# Patient Record
Sex: Female | Born: 1960 | Race: Black or African American | Hispanic: No | Marital: Married | Smoking: Never smoker
Health system: Southern US, Community
[De-identification: ages and names within clinical notes are randomized; demographics above are authoritative.]

## PROBLEM LIST (undated history)

## (undated) DIAGNOSIS — IMO0001 Reserved for inherently not codable concepts without codable children: Secondary | ICD-10-CM

## (undated) DIAGNOSIS — R51 Headache: Secondary | ICD-10-CM

## (undated) DIAGNOSIS — M129 Arthropathy, unspecified: Secondary | ICD-10-CM

## (undated) DIAGNOSIS — D509 Iron deficiency anemia, unspecified: Secondary | ICD-10-CM

## (undated) DIAGNOSIS — G4700 Insomnia, unspecified: Secondary | ICD-10-CM

## (undated) DIAGNOSIS — M545 Low back pain: Secondary | ICD-10-CM

## (undated) DIAGNOSIS — I10 Essential (primary) hypertension: Secondary | ICD-10-CM

## (undated) DIAGNOSIS — J019 Acute sinusitis, unspecified: Secondary | ICD-10-CM

## (undated) DIAGNOSIS — G039 Meningitis, unspecified: Secondary | ICD-10-CM

## (undated) DIAGNOSIS — E559 Vitamin D deficiency, unspecified: Secondary | ICD-10-CM

## (undated) DIAGNOSIS — F329 Major depressive disorder, single episode, unspecified: Secondary | ICD-10-CM

## (undated) DIAGNOSIS — M810 Age-related osteoporosis without current pathological fracture: Secondary | ICD-10-CM

## (undated) DIAGNOSIS — E785 Hyperlipidemia, unspecified: Secondary | ICD-10-CM

## (undated) DIAGNOSIS — R21 Rash and other nonspecific skin eruption: Secondary | ICD-10-CM

## (undated) DIAGNOSIS — G8929 Other chronic pain: Secondary | ICD-10-CM

## (undated) DIAGNOSIS — M2550 Pain in unspecified joint: Secondary | ICD-10-CM

## (undated) DIAGNOSIS — B029 Zoster without complications: Secondary | ICD-10-CM

## (undated) DIAGNOSIS — F411 Generalized anxiety disorder: Secondary | ICD-10-CM

## (undated) HISTORY — DX: Generalized anxiety disorder: F41.1

## (undated) HISTORY — DX: Arthropathy, unspecified: M12.9

## (undated) HISTORY — DX: Reserved for inherently not codable concepts without codable children: IMO0001

## (undated) HISTORY — DX: Iron deficiency anemia, unspecified: D50.9

## (undated) HISTORY — DX: Pain in unspecified joint: M25.50

## (undated) HISTORY — PX: OTHER SURGICAL HISTORY: SHX169

## (undated) HISTORY — DX: Low back pain: M54.5

## (undated) HISTORY — DX: Other chronic pain: G89.29

## (undated) HISTORY — DX: Headache: R51

## (undated) HISTORY — DX: Rash and other nonspecific skin eruption: R21

## (undated) HISTORY — DX: Zoster without complications: B02.9

## (undated) HISTORY — DX: Insomnia, unspecified: G47.00

## (undated) HISTORY — DX: Acute sinusitis, unspecified: J01.90

## (undated) HISTORY — PX: ABDOMINAL HYSTERECTOMY: SHX81

## (undated) HISTORY — DX: Major depressive disorder, single episode, unspecified: F32.9

## (undated) HISTORY — DX: Essential (primary) hypertension: I10

## (undated) HISTORY — DX: Hyperlipidemia, unspecified: E78.5

## (undated) HISTORY — DX: Vitamin D deficiency, unspecified: E55.9

---

## 1998-09-20 ENCOUNTER — Other Ambulatory Visit: Admission: RE | Admit: 1998-09-20 | Discharge: 1998-09-20 | Payer: Self-pay | Admitting: *Deleted

## 1999-01-16 ENCOUNTER — Emergency Department (HOSPITAL_COMMUNITY): Admission: EM | Admit: 1999-01-16 | Discharge: 1999-01-16 | Payer: Self-pay | Admitting: Emergency Medicine

## 1999-01-16 ENCOUNTER — Encounter: Payer: Self-pay | Admitting: Emergency Medicine

## 2000-02-02 ENCOUNTER — Encounter (INDEPENDENT_AMBULATORY_CARE_PROVIDER_SITE_OTHER): Payer: Self-pay | Admitting: Specialist

## 2000-02-02 ENCOUNTER — Inpatient Hospital Stay (HOSPITAL_COMMUNITY): Admission: RE | Admit: 2000-02-02 | Discharge: 2000-02-04 | Payer: Self-pay | Admitting: *Deleted

## 2000-02-14 ENCOUNTER — Encounter: Payer: Self-pay | Admitting: Obstetrics and Gynecology

## 2000-02-14 ENCOUNTER — Inpatient Hospital Stay (HOSPITAL_COMMUNITY): Admission: AD | Admit: 2000-02-14 | Discharge: 2000-02-14 | Payer: Self-pay | Admitting: Obstetrics and Gynecology

## 2000-04-05 ENCOUNTER — Encounter: Payer: Self-pay | Admitting: *Deleted

## 2000-04-05 ENCOUNTER — Ambulatory Visit (HOSPITAL_COMMUNITY): Admission: RE | Admit: 2000-04-05 | Discharge: 2000-04-05 | Payer: Self-pay | Admitting: *Deleted

## 2000-05-14 ENCOUNTER — Encounter: Admission: RE | Admit: 2000-05-14 | Discharge: 2000-05-14 | Payer: Self-pay | Admitting: Family Medicine

## 2000-05-14 ENCOUNTER — Encounter: Payer: Self-pay | Admitting: Family Medicine

## 2000-09-27 ENCOUNTER — Encounter: Admission: RE | Admit: 2000-09-27 | Discharge: 2000-09-27 | Payer: Self-pay | Admitting: Family Medicine

## 2000-09-27 ENCOUNTER — Encounter: Payer: Self-pay | Admitting: Family Medicine

## 2000-09-28 ENCOUNTER — Encounter: Payer: Self-pay | Admitting: Family Medicine

## 2000-12-26 ENCOUNTER — Encounter: Admission: RE | Admit: 2000-12-26 | Discharge: 2000-12-26 | Payer: Self-pay | Admitting: Family Medicine

## 2000-12-26 ENCOUNTER — Encounter: Payer: Self-pay | Admitting: Family Medicine

## 2001-12-12 ENCOUNTER — Encounter: Payer: Self-pay | Admitting: Emergency Medicine

## 2001-12-12 ENCOUNTER — Emergency Department (HOSPITAL_COMMUNITY): Admission: EM | Admit: 2001-12-12 | Discharge: 2001-12-12 | Payer: Self-pay | Admitting: Emergency Medicine

## 2001-12-19 ENCOUNTER — Ambulatory Visit (HOSPITAL_COMMUNITY): Admission: RE | Admit: 2001-12-19 | Discharge: 2001-12-19 | Payer: Self-pay | Admitting: *Deleted

## 2001-12-19 ENCOUNTER — Encounter: Payer: Self-pay | Admitting: *Deleted

## 2002-09-01 ENCOUNTER — Emergency Department (HOSPITAL_COMMUNITY): Admission: EM | Admit: 2002-09-01 | Discharge: 2002-09-01 | Payer: Self-pay | Admitting: Emergency Medicine

## 2002-11-27 HISTORY — PX: OTHER SURGICAL HISTORY: SHX169

## 2003-03-17 ENCOUNTER — Encounter: Payer: Self-pay | Admitting: Emergency Medicine

## 2003-03-17 ENCOUNTER — Emergency Department (HOSPITAL_COMMUNITY): Admission: EM | Admit: 2003-03-17 | Discharge: 2003-03-17 | Payer: Self-pay | Admitting: Emergency Medicine

## 2003-03-26 ENCOUNTER — Encounter: Admission: RE | Admit: 2003-03-26 | Discharge: 2003-03-26 | Payer: Self-pay | Admitting: Internal Medicine

## 2003-03-31 ENCOUNTER — Ambulatory Visit (HOSPITAL_COMMUNITY): Admission: RE | Admit: 2003-03-31 | Discharge: 2003-03-31 | Payer: Self-pay | Admitting: Internal Medicine

## 2003-03-31 ENCOUNTER — Encounter: Payer: Self-pay | Admitting: Internal Medicine

## 2003-04-11 ENCOUNTER — Encounter: Payer: Self-pay | Admitting: Emergency Medicine

## 2003-04-11 ENCOUNTER — Emergency Department (HOSPITAL_COMMUNITY): Admission: EM | Admit: 2003-04-11 | Discharge: 2003-04-11 | Payer: Self-pay | Admitting: Emergency Medicine

## 2003-04-15 ENCOUNTER — Encounter: Admission: RE | Admit: 2003-04-15 | Discharge: 2003-04-15 | Payer: Self-pay | Admitting: Internal Medicine

## 2003-04-21 ENCOUNTER — Encounter: Payer: Self-pay | Admitting: Internal Medicine

## 2003-04-21 ENCOUNTER — Ambulatory Visit (HOSPITAL_COMMUNITY): Admission: RE | Admit: 2003-04-21 | Discharge: 2003-04-21 | Payer: Self-pay | Admitting: Internal Medicine

## 2003-04-24 ENCOUNTER — Encounter: Admission: RE | Admit: 2003-04-24 | Discharge: 2003-04-24 | Payer: Self-pay | Admitting: Internal Medicine

## 2003-05-19 ENCOUNTER — Ambulatory Visit (HOSPITAL_COMMUNITY): Admission: RE | Admit: 2003-05-19 | Discharge: 2003-05-20 | Payer: Self-pay | Admitting: Neurosurgery

## 2003-05-19 ENCOUNTER — Encounter: Payer: Self-pay | Admitting: Neurosurgery

## 2004-07-29 ENCOUNTER — Encounter: Admission: RE | Admit: 2004-07-29 | Discharge: 2004-07-29 | Payer: Self-pay | Admitting: Family Medicine

## 2004-09-26 ENCOUNTER — Emergency Department (HOSPITAL_COMMUNITY): Admission: EM | Admit: 2004-09-26 | Discharge: 2004-09-26 | Payer: Self-pay | Admitting: Emergency Medicine

## 2005-10-27 ENCOUNTER — Encounter: Payer: Self-pay | Admitting: Internal Medicine

## 2005-10-27 LAB — CONVERTED CEMR LAB: Pap Smear: NORMAL

## 2005-11-21 ENCOUNTER — Encounter: Admission: RE | Admit: 2005-11-21 | Discharge: 2005-11-21 | Payer: Self-pay | Admitting: Orthopedic Surgery

## 2005-12-20 ENCOUNTER — Encounter: Admission: RE | Admit: 2005-12-20 | Discharge: 2005-12-20 | Payer: Self-pay | Admitting: Orthopedic Surgery

## 2006-11-21 ENCOUNTER — Encounter: Admission: RE | Admit: 2006-11-21 | Discharge: 2006-11-21 | Payer: Self-pay | Admitting: Internal Medicine

## 2006-11-27 HISTORY — PX: LUMBAR FUSION: SHX111

## 2006-12-26 ENCOUNTER — Encounter (INDEPENDENT_AMBULATORY_CARE_PROVIDER_SITE_OTHER): Payer: Self-pay | Admitting: Specialist

## 2006-12-26 ENCOUNTER — Inpatient Hospital Stay (HOSPITAL_COMMUNITY): Admission: RE | Admit: 2006-12-26 | Discharge: 2006-12-31 | Payer: Self-pay | Admitting: Orthopedic Surgery

## 2007-09-30 ENCOUNTER — Encounter: Payer: Self-pay | Admitting: Internal Medicine

## 2007-10-25 ENCOUNTER — Ambulatory Visit: Payer: Self-pay | Admitting: Internal Medicine

## 2007-10-25 LAB — CONVERTED CEMR LAB
ALT: 32 units/L (ref 0–35)
Bilirubin Urine: NEGATIVE
Bilirubin, Direct: 0.1 mg/dL (ref 0.0–0.3)
CO2: 26 meq/L (ref 19–32)
Creatinine, Ser: 0.8 mg/dL (ref 0.4–1.2)
GFR calc Af Amer: 98 mL/min
Glucose, Bld: 105 mg/dL — ABNORMAL HIGH (ref 70–99)
HCT: 40.7 % (ref 36.0–46.0)
Hemoglobin, Urine: NEGATIVE
Hemoglobin: 14.2 g/dL (ref 12.0–15.0)
Ketones, ur: NEGATIVE mg/dL
MCHC: 34.9 g/dL (ref 30.0–36.0)
MCV: 91 fL (ref 78.0–100.0)
Monocytes Absolute: 0.4 10*3/uL (ref 0.2–0.7)
Monocytes Relative: 6.6 % (ref 3.0–11.0)
Neutrophils Relative %: 45.5 % (ref 43.0–77.0)
Potassium: 4.1 meq/L (ref 3.5–5.1)
RBC: 4.47 M/uL (ref 3.87–5.11)
TSH: 0.97 microintl units/mL (ref 0.35–5.50)
Total Bilirubin: 0.6 mg/dL (ref 0.3–1.2)
Total Protein: 7.7 g/dL (ref 6.0–8.3)
Urobilinogen, UA: 0.2 (ref 0.0–1.0)
VLDL: 11 mg/dL (ref 0–40)

## 2007-10-31 ENCOUNTER — Ambulatory Visit: Payer: Self-pay | Admitting: Internal Medicine

## 2007-10-31 ENCOUNTER — Encounter: Payer: Self-pay | Admitting: Internal Medicine

## 2007-10-31 DIAGNOSIS — F329 Major depressive disorder, single episode, unspecified: Secondary | ICD-10-CM | POA: Insufficient documentation

## 2007-10-31 DIAGNOSIS — M810 Age-related osteoporosis without current pathological fracture: Secondary | ICD-10-CM

## 2007-10-31 DIAGNOSIS — M797 Fibromyalgia: Secondary | ICD-10-CM | POA: Insufficient documentation

## 2007-10-31 DIAGNOSIS — M949 Disorder of cartilage, unspecified: Secondary | ICD-10-CM

## 2007-10-31 DIAGNOSIS — M129 Arthropathy, unspecified: Secondary | ICD-10-CM | POA: Insufficient documentation

## 2007-10-31 DIAGNOSIS — M2550 Pain in unspecified joint: Secondary | ICD-10-CM | POA: Insufficient documentation

## 2007-10-31 DIAGNOSIS — F3289 Other specified depressive episodes: Secondary | ICD-10-CM

## 2007-10-31 DIAGNOSIS — F32A Depression, unspecified: Secondary | ICD-10-CM | POA: Insufficient documentation

## 2007-10-31 DIAGNOSIS — M545 Low back pain, unspecified: Secondary | ICD-10-CM

## 2007-10-31 DIAGNOSIS — D509 Iron deficiency anemia, unspecified: Secondary | ICD-10-CM | POA: Insufficient documentation

## 2007-10-31 DIAGNOSIS — I10 Essential (primary) hypertension: Secondary | ICD-10-CM | POA: Insufficient documentation

## 2007-10-31 DIAGNOSIS — M899 Disorder of bone, unspecified: Secondary | ICD-10-CM | POA: Insufficient documentation

## 2007-10-31 DIAGNOSIS — E785 Hyperlipidemia, unspecified: Secondary | ICD-10-CM

## 2007-10-31 DIAGNOSIS — IMO0001 Reserved for inherently not codable concepts without codable children: Secondary | ICD-10-CM

## 2007-10-31 HISTORY — DX: Iron deficiency anemia, unspecified: D50.9

## 2007-10-31 HISTORY — DX: Low back pain, unspecified: M54.50

## 2007-10-31 HISTORY — DX: Major depressive disorder, single episode, unspecified: F32.9

## 2007-10-31 HISTORY — DX: Other specified depressive episodes: F32.89

## 2007-10-31 HISTORY — DX: Age-related osteoporosis without current pathological fracture: M81.0

## 2007-10-31 HISTORY — DX: Pain in unspecified joint: M25.50

## 2007-10-31 HISTORY — DX: Arthropathy, unspecified: M12.9

## 2007-10-31 HISTORY — DX: Hyperlipidemia, unspecified: E78.5

## 2007-10-31 HISTORY — DX: Essential (primary) hypertension: I10

## 2007-10-31 HISTORY — DX: Reserved for inherently not codable concepts without codable children: IMO0001

## 2007-10-31 LAB — CONVERTED CEMR LAB
Rhuematoid fact SerPl-aCnc: <20 intl units/mL — ABNORMAL LOW (ref 0.0–20.0)
Sed Rate: 32 mm/hr — ABNORMAL HIGH (ref 0–25)

## 2007-11-02 LAB — CONVERTED CEMR LAB: Anti Nuclear Antibody(ANA): POSITIVE — AB

## 2007-11-04 ENCOUNTER — Encounter: Admission: RE | Admit: 2007-11-04 | Discharge: 2007-11-04 | Payer: Self-pay | Admitting: Orthopedic Surgery

## 2008-01-21 ENCOUNTER — Ambulatory Visit: Payer: Self-pay | Admitting: Internal Medicine

## 2008-01-21 LAB — CONVERTED CEMR LAB
ALT: 24 units/L (ref 0–35)
AST: 19 units/L (ref 0–37)
BUN: 6 mg/dL (ref 6–23)
Calcium: 9.2 mg/dL (ref 8.4–10.5)
Creatinine, Ser: 0.7 mg/dL (ref 0.4–1.2)
GFR calc non Af Amer: 95 mL/min
Total Bilirubin: 0.8 mg/dL (ref 0.3–1.2)
Total CHOL/HDL Ratio: 2.4
Triglycerides: 41 mg/dL (ref 0–149)
VLDL: 8 mg/dL (ref 0–40)

## 2008-01-24 ENCOUNTER — Ambulatory Visit: Payer: Self-pay | Admitting: Internal Medicine

## 2008-01-24 ENCOUNTER — Encounter (INDEPENDENT_AMBULATORY_CARE_PROVIDER_SITE_OTHER): Payer: Self-pay | Admitting: *Deleted

## 2008-01-27 ENCOUNTER — Encounter: Payer: Self-pay | Admitting: Internal Medicine

## 2008-02-17 ENCOUNTER — Encounter: Payer: Self-pay | Admitting: Internal Medicine

## 2008-03-19 ENCOUNTER — Encounter: Payer: Self-pay | Admitting: Internal Medicine

## 2008-03-30 ENCOUNTER — Encounter: Payer: Self-pay | Admitting: Internal Medicine

## 2008-04-03 ENCOUNTER — Encounter: Payer: Self-pay | Admitting: Internal Medicine

## 2008-04-25 ENCOUNTER — Encounter: Admission: RE | Admit: 2008-04-25 | Discharge: 2008-04-25 | Payer: Self-pay | Admitting: Rheumatology

## 2008-04-30 ENCOUNTER — Encounter: Payer: Self-pay | Admitting: Internal Medicine

## 2008-10-23 ENCOUNTER — Ambulatory Visit: Payer: Self-pay | Admitting: Internal Medicine

## 2008-10-23 ENCOUNTER — Encounter: Payer: Self-pay | Admitting: Internal Medicine

## 2008-12-11 ENCOUNTER — Ambulatory Visit: Payer: Self-pay | Admitting: Internal Medicine

## 2008-12-11 LAB — CONVERTED CEMR LAB
Alkaline Phosphatase: 55 units/L (ref 39–117)
BUN: 9 mg/dL (ref 6–23)
Bilirubin Urine: NEGATIVE
Bilirubin, Direct: 0.1 mg/dL (ref 0.0–0.3)
CO2: 30 meq/L (ref 19–32)
Cholesterol: 204 mg/dL (ref 0–200)
Eosinophils Relative: 1.1 % (ref 0.0–5.0)
GFR calc Af Amer: 98 mL/min
GFR calc non Af Amer: 81 mL/min
Glucose, Bld: 96 mg/dL (ref 70–99)
HCT: 39 % (ref 36.0–46.0)
Hemoglobin: 13.8 g/dL (ref 12.0–15.0)
Ketones, ur: NEGATIVE mg/dL
Lymphocytes Relative: 38.1 % (ref 12.0–46.0)
Monocytes Absolute: 0.2 10*3/uL (ref 0.1–1.0)
Monocytes Relative: 4.2 % (ref 3.0–12.0)
Platelets: 185 10*3/uL (ref 150–400)
Potassium: 4 meq/L (ref 3.5–5.1)
RBC: 4.31 M/uL (ref 3.87–5.11)
RDW: 11.9 % (ref 11.5–14.6)
Sodium: 141 meq/L (ref 135–145)
Specific Gravity, Urine: 1.015 (ref 1.000–1.03)
Total CHOL/HDL Ratio: 3.3
Triglycerides: 52 mg/dL (ref 0–149)
Urine Glucose: NEGATIVE mg/dL
VLDL: 10 mg/dL (ref 0–40)
WBC: 5.6 10*3/uL (ref 4.5–10.5)
pH: 7 (ref 5.0–8.0)

## 2008-12-15 ENCOUNTER — Ambulatory Visit: Payer: Self-pay | Admitting: Internal Medicine

## 2009-01-06 ENCOUNTER — Ambulatory Visit: Payer: Self-pay | Admitting: Gastroenterology

## 2009-01-25 ENCOUNTER — Telehealth: Payer: Self-pay | Admitting: Gastroenterology

## 2009-01-26 ENCOUNTER — Ambulatory Visit: Payer: Self-pay | Admitting: Gastroenterology

## 2009-01-26 LAB — HM COLONOSCOPY

## 2009-02-09 ENCOUNTER — Ambulatory Visit: Payer: Self-pay | Admitting: Internal Medicine

## 2009-02-10 ENCOUNTER — Encounter: Admission: RE | Admit: 2009-02-10 | Discharge: 2009-02-10 | Payer: Self-pay | Admitting: Internal Medicine

## 2009-02-15 ENCOUNTER — Encounter: Admission: RE | Admit: 2009-02-15 | Discharge: 2009-02-15 | Payer: Self-pay | Admitting: Internal Medicine

## 2009-02-17 ENCOUNTER — Encounter: Admission: RE | Admit: 2009-02-17 | Discharge: 2009-02-17 | Payer: Self-pay | Admitting: Internal Medicine

## 2009-02-26 ENCOUNTER — Encounter: Payer: Self-pay | Admitting: Internal Medicine

## 2009-02-26 ENCOUNTER — Encounter: Admission: RE | Admit: 2009-02-26 | Discharge: 2009-02-26 | Payer: Self-pay | Admitting: Internal Medicine

## 2009-02-26 ENCOUNTER — Encounter (INDEPENDENT_AMBULATORY_CARE_PROVIDER_SITE_OTHER): Payer: Self-pay | Admitting: Radiology

## 2009-02-26 HISTORY — PX: BREAST BIOPSY: SHX20

## 2009-03-01 ENCOUNTER — Encounter: Payer: Self-pay | Admitting: Internal Medicine

## 2009-07-12 ENCOUNTER — Ambulatory Visit: Payer: Self-pay | Admitting: Internal Medicine

## 2009-07-12 DIAGNOSIS — R51 Headache: Secondary | ICD-10-CM | POA: Insufficient documentation

## 2009-07-12 DIAGNOSIS — R519 Headache, unspecified: Secondary | ICD-10-CM | POA: Insufficient documentation

## 2009-07-12 HISTORY — DX: Headache: R51

## 2009-07-12 LAB — CONVERTED CEMR LAB
ALT: 18 units/L (ref 0–35)
Albumin: 3.8 g/dL (ref 3.5–5.2)
Basophils Absolute: 0.4 10*3/uL — ABNORMAL HIGH (ref 0.0–0.1)
Basophils Relative: 4.1 % — ABNORMAL HIGH (ref 0.0–3.0)
CO2: 29 meq/L (ref 19–32)
Calcium: 9.4 mg/dL (ref 8.4–10.5)
Chloride: 106 meq/L (ref 96–112)
Creatinine, Ser: 0.8 mg/dL (ref 0.4–1.2)
Eosinophils Absolute: 0.1 10*3/uL (ref 0.0–0.7)
Glucose, Bld: 106 mg/dL — ABNORMAL HIGH (ref 70–99)
Hemoglobin: 13.9 g/dL (ref 12.0–15.0)
MCHC: 35.6 g/dL (ref 30.0–36.0)
MCV: 91.2 fL (ref 78.0–100.0)
Monocytes Absolute: 0.3 10*3/uL (ref 0.1–1.0)
Neutro Abs: 5 10*3/uL (ref 1.4–7.7)
Potassium: 3.8 meq/L (ref 3.5–5.1)
RBC: 4.29 M/uL (ref 3.87–5.11)
RDW: 12.2 % (ref 11.5–14.6)
Saturation Ratios: 20.1 % (ref 20.0–50.0)
Total Protein: 7.7 g/dL (ref 6.0–8.3)
Vit D, 25-Hydroxy: 24 ng/mL — ABNORMAL LOW (ref 30–89)
Vitamin B-12: 394 pg/mL (ref 211–911)

## 2009-07-13 ENCOUNTER — Telehealth: Payer: Self-pay | Admitting: Internal Medicine

## 2009-07-28 ENCOUNTER — Ambulatory Visit: Payer: Self-pay | Admitting: Internal Medicine

## 2009-07-28 DIAGNOSIS — E559 Vitamin D deficiency, unspecified: Secondary | ICD-10-CM

## 2009-07-28 HISTORY — DX: Vitamin D deficiency, unspecified: E55.9

## 2009-11-09 ENCOUNTER — Ambulatory Visit: Payer: Self-pay | Admitting: Internal Medicine

## 2009-11-09 DIAGNOSIS — B029 Zoster without complications: Secondary | ICD-10-CM

## 2009-11-09 HISTORY — DX: Zoster without complications: B02.9

## 2009-11-27 HISTORY — PX: OTHER SURGICAL HISTORY: SHX169

## 2009-12-20 ENCOUNTER — Ambulatory Visit: Payer: Self-pay | Admitting: Internal Medicine

## 2009-12-20 DIAGNOSIS — J019 Acute sinusitis, unspecified: Secondary | ICD-10-CM

## 2009-12-20 HISTORY — DX: Acute sinusitis, unspecified: J01.90

## 2010-01-13 ENCOUNTER — Ambulatory Visit: Payer: Self-pay | Admitting: Internal Medicine

## 2010-01-13 LAB — CONVERTED CEMR LAB
ALT: 23 units/L (ref 0–35)
Bilirubin Urine: NEGATIVE
Bilirubin, Direct: 0.1 mg/dL (ref 0.0–0.3)
CO2: 30 meq/L (ref 19–32)
Eosinophils Relative: 1.7 % (ref 0.0–5.0)
Glucose, Bld: 98 mg/dL (ref 70–99)
HDL: 68.6 mg/dL (ref >39.00)
MCV: 94.2 fL (ref 78.0–100.0)
Monocytes Absolute: 0.4 10*3/uL (ref 0.1–1.0)
Monocytes Relative: 5.8 % (ref 3.0–12.0)
Neutrophils Relative %: 53.6 % (ref 43.0–77.0)
Platelets: 200 10*3/uL (ref 150.0–400.0)
Potassium: 4.2 meq/L (ref 3.5–5.1)
Sodium: 141 meq/L (ref 135–145)
Specific Gravity, Urine: 1.02 (ref 1.000–1.030)
Total Bilirubin: 0.6 mg/dL (ref 0.3–1.2)
Total CHOL/HDL Ratio: 2
Total Protein, Urine: NEGATIVE mg/dL
VLDL: 10 mg/dL (ref 0.0–40.0)
WBC: 6.5 10*3/uL (ref 4.5–10.5)
pH: 6.5 (ref 5.0–8.0)

## 2010-01-19 ENCOUNTER — Ambulatory Visit: Payer: Self-pay | Admitting: Internal Medicine

## 2010-02-16 ENCOUNTER — Encounter: Admission: RE | Admit: 2010-02-16 | Discharge: 2010-02-16 | Payer: Self-pay | Admitting: Internal Medicine

## 2010-02-16 LAB — HM MAMMOGRAPHY

## 2010-06-02 ENCOUNTER — Ambulatory Visit: Payer: Self-pay | Admitting: Internal Medicine

## 2010-06-02 ENCOUNTER — Telehealth: Payer: Self-pay | Admitting: Internal Medicine

## 2010-06-02 DIAGNOSIS — R21 Rash and other nonspecific skin eruption: Secondary | ICD-10-CM

## 2010-06-02 DIAGNOSIS — G4700 Insomnia, unspecified: Secondary | ICD-10-CM | POA: Insufficient documentation

## 2010-06-02 HISTORY — DX: Rash and other nonspecific skin eruption: R21

## 2010-06-02 HISTORY — DX: Insomnia, unspecified: G47.00

## 2010-12-19 ENCOUNTER — Encounter: Payer: Self-pay | Admitting: Internal Medicine

## 2010-12-27 ENCOUNTER — Other Ambulatory Visit: Payer: Self-pay | Admitting: Internal Medicine

## 2010-12-27 ENCOUNTER — Encounter: Payer: Self-pay | Admitting: Internal Medicine

## 2010-12-27 ENCOUNTER — Ambulatory Visit
Admission: RE | Admit: 2010-12-27 | Discharge: 2010-12-27 | Payer: Self-pay | Source: Home / Self Care | Attending: Internal Medicine | Admitting: Internal Medicine

## 2010-12-27 DIAGNOSIS — F411 Generalized anxiety disorder: Secondary | ICD-10-CM | POA: Insufficient documentation

## 2010-12-27 DIAGNOSIS — R51 Headache: Secondary | ICD-10-CM

## 2010-12-27 HISTORY — DX: Generalized anxiety disorder: F41.1

## 2010-12-27 LAB — LIPID PANEL
Cholesterol: 190 mg/dL (ref 0–200)
HDL: 72.4 mg/dL (ref >39.00)
LDL Cholesterol: 105 mg/dL — ABNORMAL HIGH (ref 0–99)
VLDL: 13 mg/dL (ref 0.0–40.0)

## 2010-12-27 LAB — CBC WITH DIFFERENTIAL/PLATELET
Basophils Absolute: 0 10*3/uL (ref 0.0–0.1)
Eosinophils Absolute: 0.1 10*3/uL (ref 0.0–0.7)
HCT: 39.7 % (ref 36.0–46.0)
Hemoglobin: 13.8 g/dL (ref 12.0–15.0)
Lymphocytes Relative: 42.1 % (ref 12.0–46.0)
Lymphs Abs: 2.1 10*3/uL (ref 0.7–4.0)
MCHC: 34.8 g/dL (ref 30.0–36.0)
MCV: 91.9 fl (ref 78.0–100.0)
Monocytes Relative: 5.9 % (ref 3.0–12.0)
Neutro Abs: 2.5 10*3/uL (ref 1.4–7.7)
Platelets: 214 10*3/uL (ref 150.0–400.0)
RBC: 4.32 Mil/uL (ref 3.87–5.11)
RDW: 13.1 % (ref 11.5–14.6)
WBC: 5 10*3/uL (ref 4.5–10.5)

## 2010-12-27 LAB — URINALYSIS
Bilirubin Urine: NEGATIVE
Hemoglobin, Urine: NEGATIVE
Ketones, ur: NEGATIVE
Total Protein, Urine: NEGATIVE
Urine Glucose: NEGATIVE
pH: 7.5 (ref 5.0–8.0)

## 2010-12-27 LAB — HEPATIC FUNCTION PANEL
AST: 18 U/L (ref 0–37)
Albumin: 3.9 g/dL (ref 3.5–5.2)
Total Bilirubin: 0.6 mg/dL (ref 0.3–1.2)

## 2010-12-27 LAB — BASIC METABOLIC PANEL
Chloride: 104 mEq/L (ref 96–112)
Creatinine, Ser: 0.9 mg/dL (ref 0.4–1.2)
Potassium: 4.1 mEq/L (ref 3.5–5.1)
Sodium: 140 mEq/L (ref 135–145)

## 2010-12-27 NOTE — Assessment & Plan Note (Signed)
Summary: RASH/NWS   Vital Signs:  Patient profile:   50 year old female Height:      64.5 inches Weight:      193.50 pounds BMI:     32.82 O2 Sat:      98 % on Room air Temp:     98.3 degrees F oral Pulse rate:   78 / minute BP sitting:   160 / 100  (left arm) Cuff size:   regular  Vitals Entered By: Zella Ball Ewing CMA Duncan Dull) (June 02, 2010 2:57 PM)  O2 Flow:  Room air CC: Rash/RE   Primary Care Provider:  Corwin Levins MD  CC:  Rash/RE.  History of Present Illness: here to f/u, also with husband - both with rash it seems with severe itch and spreading over the arms, after contact exposure to duaghter with rash last evening; no fever, pain, swelling.   BP at home usually > 140 sbp  Pt denies CP, sob, doe, wheezing, orthopnea, pnd, worsening LE edema, palps, dizziness or syncope  Pt denies new neuro symptoms such as headache, facial or extremity weakness   Lunesta worked well for insomnia - requests refill.  Denies worsening depressive symtpoms or suicidal ideaiton, or panic.  Overall pain level no change.   Problems Prior to Update: 1)  Sinusitis- Acute-nos  (ICD-461.9) 2)  Shingles  (ICD-053.9) 3)  Vitamin D Deficiency  (ICD-268.9) 4)  Headache  (ICD-784.0) 5)  Preventive Health Care  (ICD-V70.0) 6)  Hypertension  (ICD-401.9) 7)  Preventive Health Care  (ICD-V70.0) 8)  Polyarthralgia  (ICD-719.49) 9)  Family History of Alcoholism/addiction  (ICD-V61.41) 10)  Low Back Pain  (ICD-724.2) 11)  Osteopenia  (ICD-733.90) 12)  Fibromyalgia  (ICD-729.1) 13)  Anemia-iron Deficiency  (ICD-280.9) 14)  Hyperlipidemia  (ICD-272.4) 15)  Depression  (ICD-311) 16)  Arthritis  (ICD-716.90) 17)  Routine General Medical Exam@[facility]  (ICD-V70.0)  Medications Prior to Update: 1)  Hydrocodone-Acetaminophen 5-325 Mg Tabs (Hydrocodone-Acetaminophen) .Marland Kitchen.. 1po Q 6 Hrs As Needed Pain 2)  Benazepril Hcl 10 Mg Tabs (Benazepril Hcl) .Marland Kitchen.. 1po Once Daily 3)  Adult Aspirin Ec Low Strength  81 Mg Tbec (Aspirin) .Marland Kitchen.. 1po Once Daily 4)  Lunesta 3 Mg Tabs (Eszopiclone) .... One By Mouth At Bedtime For Insomnia 5)  Ergocalciferol 50000 Unit Caps (Ergocalciferol) .... One By Mouth Weekly 6)  Ibuprofen 800 Mg Tabs (Ibuprofen) .... One By Mouth Three Times A Day As Needed For Pain 7)  Doxycycline Hyclate 100 Mg Caps (Doxycycline Hyclate) .Marland Kitchen.. 1 By Mouth Two Times A Day 8)  Simvastatin 20 Mg Tabs (Simvastatin) .... 1/2 Po Once Daily  Current Medications (verified): 1)  Hydrocodone-Acetaminophen 5-325 Mg Tabs (Hydrocodone-Acetaminophen) .Marland Kitchen.. 1po Q 6 Hrs As Needed Pain 2)  Benazepril Hcl 20 Mg Tabs (Benazepril Hcl) .Marland Kitchen.. 1 By Mouth Once Daily 3)  Adult Aspirin Ec Low Strength 81 Mg Tbec (Aspirin) .Marland Kitchen.. 1po Once Daily 4)  Lunesta 3 Mg Tabs (Eszopiclone) .... One By Mouth At Bedtime For Insomnia 5)  Ergocalciferol 50000 Unit Caps (Ergocalciferol) .... One By Mouth Weekly 6)  Ibuprofen 800 Mg Tabs (Ibuprofen) .... One By Mouth Three Times A Day As Needed For Pain 7)  Simvastatin 20 Mg Tabs (Simvastatin) .... 1/2 Po Once Daily 8)  Prednisone 10 Mg Tabs (Prednisone) .... 3po Qd For 3days, Then 2po Qd For 3days, Then 1po Qd For 3days, Then Stop 9)  Triamcinolone Acetonide 0.1 % Crea (Triamcinolone Acetonide) .... Use Asd Two Times A Day As Needed  Allergies (verified): 1)  ! Morphine 2)  ! Demerol 3)  ! Pcn 4)  ! Lodine 5)  ! * Etodolac  Past History:  Past Medical History: Last updated: 01/19/2010 Depression DJD Hyperlipidemia Anemia-iron deficiency hx of blood transfusion Fibromyalgia Osteopenia Low back pain - chronic CTS low vit D hx of shingles  Past Surgical History: Last updated: 01/19/2010 Hysterectomy s/p diskectomy 2004 - L5 Lumbar fusion - 2008 s/p right foot morton's neuroma jan 2011  Social History: Last updated: 12/15/2008 Never Smoked Alcohol use-yes Married Drug use-no Regular exercise-no jehovah's witness  Risk Factors: Alcohol Use: 0  (07/12/2009) Exercise: no (10/23/2008)  Risk Factors: Smoking Status: never (07/12/2009) Passive Smoke Exposure: no (10/23/2008)  Review of Systems       all otherwise negative per pt -    Physical Exam  General:  alert and overweight-appearing.   Head:  normocephalic and atraumatic.   Eyes:  vision grossly intact, pupils equal, and pupils round.   Ears:  R ear normal and L ear normal.   Nose:  no external deformity and no nasal discharge.   Mouth:  no gingival abnormalities and pharynx pink and moist.   Neck:  supple and no masses.   Lungs:  normal respiratory effort and normal breath sounds.   Heart:  normal rate and no murmur.   Extremities:  no edema, no erythema  Skin:  diffuse erythem pruritic rash to bilat arms c/w contact derm Psych:  not depressed appearing and moderately anxious.     Impression & Recommendations:  Problem # 1:  RASH-NONVESICULAR (ICD-782.1)  Her updated medication list for this problem includes:    Triamcinolone Acetonide 0.1 % Crea (Triamcinolone acetonide) ..... Use asd two times a day as needed treat as above, f/u any worsening signs or symptoms , and prednisone pack for home  Problem # 2:  HYPERTENSION (ICD-401.9)  Her updated medication list for this problem includes:    Benazepril Hcl 20 Mg Tabs (Benazepril hcl) .Marland Kitchen... 1 by mouth once daily to increase the ACE as above, f/ui BP at home and next visit  BP today: 160/100 Prior BP: 142/86 (01/19/2010)  Prior 10 Yr Risk Heart Disease: 3 % (10/23/2008)  Labs Reviewed: K+: 4.2 (01/13/2010) Creat: : 0.7 (01/13/2010)   Chol: 167 (01/13/2010)   HDL: 68.60 (01/13/2010)   LDL: 88 (01/13/2010)   TG: 50.0 (01/13/2010)  Problem # 3:  INSOMNIA (ICD-780.52)  Her updated medication list for this problem includes:    Lunesta 3 mg treat as above, f/u any worsening signs or symptoms   Complete Medication List: 1)  Hydrocodone-acetaminophen 5-325 Mg Tabs (Hydrocodone-acetaminophen) .Marland Kitchen.. 1po q 6 hrs  as needed pain 2)  Benazepril Hcl 20 Mg Tabs (Benazepril hcl) .Marland Kitchen.. 1 by mouth once daily 3)  Adult Aspirin Ec Low Strength 81 Mg Tbec (Aspirin) .Marland Kitchen.. 1po once daily 4)  Zolpidem Tartrate 10 Mg Tabs (Zolpidem tartrate) .Marland Kitchen.. 1 by mouth at bedtime as needed 5)  Ergocalciferol 50000 Unit Caps (Ergocalciferol) .... One by mouth weekly 6)  Ibuprofen 800 Mg Tabs (Ibuprofen) .... One by mouth three times a day as needed for pain 7)  Simvastatin 20 Mg Tabs (Simvastatin) .... 1/2 po once daily 8)  Prednisone 10 Mg Tabs (Prednisone) .... 3po qd for 3days, then 2po qd for 3days, then 1po qd for 3days, then stop 9)  Triamcinolone Acetonide 0.1 % Crea (Triamcinolone acetonide) .... Use asd two times a day as needed  Patient Instructions: 1)  Please take all new  medications as prescribed 2)  increase the benazepril to 20 mg per day 3)  you are given the lunesta refill today 4)  Continue all previous medications as before this visit 5)  Please schedule a follow-up appointment in jan 2012 with CPX labs Prescriptions: TRIAMCINOLONE ACETONIDE 0.1 % CREA (TRIAMCINOLONE ACETONIDE) use asd two times a day as needed  #1 x 1   Entered and Authorized by:   Corwin Levins MD   Signed by:   Corwin Levins MD on 06/02/2010   Method used:   Print then Give to Patient   RxID:   [id number] LUNESTA 3 MG TABS (ESZOPICLONE) One by mouth at bedtime for insomnia  #30 x 2   Entered and Authorized by:   Corwin Levins MD   Signed by:   Corwin Levins MD on 06/02/2010   Method used:   Print then Give to Patient   RxID:   [id number] PREDNISONE 10 MG TABS (PREDNISONE) 3po qd for 3days, then 2po qd for 3days, then 1po qd for 3days, then stop  #18 x 0   Entered and Authorized by:   Corwin Levins MD   Signed by:   Corwin Levins MD on 06/02/2010   Method used:   Print then Give to Patient   RxID:   [id number] LUNESTA 3 MG TABS (ESZOPICLONE) One by mouth at bedtime for insomnia  #430 x 2   Entered and Authorized by:    Corwin Levins MD   Signed by:   Corwin Levins MD on 06/02/2010   Method used:   Print then Give to Patient   RxID:   [id number] BENAZEPRIL HCL 20 MG TABS (BENAZEPRIL HCL) 1 by mouth once daily  #90 x 3   Entered and Authorized by:   Corwin Levins MD   Signed by:   Corwin Levins MD on 06/02/2010   Method used:   Print then Give to Patient   RxID:   [id number]

## 2010-12-27 NOTE — Assessment & Plan Note (Signed)
Summary: CPX/BCBS/#/CD   Vital Signs:  Patient profile:   50 year old female Height:      64 inches Weight:      188 pounds BMI:     32.39 O2 Sat:      96 % on Room air Temp:     98.6 degrees F oral Pulse rate:   71 / minute BP sitting:   142 / 86  (left arm) Cuff size:   regular  Vitals Entered ByZella Ball Ewing (January 19, 2010 11:11 AM)  O2 Flow:  Room air  Preventive Care Screening  Mammogram:    Date:  01/25/2009    Results:  normal      declines flu shot  CC: Adult Physical/RE   Primary Care Provider:  Corwin Levins MD  CC:  Adult Physical/RE.  History of Present Illness: overall doing "about the same" with chornic FMS symptoms, denies worsening depressive symtpoms, suicidal ideation, or panic.  Pt denies CP, sob, doe, wheezing, orthopnea, pnd, worsening LE edema, palps, dizziness or syncope   Pt denies new neuro symptoms such as headache, facial or extremity weakness BP at home usually < 140/90  Problems Prior to Update: 1)  Sinusitis- Acute-nos  (ICD-461.9) 2)  Shingles  (ICD-053.9) 3)  Vitamin D Deficiency  (ICD-268.9) 4)  Headache  (ICD-784.0) 5)  Preventive Health Care  (ICD-V70.0) 6)  Hypertension  (ICD-401.9) 7)  Preventive Health Care  (ICD-V70.0) 8)  Polyarthralgia  (ICD-719.49) 9)  Family History of Alcoholism/addiction  (ICD-V61.41) 10)  Low Back Pain  (ICD-724.2) 11)  Osteopenia  (ICD-733.90) 12)  Fibromyalgia  (ICD-729.1) 13)  Anemia-iron Deficiency  (ICD-280.9) 14)  Hyperlipidemia  (ICD-272.4) 15)  Depression  (ICD-311) 16)  Arthritis  (ICD-716.90) 17)  Routine General Medical Exam@[facility]  (ICD-V70.0)  Medications Prior to Update: 1)  Hydrocodone-Acetaminophen 5-325 Mg Tabs (Hydrocodone-Acetaminophen) .Marland Kitchen.. 1po Q 6 Hrs As Needed Pain 2)  Benazepril Hcl 10 Mg Tabs (Benazepril Hcl) .Marland Kitchen.. 1po Once Daily 3)  Adult Aspirin Ec Low Strength 81 Mg Tbec (Aspirin) .Marland Kitchen.. 1po Once Daily 4)  Lunesta 3 Mg Tabs (Eszopiclone) .... One By Mouth At  Bedtime For Insomnia 5)  Ergocalciferol 50000 Unit Caps (Ergocalciferol) .... One By Mouth Weekly 6)  Ibuprofen 800 Mg Tabs (Ibuprofen) .... One By Mouth Three Times A Day As Needed For Pain 7)  Doxycycline Hyclate 100 Mg Caps (Doxycycline Hyclate) .Marland Kitchen.. 1 By Mouth Two Times A Day 8)  Simvastatin 20 Mg Tabs (Simvastatin) .Marland Kitchen.. 1po Once Daily  Current Medications (verified): 1)  Hydrocodone-Acetaminophen 5-325 Mg Tabs (Hydrocodone-Acetaminophen) .Marland Kitchen.. 1po Q 6 Hrs As Needed Pain 2)  Benazepril Hcl 10 Mg Tabs (Benazepril Hcl) .Marland Kitchen.. 1po Once Daily 3)  Adult Aspirin Ec Low Strength 81 Mg Tbec (Aspirin) .Marland Kitchen.. 1po Once Daily 4)  Lunesta 3 Mg Tabs (Eszopiclone) .... One By Mouth At Bedtime For Insomnia 5)  Ergocalciferol 50000 Unit Caps (Ergocalciferol) .... One By Mouth Weekly 6)  Ibuprofen 800 Mg Tabs (Ibuprofen) .... One By Mouth Three Times A Day As Needed For Pain 7)  Doxycycline Hyclate 100 Mg Caps (Doxycycline Hyclate) .Marland Kitchen.. 1 By Mouth Two Times A Day 8)  Simvastatin 20 Mg Tabs (Simvastatin) .... 1/2 Po Once Daily  Allergies (verified): 1)  ! Morphine 2)  ! Demerol 3)  ! Pcn 4)  ! Lodine 5)  ! * Etodolac  Past History:  Family History: Last updated: 10/31/2007 Family History High cholesterol DJD daughter with ADHD/depression Family History of Alcoholism/Addiction  Social  History: Last updated: 12/15/2008 Never Smoked Alcohol use-yes Married Drug use-no Regular exercise-no jehovah's witness  Risk Factors: Alcohol Use: 0 (07/12/2009) Exercise: no (10/23/2008)  Risk Factors: Smoking Status: never (07/12/2009) Passive Smoke Exposure: no (10/23/2008)  Past Medical History: Depression DJD Hyperlipidemia Anemia-iron deficiency hx of blood transfusion Fibromyalgia Osteopenia Low back pain - chronic CTS low vit D hx of shingles  Past Surgical History: Hysterectomy s/p diskectomy 2004 - L5 Lumbar fusion - 2008 s/p right foot morton's neuroma jan 2011  Review of  Systems  The patient denies anorexia, fever, weight loss, vision loss, decreased hearing, hoarseness, chest pain, syncope, dyspnea on exertion, peripheral edema, prolonged cough, headaches, hemoptysis, abdominal pain, melena, hematochezia, severe indigestion/heartburn, hematuria, incontinence, suspicious skin lesions, difficulty walking, depression, unusual weight change, abnormal bleeding, enlarged lymph nodes, and angioedema.         all otherwise negative per pt  Physical Exam  General:  alert and well-developed.   Head:  normocephalic and atraumatic.   Eyes:  vision grossly intact, pupils equal, and pupils round.   Ears:  R ear normal and L ear normal.   Nose:  no external deformity and no nasal discharge.   Mouth:  no gingival abnormalities and pharynx pink and moist.   Neck:  supple and no masses.   Lungs:  normal respiratory effort and normal breath sounds.   Heart:  normal rate and regular rhythm.   Abdomen:  soft, non-tender, and normal bowel sounds.   Msk:  no joint tenderness and no joint swelling.   Extremities:  no edema, no erythema  Neurologic:  cranial nerves II-XII intact and strength normal in all extremities.     Impression & Recommendations:  Problem # 1:  Preventive Health Care (ICD-V70.0)  Overall doing well, age appropriate education and counseling updated and referral for appropriate preventive services done unless declined, immunizations up to date or declined, diet counseling done if overweight, urged to quit smoking if smokes , most recent labs reviewed and current ordered if appropriate, ecg reviewed or declined (interpretation per ECG scanned in the EMR if done); information regarding Medicare Prevention requirements given if appropriate  Orders: EKG w/ Interpretation (93000)  Problem # 2:  HYPERTENSION (ICD-401.9)  Her updated medication list for this problem includes:    Benazepril Hcl 10 Mg Tabs (Benazepril hcl) .Marland Kitchen... 1po once daily  BP today:  142/86 Prior BP: 154/98 (12/20/2009)  Prior 10 Yr Risk Heart Disease: 3 % (10/23/2008)  Labs Reviewed: K+: 4.2 (01/13/2010) Creat: : 0.7 (01/13/2010)   Chol: 167 (01/13/2010)   HDL: 68.60 (01/13/2010)   LDL: 88 (01/13/2010)   TG: 50.0 (01/13/2010) stable overall by hx and exam, ok to continue meds/tx as is   Complete Medication List: 1)  Hydrocodone-acetaminophen 5-325 Mg Tabs (Hydrocodone-acetaminophen) .Marland Kitchen.. 1po q 6 hrs as needed pain 2)  Benazepril Hcl 10 Mg Tabs (Benazepril hcl) .Marland Kitchen.. 1po once daily 3)  Adult Aspirin Ec Low Strength 81 Mg Tbec (Aspirin) .Marland Kitchen.. 1po once daily 4)  Lunesta 3 Mg Tabs (Eszopiclone) .... One by mouth at bedtime for insomnia 5)  Ergocalciferol 50000 Unit Caps (Ergocalciferol) .... One by mouth weekly 6)  Ibuprofen 800 Mg Tabs (Ibuprofen) .... One by mouth three times a day as needed for pain 7)  Doxycycline Hyclate 100 Mg Caps (Doxycycline hyclate) .Marland Kitchen.. 1 by mouth two times a day 8)  Simvastatin 20 Mg Tabs (Simvastatin) .... 1/2 po once daily  Patient Instructions: 1)  Continue all previous medications as before this visit  2)  Please schedule a follow-up appointment in 1 year or sooner if needed Prescriptions: ERGOCALCIFEROL 50000 UNIT CAPS (ERGOCALCIFEROL) One by mouth weekly  #4 x 11   Entered and Authorized by:   Corwin Levins MD   Signed by:   Corwin Levins MD on 01/19/2010   Method used:   Print then Give to Patient   RxID:   [id number]

## 2010-12-27 NOTE — Progress Notes (Signed)
Summary: ALT med  Phone Note Call from Patient Call back at Home Phone [phone]   Caller: Patient Summary of Call: Pt called stating that Rx for Lunesta is too expensive and does not come in generic. Pt is requesting alt med. *pt did not specifiy pharmacy* Initial call taken by: Margaret Pyle, CMA,  June 02, 2010 4:49 PM  Follow-up for Phone Call        ok to change to zolpidem - done hardcopy to LIM side B - dahlia   Follow-up by: Corwin Levins MD,  June 02, 2010 4:56 PM  Additional Follow-up for Phone Call Additional follow up Details #1::        Rx faxed to [facility] Additional Follow-up by: Margaret Pyle, CMA,  June 02, 2010 4:59 PM    New/Updated Medications: ZOLPIDEM TARTRATE 10 MG TABS (ZOLPIDEM TARTRATE) 1 by mouth at bedtime as needed Prescriptions: ZOLPIDEM TARTRATE 10 MG TABS (ZOLPIDEM TARTRATE) 1 by mouth at bedtime as needed  #30 x 2   Entered and Authorized by:   Corwin Levins MD   Signed by:   Corwin Levins MD on 06/02/2010   Method used:   Print then Give to Patient   RxID:   [id number]

## 2010-12-27 NOTE — Assessment & Plan Note (Signed)
Summary: STREP/SIDE DOOR/NWS   Vital Signs:  Patient profile:   50 year old female Height:      64 inches Weight:      196 pounds BMI:     33.76 O2 Sat:      97 % on Room air Temp:     97.3 degrees F oral Pulse rate:   84 / minute BP sitting:   154 / 98  (left arm) Cuff size:   regular  Vitals Entered ByMarland Kitchen Zella Ball Ewing (December 20, 2009 1:57 PM)  O2 Flow:  Room air CC: sore throat, head congestion/RE   Primary Care Provider:  Corwin Levins MD  CC:  sore throat and head congestion/RE.  History of Present Illness: here with 3 wks gradually worsening ST, fever, with pain to left ear as well;  and headache and vertigo;  no worsening n/v;  has some mild non prod cough seems worse in the evening;  noticed greenish d/c iwth blood this am;  had some chills last wk , none today; Pt denies CP, sob, doe, wheezing, orthopnea, pnd, worsening LE edema, palps, dizziness or syncope  .  Scheduled to have foot surgury tomorrow.  No one else sick at home.    Problems Prior to Update: 1)  Sinusitis- Acute-nos  (ICD-461.9) 2)  Shingles  (ICD-053.9) 3)  Vitamin D Deficiency  (ICD-268.9) 4)  Headache  (ICD-784.0) 5)  Preventive Health Care  (ICD-V70.0) 6)  Hypertension  (ICD-401.9) 7)  Preventive Health Care  (ICD-V70.0) 8)  Polyarthralgia  (ICD-719.49) 9)  Family History of Alcoholism/addiction  (ICD-V61.41) 10)  Low Back Pain  (ICD-724.2) 11)  Osteopenia  (ICD-733.90) 12)  Fibromyalgia  (ICD-729.1) 13)  Anemia-iron Deficiency  (ICD-280.9) 14)  Hyperlipidemia  (ICD-272.4) 15)  Depression  (ICD-311) 16)  Arthritis  (ICD-716.90) 17)  Routine General Medical Exam@[facility]  (ICD-V70.0)  Medications Prior to Update: 1)  Hydrocodone-Acetaminophen 5-325 Mg Tabs (Hydrocodone-Acetaminophen) .Marland Kitchen.. 1po Q 6 Hrs As Needed Pain 2)  Benazepril Hcl 10 Mg Tabs (Benazepril Hcl) .Marland Kitchen.. 1po Once Daily 3)  Adult Aspirin Ec Low Strength 81 Mg Tbec (Aspirin) .Marland Kitchen.. 1po Once Daily 4)  Lunesta 3 Mg Tabs  (Eszopiclone) .... One By Mouth At Bedtime For Insomnia 5)  Ergocalciferol 50000 Unit Caps (Ergocalciferol) .... One By Mouth Weekly 6)  Ibuprofen 800 Mg Tabs (Ibuprofen) .... One By Mouth Three Times A Day As Needed For Pain 7)  Valacyclovir Hcl 1 Gm Tabs (Valacyclovir Hcl) .Marland Kitchen.. 1 By Mouth Three Times A Day  Current Medications (verified): 1)  Hydrocodone-Acetaminophen 5-325 Mg Tabs (Hydrocodone-Acetaminophen) .Marland Kitchen.. 1po Q 6 Hrs As Needed Pain 2)  Benazepril Hcl 10 Mg Tabs (Benazepril Hcl) .Marland Kitchen.. 1po Once Daily 3)  Adult Aspirin Ec Low Strength 81 Mg Tbec (Aspirin) .Marland Kitchen.. 1po Once Daily 4)  Lunesta 3 Mg Tabs (Eszopiclone) .... One By Mouth At Bedtime For Insomnia 5)  Ergocalciferol 50000 Unit Caps (Ergocalciferol) .... One By Mouth Weekly 6)  Ibuprofen 800 Mg Tabs (Ibuprofen) .... One By Mouth Three Times A Day As Needed For Pain 7)  Doxycycline Hyclate 100 Mg Caps (Doxycycline Hyclate) .Marland Kitchen.. 1 By Mouth Two Times A Day 8)  Simvastatin 20 Mg Tabs (Simvastatin) .Marland Kitchen.. 1po Once Daily  Allergies (verified): 1)  ! Morphine 2)  ! Demerol 3)  ! Pcn 4)  ! Lodine 5)  ! * Etodolac  Past History:  Past Medical History: Last updated: 10/31/2007 Depression DJD Hyperlipidemia Anemia-iron deficiency hx of blood transfusion Fibromyalgia Osteopenia Low back  pain - chronic CTS low vit D  Past Surgical History: Last updated: 10/31/2007 Hysterectomy s/p diskectomy 2004 - L5 Lumbar fusion - 2008  Social History: Last updated: 12/15/2008 Never Smoked Alcohol use-yes Married Drug use-no Regular exercise-no jehovah's witness  Risk Factors: Alcohol Use: 0 (07/12/2009) Exercise: no (10/23/2008)  Risk Factors: Smoking Status: never (07/12/2009) Passive Smoke Exposure: no (10/23/2008)  Review of Systems       all otherwise negative per pt -   Physical Exam  General:  alert and well-developed.  , mild ill  Head:  normocephalic and atraumatic.   Eyes:  vision grossly intact, pupils  equal, and pupils round.   Ears:  bialt tms red, sinus tender bilat Nose:  nasal dischargemucosal pallor and mucosal erythema.   Mouth:  pharyngeal erythema and fair dentition.   Neck:  supple and cervical lymphadenopathy.   Lungs:  normal respiratory effort and normal breath sounds.   Heart:  normal rate and regular rhythm.   Extremities:  no edema, no erythema    Impression & Recommendations:  Problem # 1:  SINUSITIS- ACUTE-NOS (ICD-461.9)  Her updated medication list for this problem includes:    Doxycycline Hyclate 100 Mg Caps (Doxycycline hyclate) .Marland Kitchen... 1 by mouth two times a day treat as above, f/u any worsening signs or symptoms   Problem # 2:  HYPERTENSION (ICD-401.9)  Her updated medication list for this problem includes:    Benazepril Hcl 10 Mg Tabs (Benazepril hcl) .Marland Kitchen... 1po once daily to re-start meds today, urged complaicne  BP today: 154/98 Prior BP: 140/90 (11/09/2009)  Prior 10 Yr Risk Heart Disease: 3 % (10/23/2008)  Labs Reviewed: K+: 3.8 (07/12/2009) Creat: : 0.8 (07/12/2009)   Chol: 204 (12/11/2008)   HDL: 61.0 (12/11/2008)   LDL: DEL (12/11/2008)   TG: 52 (12/11/2008)  Problem # 3:  HYPERLIPIDEMIA (ICD-272.4)  ok to try lower dose statin - 20 mg per day simvastatin  Her updated medication list for this problem includes:    Simvastatin 20 Mg Tabs (Simvastatin) .Marland Kitchen... 1po once daily  Labs Reviewed: SGOT: 22 (07/12/2009)   SGPT: 18 (07/12/2009)  Prior 10 Yr Risk Heart Disease: 3 % (10/23/2008)   HDL:61.0 (12/11/2008), 66.1 (01/21/2008)  LDL:DEL (12/11/2008), 82 (19/14/7829)  Chol:204 (12/11/2008), 156 (01/21/2008)  Trig:52 (12/11/2008), 41 (01/21/2008)  Complete Medication List: 1)  Hydrocodone-acetaminophen 5-325 Mg Tabs (Hydrocodone-acetaminophen) .Marland Kitchen.. 1po q 6 hrs as needed pain 2)  Benazepril Hcl 10 Mg Tabs (Benazepril hcl) .Marland Kitchen.. 1po once daily 3)  Adult Aspirin Ec Low Strength 81 Mg Tbec (Aspirin) .Marland Kitchen.. 1po once daily 4)  Lunesta 3 Mg Tabs  (Eszopiclone) .... One by mouth at bedtime for insomnia 5)  Ergocalciferol 50000 Unit Caps (Ergocalciferol) .... One by mouth weekly 6)  Ibuprofen 800 Mg Tabs (Ibuprofen) .... One by mouth three times a day as needed for pain 7)  Doxycycline Hyclate 100 Mg Caps (Doxycycline hyclate) .Marland Kitchen.. 1 by mouth two times a day 8)  Simvastatin 20 Mg Tabs (Simvastatin) .Marland Kitchen.. 1po once daily   Patient Instructions: 1)  Please take all new medications as prescribed 2)  Continue all previous medications as before this visit 3)  Ok to go ahead with foot surgury tomorrow 4)  Please schedule a follow-up appointment as planned in february 2011 Prescriptions: DOXYCYCLINE HYCLATE 100 MG CAPS (DOXYCYCLINE HYCLATE) 1 by mouth two times a day  #20 x 0   Entered and Authorized by:   Corwin Levins MD   Signed by:   Corwin Levins  MD on 12/20/2009   Method used:   Print then Give to Patient   RxID:   [id number] BENAZEPRIL HCL 10 MG TABS (BENAZEPRIL HCL) 1po once daily  #90 x 3   Entered and Authorized by:   Corwin Levins MD   Signed by:   Corwin Levins MD on 12/20/2009   Method used:   Print then Give to Patient   RxID:   [id number] SIMVASTATIN 20 MG TABS (SIMVASTATIN) 1po once daily  #90 x 3   Entered and Authorized by:   Corwin Levins MD   Signed by:   Corwin Levins MD on 12/20/2009   Method used:   Print then Give to Patient   RxID:   [id number]

## 2011-01-02 ENCOUNTER — Inpatient Hospital Stay (HOSPITAL_COMMUNITY): Admission: RE | Admit: 2011-01-02 | Payer: Self-pay | Source: Ambulatory Visit

## 2011-01-04 NOTE — Assessment & Plan Note (Signed)
Summary: HEADACHES OFF AND ON B4 SAT-SINCE SAT CONTINUOUS HEADACHES-D/...   Vital Signs:  Patient profile:   50 year old female Height:      64.5 inches Weight:      177.50 pounds BMI:     30.11 O2 Sat:      98 % on Room air Temp:     98.4 degrees F oral Pulse rate:   71 / minute BP sitting:   122 / 82  (left arm) Cuff size:   regular  Vitals Entered By: Zella Ball Ewing CMA (AAMA) (December 27, 2010 8:25 AM)  O2 Flow:  Room air  Preventive Care Screening     declines dxa for now  CC: Headaches, fatigue/RE, Back Pain   Primary Care Provider:  Corwin Levins MD  CC:  Headaches, fatigue/RE, and Back Pain.  History of Present Illness: here with recurring headache over 4 wks, now nearly every day, has been under more stress lately ( 2 funerals and a baby shower and weather changes), she thinks may be assoc with BP, debilatating; mostly left sided, throbbing and pulsating, not assoc with blurred vision or n/v, but severe, iwth the most recnet HA lasting now the last 3 days , worse to exertion or bending over to pick up something;  assoc iwth some photophobia phonophobia.  Before the last 4 wks HA'swere very infrequent but ongoing for yrs.  No prior MRI or CT head, alhtough does remember an NS telling her one time she had evidence for healed skull fx.  NO hx of sz. Laying down and being still can help the HA somewhat. Overall good compliance with meds, and good tolerability and even sometime even takes her BP meds earlier.  Pt denies CP, worsening sob, doe, wheezing, orthopnea, pnd, worsening LE edema, palps,  or syncope  Pt denies new neuro symptoms such as headache, facial or extremity weakness, though can feel vertiginoous with closing the eyes.   C/o lack of motivation, energy, get up and go, fatige despite taking b12 and cinnamon suppelements.  Takes sleep med which helps, but does have occas snoring, does not feel refreshed on awakening she thiinks due to chronic pain and tossing and turning  at night. Does try to fall asleep during the day, used to take 2 hr nap every afternoon except in the lsat 6 mo since her husband has been home after being laid off she has not been napping as he tends to keep her awake.  No fever, night sweats, loss of appetite or other constitutional symptoms,except has lost some wt ( intentionally) - wt down now 193 to 177 in the past 6 months, with better diet , and trying to walk more - walking for the ministry most days , but can only walk about a 1 square block due to pain in the back and legs, as well as morton's neuroma.  Was working full time 3 yrs ago before the spinal fusion - not since then.  Denies worsening depressive symptoms, suicidal ideation, or panic  Problems Prior to Update: 1)  Anxiety  (ICD-300.00) 2)  Headache  (ICD-784.0) 3)  Insomnia  (ICD-780.52) 4)  Rash-nonvesicular  (ICD-782.1) 5)  Sinusitis- Acute-nos  (ICD-461.9) 6)  Shingles  (ICD-053.9) 7)  Vitamin D Deficiency  (ICD-268.9) 8)  Headache  (ICD-784.0) 9)  Preventive Health Care  (ICD-V70.0) 10)  Hypertension  (ICD-401.9) 11)  Preventive Health Care  (ICD-V70.0) 12)  Polyarthralgia  (ICD-719.49) 13)  Family History of Alcoholism/addiction  (ICD-V61.41) 14)  Low Back Pain  (ICD-724.2) 15)  Osteopenia  (ICD-733.90) 16)  Fibromyalgia  (ICD-729.1) 17)  Anemia-iron Deficiency  (ICD-280.9) 18)  Hyperlipidemia  (ICD-272.4) 19)  Depression  (ICD-311) 20)  Arthritis  (ICD-716.90) 21)  Routine General Medical Exam@health  Care Fac[facility]  (ICD-V70.0)  Medications Prior to Update: 1)  Hydrocodone-Acetaminophen 5-325 Mg Tabs (Hydrocodone-Acetaminophen) .Marland Kitchen.. 1po Q 6 Hrs As Needed Pain 2)  Benazepril Hcl 20 Mg Tabs (Benazepril Hcl) .Marland Kitchen.. 1 By Mouth Once Daily 3)  Adult Aspirin Ec Low Strength 81 Mg Tbec (Aspirin) .Marland Kitchen.. 1po Once Daily 4)  Zolpidem Tartrate 10 Mg Tabs (Zolpidem Tartrate) .Marland Kitchen.. 1 By Mouth At Bedtime As Needed 5)  Ergocalciferol 50000 Unit Caps (Ergocalciferol) .... One By Mouth  Weekly 6)  Ibuprofen 800 Mg Tabs (Ibuprofen) .... One By Mouth Three Times A Day As Needed For Pain 7)  Simvastatin 20 Mg Tabs (Simvastatin) .... 1/2 Po Once Daily 8)  Prednisone 10 Mg Tabs (Prednisone) .... 3po Qd For 3days, Then 2po Qd For 3days, Then 1po Qd For 3days, Then Stop 9)  Triamcinolone Acetonide 0.1 % Crea (Triamcinolone Acetonide) .... Use Asd Two Times A Day As Needed  Current Medications (verified): 1)  Benazepril Hcl 20 Mg Tabs (Benazepril Hcl) .Marland Kitchen.. 1 By Mouth Once Daily 2)  Adult Aspirin Ec Low Strength 81 Mg Tbec (Aspirin) .Marland Kitchen.. 1po Once Daily 3)  Zolpidem Tartrate 10 Mg Tabs (Zolpidem Tartrate) .Marland Kitchen.. 1 By Mouth At Bedtime As Needed 4)  Ergocalciferol 50000 Unit Caps (Ergocalciferol) .... One By Mouth Weekly 5)  Ibuprofen 800 Mg Tabs (Ibuprofen) .... One By Mouth Three Times A Day As Needed For Pain 6)  Simvastatin 20 Mg Tabs (Simvastatin) .... 1/2 Po Once Daily 7)  Triamcinolone Acetonide 0.1 % Crea (Triamcinolone Acetonide) .... Use Asd Two Times A Day As Needed 8)  Tramadol Hcl 50 Mg Tabs (Tramadol Hcl) .Marland Kitchen.. 1 By Mouth Every 6 Hours As Needed 9)  Promethazine Hcl 12.5 Mg Tabs (Promethazine Hcl) .Marland Kitchen.. 1 By Mouth 4 Times Daily As Needed 10)  Sumatriptan Succinate 100 Mg Tabs (Sumatriptan Succinate) .Marland Kitchen.. 1 By Mouth Every Other Day As Needed 11)  Lexapro 20 Mg Tabs (Escitalopram Oxalate) .Marland Kitchen.. 1po Once Daily  Allergies (verified): 1)  ! Morphine 2)  ! Demerol 3)  ! Pcn 4)  ! Lodine 5)  ! 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  Family History: Last updated: 10/31/2007 Family History High cholesterol DJD daughter with ADHD/depression Family History of Alcoholism/Addiction  Social History: Last updated: 12/15/2008 Never Smoked Alcohol use-yes Married Drug use-no Regular exercise-no jehovah's witness  Risk  Factors: Alcohol Use: 0 (07/12/2009) Exercise: no (10/23/2008)  Risk Factors: Smoking Status: never (07/12/2009) Passive Smoke Exposure: no (10/23/2008)  Past Medical History: Depression DJD Hyperlipidemia Anemia-iron deficiency hx of blood transfusion Fibromyalgia Osteopenia Low back pain - chronic CTS low vit D hx of shingles Anxiety  Review of Systems  The patient denies anorexia, fever, vision loss, decreased hearing, hoarseness, chest pain, syncope, dyspnea on exertion, peripheral edema, prolonged cough, headaches, hemoptysis, abdominal pain, melena, hematochezia, severe indigestion/heartburn, hematuria, muscle weakness, suspicious skin lesions, transient blindness, difficulty walking, depression, unusual weight change, abnormal bleeding, enlarged lymph nodes, angioedema, and breast masses.         all otherwise negative per pt -  except for increased anxiety  Physical Exam  General:  alert and overweight-appearing.  Head:  normocephalic and atraumatic.   Eyes:  vision grossly intact, pupils equal, and pupils round.   Ears:  R ear normal and L ear normal.   Nose:  no external deformity and no nasal discharge.   Mouth:  no gingival abnormalities and pharynx pink and moist.   Neck:  supple and no masses.   Lungs:  normal respiratory effort and normal breath sounds.   Heart:  normal rate and no murmur.   Abdomen:  soft, non-tender, and normal bowel sounds.   Msk:  no joint tenderness and no joint swelling, has diffuse trigger points tender to upper and lower back bilat.   Extremities:  no edema, no erythema  Neurologic:  cranial nerves II-XII intact and strength normal in all extremities.   Skin:  color normal and no rashes.   Psych:  not depressed appearing and moderately anxious.     Impression & Recommendations:  Problem # 1:  Preventive Health Care (ICD-V70.0) Overall doing well, age appropriate education and counseling updated, referral for preventive  services and immunizations addressed, dietary counseling and smoking status adressed , most recent labs reviewed I have personally reviewed and have noted 1.The patient's medical and social history 2.Their use of alcohol, tobacco or illicit drugs 3.Their current medications and supplements 4. Functional ability including ADL's, fall risk, home safety risk, hearing & visual impairment  5.Diet and physical activities 6.Evidence for depression or mood disorders The patients weight, height, BMI  have been recorded in the chart I have made referrals, counseling and provided education to the patient based review of the above  Orders: TLB-BMP (Basic Metabolic Panel-BMET) (80048-METABOL) TLB-CBC Platelet - w/Differential (85025-CBCD) TLB-Hepatic/Liver Function Pnl (80076-HEPATIC) TLB-Lipid Panel (80061-LIPID) TLB-TSH (Thyroid Stimulating Hormone) (84443-TSH) TLB-Udip ONLY (81003-UDIP)  Problem # 2:  HEADACHE (ICD-784.0)  The following medications were removed from the medication list:    Hydrocodone-acetaminophen 5-325 Mg Tabs (Hydrocodone-acetaminophen) .Marland Kitchen... 1po q 6 hrs as needed pain Her updated medication list for this problem includes:    Adult Aspirin Ec Low Strength 81 Mg Tbec (Aspirin) .Marland Kitchen... 1po once daily    Ibuprofen 800 Mg Tabs (Ibuprofen) ..... One by mouth three times a day as needed for pain    Tramadol Hcl 50 Mg Tabs (Tramadol hcl) .Marland Kitchen... 1 by mouth every 6 hours as needed    Sumatriptan Succinate 100 Mg Tabs (Sumatriptan succinate) .Marland Kitchen... 1 by mouth every other day as needed hx c/w prob transformed migraine;  will ask for MRI head, refer HA wellness, treat as above, f/u any worsening signs or symptoms , as well as excedrin migraine for lesser HA's  Orders: Radiology Referral (Radiology) Headache Clinic Referral (Headache)  Problem # 3:  ANXIETY (ICD-300.00)  Her updated medication list for this problem includes:    Lexapro 20 Mg Tabs (Escitalopram oxalate) .Marland Kitchen... 1po once  daily  Discussed medication use and relaxation techniques.  treat as above, f/u any worsening signs or symptoms   Problem # 4:  INSOMNIA (ICD-780.52)  Her updated medication list for this problem includes:    Zolpidem Tartrate 10 Mg Tabs (Zolpidem tartrate) .Marland Kitchen... 1 by mouth at bedtime as needed treat as above, f/u any worsening signs or symptoms   Complete Medication List: 1)  Benazepril Hcl 20 Mg Tabs (Benazepril hcl) .Marland Kitchen.. 1 by mouth once daily 2)  Adult Aspirin Ec Low Strength 81 Mg Tbec (Aspirin) .Marland Kitchen.. 1po once daily 3)  Zolpidem Tartrate 10 Mg Tabs (Zolpidem tartrate) .Marland Kitchen.. 1 by mouth at bedtime as needed  4)  Ergocalciferol 50000 Unit Caps (Ergocalciferol) .... One by mouth weekly 5)  Ibuprofen 800 Mg Tabs (Ibuprofen) .... One by mouth three times a day as needed for pain 6)  Simvastatin 20 Mg Tabs (Simvastatin) .... 1/2 po once daily 7)  Triamcinolone Acetonide 0.1 % Crea (Triamcinolone acetonide) .... Use asd two times a day as needed 8)  Tramadol Hcl 50 Mg Tabs (Tramadol hcl) .Marland Kitchen.. 1 by mouth every 6 hours as needed 9)  Promethazine Hcl 12.5 Mg Tabs (Promethazine hcl) .Marland Kitchen.. 1 by mouth 4 times daily as needed 10)  Sumatriptan Succinate 100 Mg Tabs (Sumatriptan succinate) .Marland Kitchen.. 1 by mouth every other day as needed 11)  Lexapro 20 Mg Tabs (Escitalopram oxalate) .Marland Kitchen.. 1po once daily  Other Orders: T-Vitamin D (25-Hydroxy) [phone])   Patient Instructions: 1)  Please take all new medications as prescribed   - rememeber to only take HALF of the lexapro 2)  you can also take Excedrin Migraine OTC for lesser Headaches 3)  You will be contacted about the referral(s) to: Headache wellness center, and Head MRI 4)  Please go to the Lab in the basement for your blood and/or urine tests today 5)  Please call the number on the [facility] for results of your testing  6)  Please schedule a follow-up appointment in 4 months - April 2012 Prescriptions: LEXAPRO 20 MG TABS (ESCITALOPRAM OXALATE)  1po once daily  #90 x 3   Entered and Authorized by:   Corwin Levins MD   Signed by:   Corwin Levins MD on 12/27/2010   Method used:   Print then Give to Patient   RxID:   [id number] LEXAPRO 20 MG TABS (ESCITALOPRAM OXALATE) 1po once daily  #30 x 11   Entered and Authorized by:   Corwin Levins MD   Signed by:   Corwin Levins MD on 12/27/2010   Method used:   Print then Give to Patient   RxID:   [id number] SUMATRIPTAN SUCCINATE 100 MG TABS (SUMATRIPTAN SUCCINATE) 1 by mouth every other day as needed  #9 x 5   Entered and Authorized by:   Corwin Levins MD   Signed by:   Corwin Levins MD on 12/27/2010   Method used:   Print then Give to Patient   RxID:   [id number]    Orders Added: 1)  T-Vitamin D (25-Hydroxy) [32951-88416] 2)  Radiology Referral [Radiology] 3)  Headache Clinic Referral [Headache] 4)  TLB-BMP (Basic Metabolic Panel-BMET) [80048-METABOL] 5)  TLB-CBC Platelet - w/Differential [85025-CBCD] 6)  TLB-Hepatic/Liver Function Pnl [80076-HEPATIC] 7)  TLB-Lipid Panel [80061-LIPID] 8)  TLB-TSH (Thyroid Stimulating Hormone) [84443-TSH] 9)  TLB-Udip ONLY [81003-UDIP] 10)  Est. Patient 40-64 years [60630]

## 2011-02-17 ENCOUNTER — Other Ambulatory Visit: Payer: Self-pay | Admitting: *Deleted

## 2011-02-17 DIAGNOSIS — Z0000 Encounter for general adult medical examination without abnormal findings: Secondary | ICD-10-CM

## 2011-02-20 ENCOUNTER — Other Ambulatory Visit: Payer: Self-pay

## 2011-02-27 ENCOUNTER — Encounter: Payer: Self-pay | Admitting: Internal Medicine

## 2011-02-27 ENCOUNTER — Ambulatory Visit (INDEPENDENT_AMBULATORY_CARE_PROVIDER_SITE_OTHER): Payer: Medicare Other | Admitting: Internal Medicine

## 2011-02-27 VITALS — BP 140/82 | HR 68 | Temp 99.1°F | Ht 64.0 in | Wt 176.4 lb

## 2011-02-27 DIAGNOSIS — R1310 Dysphagia, unspecified: Secondary | ICD-10-CM | POA: Insufficient documentation

## 2011-02-27 DIAGNOSIS — Z0001 Encounter for general adult medical examination with abnormal findings: Secondary | ICD-10-CM | POA: Insufficient documentation

## 2011-02-27 DIAGNOSIS — K219 Gastro-esophageal reflux disease without esophagitis: Secondary | ICD-10-CM | POA: Insufficient documentation

## 2011-02-27 DIAGNOSIS — Z0000 Encounter for general adult medical examination without abnormal findings: Secondary | ICD-10-CM

## 2011-02-27 DIAGNOSIS — I10 Essential (primary) hypertension: Secondary | ICD-10-CM

## 2011-02-27 MED ORDER — LANSOPRAZOLE 30 MG PO CPDR: 30.0000 mg | DELAYED_RELEASE_CAPSULE | Freq: Every day | ORAL | Status: DC

## 2011-02-27 MED ORDER — SUMATRIPTAN SUCCINATE 100 MG PO TABS: 100.0000 mg | ORAL_TABLET | Freq: Once | ORAL | Status: DC | PRN

## 2011-02-27 NOTE — Assessment & Plan Note (Signed)
New assoc with gerd - for GI referral

## 2011-02-27 NOTE — Progress Notes (Signed)
Subjective:    Patient ID: Catherine Gardner, female    DOB: June 26, 1961, 50 y.o.   MRN: [id number]  HPI  Here for wellness and f/u;  Overall doing ok;  Pt denies CP, worsening SOB, DOE, wheezing, orthopnea, PND, worsening LE edema, palpitations, dizziness or syncope.  Pt denies neurological change such as new Headache, facial or extremity weakness.  Pt denies polydipsia, polyuria, or low sugar symptoms. Pt states overall good compliance with treatment and medications, good tolerability, and trying to follow lower cholesterol diet.  Pt denies worsening depressive symptoms, suicidal ideation or panic. No fever, wt loss, night sweats, loss of appetite, or other constitutional symptoms.  Pt states good ability with ADL's, low fall risk, home safety reviewed and adequate, no significant changes in hearing or vision, and occasionally active with exercise.  Sometimes misses the statin. Not taking the ASA due to indigestion. Often has dysphagia to solids, and occasionaly liquids too come back up.  Denies significant GU symtpoms such as dysuria, freq, urgency or abd pain, n/v.  Migraines have overall improved , though did have a particularly bad one last wk, brought on by a particular fragrance.   Stopped the lexapro as she is concerned about wt gain, but does want to re-start the imitrex.  Past Medical History  Diagnosis Date  . SHINGLES 11/09/2009  . VITAMIN D DEFICIENCY 07/28/2009  . HYPERLIPIDEMIA 10/31/2007  . 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 DEPRESSION 10/31/2007  . HYPERTENSION 10/31/2007  . SINUSITIS- ACUTE-NOS 12/20/2009  . ARTHRITIS 10/31/2007  . POLYARTHRALGIA 10/31/2007  . LOW BACK PAIN 10/31/2007  . FIBROMYALGIA 10/31/2007  . OSTEOPENIA 10/31/2007  . INSOMNIA 06/02/2010  . RASH-NONVESICULAR 06/02/2010  . Headache 07/12/2009  . ANXIETY 12/27/2010   Past Surgical History  Procedure Date  . Abdominal hysterectomy   . S/p diskectomy 2004    L5  . Lumbar fusion 2008  . S/p right foot morton's neuroma  Jan. 2011    reports that she has never smoked. She does not have any smokeless tobacco history on file. She reports that she drinks alcohol. She reports that she does not use illicit drugs. family history includes ADD / ADHD in her daughter; Alcohol abuse in her other; Depression in her daughter; and Hyperlipidemia in her other. Allergies  Allergen Reactions  . Etodolac     REACTION: Chest pain  . Meperidine Hcl     REACTION: Hives  . Morphine     REACTION: Hives  . Penicillins     REACTION: Hives    Current Outpatient Prescriptions on File Prior to Visit  Medication Sig Dispense Refill  . benazepril (LOTENSIN) 20 MG tablet Take 20 mg by mouth daily.        . ergocalciferol (VITAMIN D2) 50000 UNITS capsule Take 50,000 Units by mouth once a week.        Marland Kitchen HYDROcodone-acetaminophen (NORCO) 5-325 MG per tablet Take 1 tablet by mouth. One by mouth three times a day as needed for pain       . simvastatin (ZOCOR) 20 MG tablet Take 20 mg by mouth. 1/2 po once daily       . triamcinolone (KENALOG) 0.1 % cream Apply topically 2 (two) times daily.        Marland Kitchen zolpidem (AMBIEN) 10 MG tablet Take 10 mg by mouth at bedtime as needed.        Marland Kitchen aspirin 81 MG tablet Take 81 mg by mouth daily.        . predniSONE (DELTASONE)  10 MG tablet Take 10 mg by mouth. 3 po for 3 days, then 2 po for 3 days, the 1 po for 3 days , then stop         Review of Systems Review of Systems  Constitutional: Negative for diaphoresis, activity change, appetite change and unexpected weight change.  HENT: Negative for hearing loss, ear pain, facial swelling, mouth sores and neck stiffness.   Eyes: Negative for pain, redness and visual disturbance.  Respiratory: Negative for shortness of breath and wheezing.   Cardiovascular: Negative for chest pain and palpitations.  Gastrointestinal: Negative for diarrhea, blood in stool, abdominal distention and rectal pain.  Genitourinary: Negative for hematuria, flank pain and  decreased urine volume.  Musculoskeletal: Negative for myalgias and joint swelling.  Skin: Negative for color change and wound.  Neurological: Negative for syncope and numbness.  Hematological: Negative for adenopathy.  Psychiatric/Behavioral: Negative for hallucinations, self-injury, decreased concentration and agitation.      Objective:   Physical Exam BP 140/82  Pulse 68  Temp(Src) 99.1 F (37.3 C) (Oral)  Ht 5\' 4"  (1.626 m)  Wt 176 lb 6 oz (80.003 kg)  BMI 30.27 kg/m2  SpO2 98% Physical Exam  VS noted Constitutional: Pt is oriented to person, place, and time. Appears well-developed and well-nourished.  HENT:  Head: Normocephalic and atraumatic.  Right Ear: External ear normal.  Left Ear: External ear normal.  Nose: Nose normal.  Mouth/Throat: Oropharynx is clear and moist.  Eyes: Conjunctivae and EOM are normal. Pupils are equal, round, and reactive to light.  Neck: Normal range of motion. Neck supple. No JVD present. No tracheal deviation present.  Cardiovascular: Normal rate, regular rhythm, normal heart sounds and intact distal pulses.   Pulmonary/Chest: Effort normal and breath sounds normal.  Abdominal: Soft. Bowel sounds are normal. There is no tenderness.  Musculoskeletal: Normal range of motion. Exhibits no edema.  Spine tender in the midline lower lumbar - chronic Lymphadenopathy:  Has no cervical adenopathy.  Neurological: Pt is alert and oriented to person, place, and time. Pt has normal reflexes. No cranial nerve deficit.  Skin: Skin is warm and dry. No rash noted.  Psychiatric:  Has  normal mood and affect except some dysphoric today. Behavior is normal.        Assessment & Plan:

## 2011-02-27 NOTE — Assessment & Plan Note (Signed)
Recent worsening, not amenable to OTC meds, and assoc with dysphagia - for PPI, and refer GI

## 2011-02-27 NOTE — Assessment & Plan Note (Signed)
stable overall by hx and exam, most recent lab reviewed with pt, and pt to continue medical treatment as before 

## 2011-02-27 NOTE — Patient Instructions (Addendum)
Take all new medications as prescribed  - the antacid med = generic prevacid Please re-start the Aspirin 81 mg - 1 per day - Coated only You will be contacted regarding the referral for: GI Continue all other medications as before Please return in 6 mo or sooner if needed

## 2011-02-27 NOTE — Assessment & Plan Note (Signed)

## 2011-04-10 ENCOUNTER — Ambulatory Visit (INDEPENDENT_AMBULATORY_CARE_PROVIDER_SITE_OTHER): Payer: Medicare Other | Admitting: Gastroenterology

## 2011-04-10 ENCOUNTER — Encounter: Payer: Self-pay | Admitting: Gastroenterology

## 2011-04-10 DIAGNOSIS — R1310 Dysphagia, unspecified: Secondary | ICD-10-CM

## 2011-04-10 DIAGNOSIS — K219 Gastro-esophageal reflux disease without esophagitis: Secondary | ICD-10-CM

## 2011-04-10 NOTE — Patient Instructions (Addendum)
Heartburn Heartburn is a painful, burning sensation in the chest. It may feel worse in certain positions, such as lying down or bending over. It is caused by stomach acid backing up into the tube that carries food from the mouth down to the stomach (lower esophagus).  CAUSES A number of conditions can cause or worsen heartburn, including:   Pregnancy.   Being overweight (obesity).   A condition called hiatal hernia, in which part or all of the stomach is moved up into the chest through a weakness in the diaphragm muscle.   Alcohol.   Exercise.   Eating just before going to bed.   Overeating.   Medications, including:   Nonsteroidal anti-inflammatory drugs, such as ibuprofen and naproxen.   Aspirin.   Some blood pressure medicines, including beta-blockers, calcium channel blockers, and alpha-blockers.   Nitrates (used to treat angina).   The asthma medication Theophylline.   Certain sedative drugs.   Heartburn may be worse after eating certain foods. These heartburn-causing foods are different for different people, but may include:   Peppers.   Chocolate.   Coffee.   High-fat foods, including fried foods.   Spicy foods.   Garlic, onions.   Citrus fruits, including oranges, grapefruit, lemons and limes.   Food containing tomatoes or tomato products.   Mint.   Carbonated beverages.   Vinegar.  SYMPTOMS  Symptoms may last for a few minutes or a few hours, and can include:  Burning pain in the chest or lower throat.   Bitter taste in the mouth.   Coughing.  DIAGNOSIS If the usual treatments for heartburn do not improve your symptoms, then tests may be done to see if there is another condition present. Possible tests may include:  X-rays.   Endoscopy. This is when a tube with a light and a camera on the end is used to examine the esophagus and the stomach.   Blood, breath, or stool tests may be used to check for bacteria that cause ulcers.   TREATMENT There are a number of non-prescription medicines used to treat heartburn, including:  Antacids.   Acid reducers (also called H-2 blockers).   Proton-pump inhibitors.  HOME CARE INSTRUCTIONS  Raise the head of your bed by putting blocks under the legs.   Eat 2-3 hours before going to bed.   Stop smoking.   Try to reach and maintain a healthy weight.   Do not eat just a few very large meals. Instead, eat many smaller meals throughout the day.   Try to identify foods and beverages that make your symptoms worse, and avoid these.   Avoid tight clothing.   Do not exercise right after eating.  SEEK IMMEDIATE MEDICAL CARE IF YOU:  Have severe chest pain that goes down your arm, or into your jaw or neck.   Feel sweaty, dizzy, or lightheaded.   Are short of breath.   Throw up (vomit) blood.   Have difficulty or pain with swallowing.   Have bloody or black, tarry stools.   Have bouts of heartburn more than three times a week for more than two weeks.  Document Released: 04/01/2009 Document Re-Released: 02/07/2010 [facility] Patient Information 2011 [place], [place]. Your Endoscopy is scheduled on 04/26/2011 at 1:30pm

## 2011-04-10 NOTE — Progress Notes (Signed)
History of Present Illness:  Catherine Gardner is a pleasant 50 year old African American female referred at the request of Dr. Jonny Ruiz for evaluation of dysphagia. She complains of dysphagia to solids which is often accompanied by mid chest discomfort. She also is complaining of some mild postprandial epigastric discomfort. Symptoms have improved since taking Prevacid and recur when she stops the medications. She has frequent pyrosis. She is on no gastric irritants including nonsteroidals.  Screening colonoscopy in 2010 was normal.    Review of Systems: Positive for migraine headaches and body pain attributed to fibromyalgia. Pertinent positive and negative review of systems were noted in the above HPI section. All other review of systems were otherwise negative.    Current Medications, Allergies, Past Medical History, Past Surgical History, Family History and Social History were reviewed in [employer] electronic medical record  Vital signs were reviewed in today's medical record. Physical Exam: General: Well developed , well nourished, no acute distress Head: Normocephalic and atraumatic Eyes:  sclerae anicteric, EOMI Ears: Normal auditory acuity Mouth: No deformity or lesions Lungs: Clear throughout to auscultation Heart: Regular rate and rhythm; no murmurs, rubs or bruits Abdomen: Soft, non tender and non distended. No masses, hepatosplenomegaly or hernias noted. Normal Bowel sounds Rectal:deferred Musculoskeletal: Symmetrical with no gross deformities  Pulses:  Normal pulses noted Extremities: No clubbing, cyanosis, edema or deformities noted Neurological: Alert oriented x 4, grossly nonfocal Psychological:  Alert and cooperative. Normal mood and affect

## 2011-04-10 NOTE — Assessment & Plan Note (Signed)
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  Recommendations #1 continue Prevacid #2 upper endoscopy with dilatation as indicated  Risks, alternatives, and complications of the procedure, including bleeding, perforation, and possible need for surgery, were explained to the patient.  Patient's questions were answered.

## 2011-04-10 NOTE — Assessment & Plan Note (Signed)
Symptoms are improved with Prevacid though they remain. Plan to continue with the same for now

## 2011-04-11 ENCOUNTER — Encounter: Payer: Self-pay | Admitting: Gastroenterology

## 2011-04-14 NOTE — H&P (Signed)
NAME:  Catherine Gardner, Catherine Gardner            ACCOUNT NO.:  [id number]   MEDICAL RECORD NO.:  [id number]           PATIENT TYPE:   LOCATION:                                 FACILITY:   PHYSICIAN:  Lianne Cure, P.A.  DATE OF BIRTH:  06-May-1961   DATE OF ADMISSION:  DATE OF DISCHARGE:                              HISTORY & PHYSICAL   CHIEF COMPLAINT:  Lumbar pain with pain radiating down the left leg  posterior laterally.  She has had previous surgery, a lumbar disc  excision x2.   DRUG ALLERGIES:  1. PENICILLIN.  2. MORPHINE.  3. DEMEROL.  4. LODINE.   CURRENT MEDICATIONS:  1. Norco 10/325 p.r.n. for pain and nausea.  2. Phenergan 25 p.r.n. for pain and nausea.   PAST MEDICAL HISTORY:  1. 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  2. Fibromyalgia.   FAMILY HISTORY:  Stroke, hypertension and cancer.   REVIEW OF SYSTEMS:  She reports no fever.  No chills.  No changes in  vision.  No change in hearing.  No hoarseness.  No chronic cough.  No  shortness of breath.  No bowel or bladder incontinence.  No migraines.  No blackouts.  No seizures.  She does have some depression since being  out of work for such a long time secondary to chronic back pain at this  point.   PAST SURGICAL HISTORY:  1. Tubal ligation.  2. Lumbar diskectomy x2.  3. Hysterectomy.   PHYSICAL EXAMINATION:  VITAL SIGNS:  Her temperature is 97.3.  Pulse 88.  Respirations 18.  Blood pressure 145/85.  GENERAL:  She appears well-developed, well-nourished in chronic pain.  She lies on the examination table for comfort.  Sitting and standing are  difficult for her.  HEENT:  Pupils are equal, round and reactive to light.  NECK:  Supple.  Nontender to palpation.  Active range of motion intact.  CHEST:  Clear to auscultation.  No wheezes.  HEART:  Regular rate and rhythm.  No murmurs.  EXTREMITIES:  Distally, neurovascularly and motor intact.  SKIN:  Clean, dry and intact.   MRI was reviewed with Dr. Nelda Severe and showed L5-S1 lumbar  herniation disc.   PLAN:  Posterior laminectomy revision effusion L5-S1.      Lianne Cure, P.A.     MC/MEDQ  D:  12/21/2006  T:  12/22/2006  Job:  [id number]

## 2011-04-14 NOTE — Discharge Summary (Signed)
[facility]  Patient:    Catherine Gardner, Catherine Gardner                     MRN: [id number] Adm. Date:  [id number] Disc. Date: [id number] Attending:  Pleas Koch                           Discharge Summary  ADMISSION DIAGNOSES:          1. Symptomatic fibroid uterus.                               2. Cystocele.                               3. Rectocele.                               4. Pelvic relaxation.  DISCHARGE DIAGNOSES:          1. Symptomatic fibroid uterus.                               2. Cystocele.                               3. Rectocele.                               4. Pelvic relaxation.  BRIEF HISTORY:                A 50 year old black female who was admitted on March 8, and underwent a laparoscopically-assisted vaginal hysterectomy with anterior and posterior colporrhaphy and perineorrhaphy under general anesthesia with estimated blood loss of 250 cc.  The patient had symptomatic fibroids and pelvic prolapse  which was symptomatic.  Operation was uneventful.  Postoperatively, the patient  felt well and had some itching from the PCA which was stopped.  Postoperative hemoglobin 10.9, preoperative 13.5.  Abdomen was soft and nontender.  She was advanced to regular food and passed flatus on the first day.  She voided without difficulty.  She was ambulating and was not dizzy.  The patient did well and was ready to be discharged on the second day.  Her abdominal laparoscopic port incisions were clean and dry and she was having minimal vaginal discharge.  She was discharged home in good condition with prescriptions for Tylox and Motrin.  She  will follow up in the office in two weeks.  She was given detailed instructions to call for fever, heavy bleeding, and severe abdominal pain. DD:  02/04/00 TD:  02/05/00 Job: [id number]

## 2011-04-14 NOTE — Discharge Summary (Signed)
NAME:  Catherine Gardner, Catherine Gardner            ACCOUNT NO.:  [id number]   MEDICAL RECORD NO.:  [id number]          PATIENT TYPE:  INP   LOCATION:  5038                         FACILITY:  [facility]   PHYSICIAN:  Nelda Severe, MD      DATE OF BIRTH:  1961-01-09   DATE OF ADMISSION:  12/26/2006  DATE OF DISCHARGE:  12/31/2006                               DISCHARGE SUMMARY   PREOPERATIVE DIAGNOSIS:  Recurrent L5-S1 disc herniation left side,  second recurrence.   POSTOPERATIVE DIAGNOSIS:  Revision laminectomy L5-S1 with disc excision,  fusion L5-S1 with iliac crest bone graft.   BRIEF HISTORY OF STAY:  On December 26, 2006, patient was taken to the OR  by Dr. Nelda Severe for a posterior lumbar laminectomy with fusion L5-  S1 on the left.  Patient did well.  Blood loss was 400 mL.  Postoperatively, patient stated left leg felt much better.  She was  grossly neurovascular motor intact bilateral lower extremities.  Postoperative day one, hemoglobin 11.5, hematocrit 32.8, all other labs  stable.  She was afebrile with a T-max of 99.3.  We did start ferrous  sulfate secondary to she will not receive blood products which she took  three tablets at 325 daily.  Postop day two, mobility and pain were an  issue, but patient was doing well, saturations 100, hemoglobin at 10.7,  hematocrit at 30.9 stable, afebrile, drain output 25 mL as well as 45  mL, Foley was discontinued, IV rate was decreased, drain was D/C'd on  February 2 and PCA was discontinued.  Hemoglobin was stable at 10.4,  white count at 10.1, other labs normal.  She was afebrile, mobility was  increased, chest was clear, positive bowel sounds, diet was advanced,  dressing changed, incision site was clean, dry and intact.  Postoperative day number five, patient is stable on a regular diet,  incision site was clean, dry and intact, left lower extremity pain  significantly diminished from preoperative, calves are soft and  nontender to palpation,  grossly neurovascular motor intact distally, L4-  S1 bilateral lower extremities equal.   LABS PREOPERATIVELY:  White count 5.4, hemoglobin 14.5, hematocrit 41.4,  these labs were drawn on December 21, 2006, PT was 13.7, INR 1.0, sodium  136, potassium 3.6, BUN 4, creatinine 0.71.   Chest x-ray preoperatively:  I do not have a record of it in the chart.   An ECG was done which shows normal sinus rhythm, this was confirmed by  Dr. Corliss Marcus.   DISCHARGE DIAGNOSIS:  Status post L5-S1 left-side laminectomy revision,  disc excision with fusion and iliac crest graft from right side,  insertion of pedicle screws L5-S1.   DISPOSITION:  Stable.   DIET:  Regular.   FOLLOWUP:  Three to four weeks with Dr. Nelda Severe in our office.   We will send her home with Norco 10/325, she can take up to two every  four hours p.r.n. for pain control, a script was written for 100  tablets.  She may shower, walk for activity.      Lianne Cure, P.A.  Nelda Severe, MD  Electronically Signed    MC/MEDQ  D:  12/31/2006  T:  12/31/2006  Job:  [id number]

## 2011-04-14 NOTE — Op Note (Signed)
NAME:  Catherine Gardner, Catherine Gardner NO.:  [id number]   MEDICAL RECORD NO.:  [id number]          PATIENT TYPE:  INP   LOCATION:  5038                         FACILITY:  [facility]   PHYSICIAN:  Nelda Severe, MD      DATE OF BIRTH:  June 18, 1961   DATE OF PROCEDURE:  12/26/2006  DATE OF DISCHARGE:                               OPERATIVE REPORT   SURGEON:  Nelda Severe, MD.   ASSISTANT:  Lianne Cure, P.A.-C.   PREOPERATIVE DIAGNOSIS:  Recurrent L5-S1 disk herniation left side -  second recurrence.   POST PROCEDURE DIAGNOSIS:  Recurrent L5-S1 disk herniation left side -  second recurrence.   PROCEDURE:  Revision laminectomy L5-S1 with revision disk excision;  posterolateral fusion L5-S1; harvest posterior iliac crest graft right  side; insertion pedicle screws L5-S1.   The patient was placed under general endotracheal anesthesia.  One gram  of vancomycin was then administered intravenously.  Foley catheter was  placed in the bladder.  The bilateral sequential compression devices  were applied.  The patient was positioned prone on the [place] frame.  Care was taken to position the upper extremities so as to avoid  hyperflexion and abduction of the shoulders so as to avoid hyperflexion  of the elbows.  Both upper extremities were padded from axilla to hands  with foam.  The thighs, knees, shins and feet were padded with pillows.   The lumbar area was prepped with DuraPrep and draped in rectangular  fashion.  A midline incision was made into the dermis.  The subcutaneous  tissue was then injected with 1% lidocaine and 0.25% Marcaine with  epinephrine.  Dissection was carried down onto the spinous processes.  Localizing radiographs were taken.   The L5-S1 interspace was identified.  The paraspinal muscle and scar was  dissected away and the ala of the sacrum exposed bilaterally as well as  the tips of the transverse process of L5.  The wound was quite deep.   We curetted  away laminectomy membrane from the bony edges on the left  side.  A further laminectomy at L5 on the left was carried out  proximally and a facetectomy carried out on the left side.  The sacral  lamina was then removed for about 5 mm so that I could get a Penfield 4  into the spinal canal and palpate the medial edge of the sacral pedicle.  I was then able to dissect enough scar away from the disk to mobilize  the S1 nerve root origin.  I had already identified the exiting L5 nerve  root above.  I was able to visualize a small fragment of disk extruding  out of the mass of scar, and in fact during the facetectomy I had felt  that I was probably removing some fragments of free disk, but I could  not identify their origin at that time.   Ultimately I perforated the annulus of the disk more laterally with the  [facility] #4 but the disk space was so narrow posteriorly, it would not  admit an Epstein curette.  Therefore I deferred further disk excision  until I had the pedicle screws in and could distracted the disk space a  little low later in the procedure.   In the meantime we placed pedicle holes at S1-L5 bilaterally.  This was  done in the usual fashion by removing a piece of the base of the  superior articular process, identifying the posterior pedicle,  perforating with an awl, using a 3.5 mm drill bit to make a hole through  the pedicle and into the vertebral body.  The holes were injected with  FloSeal and radiopaque markers placed and lateral radiograph taken.   We then harvested a right posterior iliac crest graft through a separate  incision made just lateral to the iliac crest.  The adipose layer was  quite thick, at least an inch and a half at this point.  The posterior  crest was identified and the ileal and gluteal fascia cleared off the  superior aspect.  The superior aspect of the crest was then fenestrated  with a Leksell rongeur and curettes used to remove a moderate  quantity  of graft from between the tables.  This was then mixed with local graft  which we had previously harvested.  This was then mixed with bone marrow  which I aspirated from the left posterior iliac crest through a 18-gauge  needle.  The iliac and lumbar fascia were then reapposed with a single  #1 figure-of-eight Vicryl suture.   We next decorticated the ala of the sacrum and the transverse process of  L5 bilaterally and packed in equal amounts of graft on either side.  The  screws were then placed.  These were 7 mm screws at S1 and 6 mm screws  at L5.  The length was based on the depth measurements for the holes and  doubly checked just prior to placing the screws.  Each hole had been  circumferentially palpated with a ball-tip probe.   Prior to hooking up the rods, each screw was stimulated and EMG activity  distally recorded.  In each instance the levels of current to stimulate  were well above the threshold level, making it highly unlikely that  there was any contact between the screw and any nerve root.   Once the screws had been placed, a rod was placed on the right side and  provisionally attached and one on the left side provisionally attached  and the disk space distracted and the set screws tightened a little.  This held the disk space open.  I was able to further curette the disk  space and remove a small quantity of degenerate disk from the disk  space.  The floor the spinal canal was explored with a nerve hook and I  could not find any additional free fragments.  The S1 nerve root  appeared free and as noted,  I had already inspected the L5 nerve root  exiting above and it did not look as though it was under pressure  anyway.   The distraction was then let off the left-sided screw rod complex.  Radiographs were taken which showed satisfactory position of screws.  We  then torqued all the screw rod couplings.  We then placed a deep drain which was secured with the  2-0 nylon suture  in the skin.  This was a Arboriculturist drain.  It was brought out  through the skin to the right side.   The lumbar fascia was then closed using continuous #1 Vicryl suture.   The subcutaneous  eighth inch Hemovac drain was then placed and the  subcutaneous layer closed with interrupted and continuous 2-0 Vicryl  suture.  The skin was closed using subcuticular 3-0 Vicryl undyed in  subcuticular fashion.  The skin edges were reinforced with Steri-Strips.  Antibiotic ointment and dressing was applied and secured with OpSite.   The patient was then placed in her bed and brought to the recovery room  awake.  In the recovery room she had full active motion of both feet and  ankles.  Subsequently, she stated that her left leg pain was decreased.   The blood loss estimated at about 300-400 mL.  There were no  intraoperative complications.  The sponge and needle counts were  correct.      Nelda Severe, MD  Electronically Signed     MT/MEDQ  D:  12/26/2006  T:  12/26/2006  Job:  [id number]

## 2011-04-14 NOTE — H&P (Signed)
[facility]  Patient:    Catherine Gardner, Catherine Gardner                     MRN: [id number] Adm. Date:  [id number] Attending:  Pleas Koch                         History and Physical  NOTE:                         This was previously dictated on February 01, 2000, #[id number], but was not retrievable.  ADMISSION DIAGNOSES:          1. Symptomatic fibroids.                               2. Pelvic relaxation.                               3. Cystocele and rectocele.  BRIEF HISTORY:                This is a 50 year old, gravida 2, para 2, with symptomatic enlarging fibroids approximately 8 to 10 weeks size which cause pressure and pain.  Periods were regular.  She also had pressure symptoms from cystocele and rectocele and vaginal relaxation.  She will be admitted for laparoscopically-assisted vaginal hysterectomy and anterior and posterior colporrhaphy with perineorrhaphy.  PAST MEDICAL HISTORY:         The patient is healthy. DD:  02/04/00 TD:  02/04/00 Job: [id number]

## 2011-04-14 NOTE — Op Note (Signed)
[facility]  Patient:    Catherine Gardner, Catherine Gardner                     MRN: [id number] Proc. Date: 02/02/00 Adm. Date:  [id number] Attending:  Pleas Koch                           Operative Report  PREOPERATIVE DIAGNOSIS:       Symptomatic fibroid uterus, cystocele, rectocele, and relaxed vaginal outlet.  POSTOPERATIVE DIAGNOSIS:      Symptomatic fibroid uterus, cystocele, rectocele, and relaxed vaginal outlet.  OPERATION:                    Laparoscopically-assisted vaginal hysterectomy, anterior and posterior colporrhaphy, perineorrhaphy.  SURGEON:                      Georgina Peer, M.D.  ASSISTANT:                    Henreitta Leber, P.A.  ANESTHESIA:  ESTIMATED BLOOD LOSS:         250 cc.  URINE OUTPUT:                 300 cc.  COMPLICATIONS:                None.  INDICATIONS:                  This is a 50 year old African-American female with a long history of fibroids which were growing causing pain and pressure.  The patient also had symptomatic cystocele and rectocele without urinary stress incontinence. She was admitted for correction of these problems.  DESCRIPTION OF PROCEDURE:     The patient was taken to the operating room and given general anesthesia, and placed in the dorsal lithotomy position with legs in Allen stirrups.  The abdomen, perineum, vagina, and urethra were sterilely cleansed.  Catheter emptied the bladder.  Bimanual examination revealed a 6 to 8 weeks size uterus, mobile, and no adnexal masses.  A vertical subumbilical incision was made. 2.5 liters of carbon dioxide gas insufflated creating a pneumoperitoneum.  The laparoscopic trocar and sleeve were placed.  The patient then had under direct vision, right and left midquadrant ports 5 mm in width and a suprapubic 5 mm port placed.  The following pelvic findings were noted.  There appeared to be no injury from laparoscopic trocar port placements.   The uterus was irregular with fibroids mostly located in the fundus.  The tubes and ovaries appeared normal.  The distal portion of the right tube had been previously surgically removed and there was evidence of previous tubal ligation.  Using a 5 mm tripolar coagulator and blade, the left tube was divided.  The left utero-ovarian ligament and vessels were divided.  The left round ligament was divided and an anterior bladder flap was created.  After that the right tube was divided at its entry into the uterus. he right utero-ovarian ligament divided, the right round ligament divided and the peritoneum anteriorly was divided to complete the bladder flap.  There was minimal bleeding and no active bleeding.  At this point, the laparoscopic portion of the procedure was completed.  The vaginal portion was then begun.  Using retractors, a dilute neosynephrine solution of 20 units in 90 cc of normal saline was then injected, a total of 17 cc around the  cervix.  The cervix was then incised. The mucosa pushed off the submucosa.  The cul-de-sac was entered bluntly.  The uterosacral ligaments bilaterally clamped, cut, and suture ligated. Anteriorly, the bladder peritoneum was identified by pushing the submucosa back and the bladder was lifted superiorly and anteriorly with a retractor.  The uterine vessels were bilaterally clamped, cut, and suture ligated.  The remaining tissue supports were then clamped, cut, and suture ligated.  The uterus was completely freed from all of its ports and then removed from the operative field.  Inspection of the vaginal  cuff for any bleeders was then undertaken.  Small bleeders were suture ligated.  The posterior cuff between the uterosacrals was whipstitched.  The posterior cuff was then closed with interrupted sutures of Vicryl.  The anterior portion of the cuff was left open to proceed with the anterior colporrhaphy.  Allis clamps were identified.   The apex of the vaginal cuff was injected with 5 cc of neosynephrine solution.  The mucosa was then incised vertically up to approximately 3 cm from the urethral meatus.  The mucosa was dissected off the submucosa.  The fascia was brought together with figure-of-eight sutures.  The excess vaginal mucosa was trimmed and the mucosa closed with Vicryl suture.  The posterior colporrhaphy was performed by making an inverted triangle incision in the skin and removing this  portion of skin off the perineal body.  A vertical midline incision through the  mucosa was then made with scissors and Allis clamps held the edges.  The mucosa was dissected off the submucosa to the top of the rectocele.  The figure-of-eight sutures and pursestring sutures were used to reduce the rectocele.  The levator  muscles were identified and brought together in the midline for support with chromic suture.  The excess vaginal mucosa was trimmed and the mucosa was then closed with a running suture of Vicryl.  A perineorrhaphy was completed by bringing together the perineal body with sutures of Vicryl and followed by a subcutaneous and subcuticular stitch to close the skin.  The vagina was packed with two inch  plain gauze with Estrace cream.  Foley catheter was placed and clear urine was noted.  The laparoscopic ports were then reinsufflated.  The intraperitoneal sites were inspected and found to be without bleeding.  The ovaries appeared normal.  There appeared to be no injury and no active bleeding.  Irrigation removed any blood and debris and then was suctioned free.  The ports were then removed. The incisions were injected with 0.25% Marcaine a total of 10 cc and the ports were  then closed with 4-0 Dexon after the air was allowed to escape.  Sponge, needle, and instrument counts were correct.  The patient did receive 1 gram of Ancef preoperatively and she did well.  She was taken to the recovery room in  stable condition. DD:  02/02/00 TD:  02/04/00 Job: [id number]

## 2011-04-14 NOTE — Op Note (Signed)
NAME:  ITSEL, OPFER                      ACCOUNT NO.:  [id number]   MEDICAL RECORD NO.:  [id number]                   PATIENT TYPE:  OIB   LOCATION:  3011                                 FACILITY:  [facility]   PHYSICIAN:  Danae Orleans. Venetia Maxon, M.D.               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   OPERATION PERFORMED:  Redo L5-S1 microdiskectomy with microdissection.   SURGEON:  Danae Orleans. Venetia Maxon, M.D.   ASSISTANT:  Stefani Dama, M.D.   ANESTHESIA:  General endotracheal.   ESTIMATED BLOOD LOSS:  Minimal.   COMPLICATIONS:  None.   DISPOSITION:  Recovery.   INDICATIONS FOR PROCEDURE:  Catherine Gardner is a 50 year old woman who had  previously undergone L5-S1 diskectomy by Dr. Darien Ramus in 1989.  She did  well until recently when she developed severe left lower extremity pain and  an MRI demonstrated a recurrent disk herniation at the L5-S1 level on the  left.  She had significant weakness and considerable pain and it was elected  to take her to surgery for microdiskectomy.   DESCRIPTION OF PROCEDURE:  Ms. Petrakis was brought to the operating room.  Following the satisfactory and uncomplicated induction of general  endotracheal anesthesia and placement of intravenous lines, the patient was  placed in a prone position on the Wilson frame.  Her low back was then  shaved, prepped and draped in the usual sterile fashion.  The area of  planned incision was infiltrated with 0.25% Marcaine, 0.5% lidocaine,  1:200,000 epinephrine.  The inferior half of the previous scar was excised  and dissection was carried  through copious adipose tissue to the lumbodorsal  fascia on the left side of midline and this was incised with electrocautery.  Subperiosteal dissection was then performed exposing the L5-S1 interspace  and intraoperative x-ray confirmed correct level.  A self-retaining  retractor was placed to facilitate exposure.  Using a small straight curet,  the scar tissue was removed overlying the L5 lamina and the superior aspect  of the S1 lamina.  The high speed drill with an M-8 equivalent bur was then  used to thin the L5 lamina as well as the medial facet of L5-S1.  The bone  removal was completed with a variety of Kerrison rongeurs.  The microscope  was brought into the field and using microdissection technique, the lateral  aspect of the spinal canal was identified.  The S1 nerve root was  identified.  Using micro hooks very carefully clearing scar tissue, free  fragment of herniated disk material was identified which was directly  beneath the S1 nerve  root.  Fragments of disk material were removed,  resulting in significant decompression of the nerve root. The interspace was  then incised and disk material was removed in piecemeal fashion.  The end  plates were stripped of residual disk material.  The medial aspect of the  disk space was cleared of residual disk material and the floor of the canal  was palpated and felt to be cleared of residual disk herniation.  The S1  nerve root was palpated as it extended out the neural foramen and was felt  to be well decompressed.  Hemostasis was assured.  The wound was copiously  irrigated with bacitracin saline.  A self-retaining retractor was removed.  The lumbodorsal fascia was closed with 0 Vicryl suture.  The subcutaneous  tissue were reapproximated with 2-0 Vicryl interrupted inverted sutures and  the skin edges were reapproximated with interrupted 3-0 Vicryl subcuticular  stitch.  The wound was dressed with DermaBond.  The patient was extubated  in  the operating room and taken to the recovery room in stable and satisfactory  condition having tolerated the operation well.  Counts were correct at the  end of the case.                                                Danae Orleans. Venetia Maxon, M.D.    JDS/MEDQ  D:  05/19/2003  T:  05/20/2003  Job:  [id number]

## 2011-04-26 ENCOUNTER — Ambulatory Visit (AMBULATORY_SURGERY_CENTER): Payer: Medicare Other | Admitting: Gastroenterology

## 2011-04-26 ENCOUNTER — Encounter: Payer: Self-pay | Admitting: Gastroenterology

## 2011-04-26 VITALS — BP 161/100 | HR 78 | Temp 97.0°F | Resp 16

## 2011-04-26 DIAGNOSIS — R079 Chest pain, unspecified: Secondary | ICD-10-CM

## 2011-04-26 DIAGNOSIS — K222 Esophageal obstruction: Secondary | ICD-10-CM

## 2011-04-26 DIAGNOSIS — R1310 Dysphagia, unspecified: Secondary | ICD-10-CM

## 2011-04-26 NOTE — Patient Instructions (Signed)
Per Dr. Arlyce Dice esophageal stricture with dilatation.  Otherwise normal exam.  Follow dilatation diet today.  Copy of diet given to your care partner.  Continue your acid reducing medication (PPI).  Please call the office in next couple of days to set up follow up appointment with Dr. Arlyce Dice for 6 weeks.  Resume your prior medications today.  Call if any questions or concerns.

## 2011-04-26 NOTE — Progress Notes (Signed)
No complaints on d/c. MAW

## 2011-04-27 ENCOUNTER — Telehealth: Payer: Self-pay

## 2011-04-27 NOTE — Telephone Encounter (Signed)

## 2011-05-08 ENCOUNTER — Other Ambulatory Visit: Payer: Self-pay | Admitting: Internal Medicine

## 2011-05-09 NOTE — Telephone Encounter (Signed)
Faxed hardcopy to pharmacy. 

## 2011-06-06 ENCOUNTER — Ambulatory Visit (INDEPENDENT_AMBULATORY_CARE_PROVIDER_SITE_OTHER): Payer: Medicare Other | Admitting: Gastroenterology

## 2011-06-06 ENCOUNTER — Encounter: Payer: Self-pay | Admitting: Gastroenterology

## 2011-06-06 DIAGNOSIS — R1013 Epigastric pain: Secondary | ICD-10-CM

## 2011-06-06 DIAGNOSIS — R109 Unspecified abdominal pain: Secondary | ICD-10-CM

## 2011-06-06 DIAGNOSIS — K3189 Other diseases of stomach and duodenum: Secondary | ICD-10-CM

## 2011-06-06 NOTE — Progress Notes (Signed)
History of Present Illness:  Catherine Gardner has returned for followup of her dysphagia. Since her dilation in March, 2012 dysphagia has subsided. Her main complaint is early satiety and postprandial fullness. She denies nausea, per se. As long as she takes her Prevacid she does not have pyrosis.    Review of Systems: Pertinent positive and negative review of systems were noted in the above HPI section. All other review of systems were otherwise negative.    Current Medications, Allergies, Past Medical History, Past Surgical History, Family History and Social History were reviewed in [employer] electronic medical record  Vital signs were reviewed in today's medical record. Physical Exam: General: Well developed , well nourished, no acute distress  Abdomen: Soft, non tender and non distended. No masses, hepatosplenomegaly or hernias noted. Normal Bowel sounds; there is no succussion splash Rectal:deferred

## 2011-06-06 NOTE — Patient Instructions (Signed)
Your Gastric Emptying Scan is scheduled on 06/21/2011 at 10am at [facility] Radiology Department You will need to hold your Prilosec 24 hours prior to your test Please arrive at your test 15 minutes early

## 2011-06-06 NOTE — Assessment & Plan Note (Signed)
The patient's symptoms are mostly related to nonspecific dyspepsia. There is no evidence for gastric outlet obstruction by prior endoscopy.  Recommendations #1 gastric emptying scan

## 2011-06-21 ENCOUNTER — Telehealth: Payer: Self-pay

## 2011-06-21 ENCOUNTER — Encounter (HOSPITAL_COMMUNITY)
Admission: RE | Admit: 2011-06-21 | Discharge: 2011-06-21 | Disposition: A | Discharge status: Home / Self Care | Payer: Medicare Other | Source: Ambulatory Visit | Attending: Gastroenterology | Admitting: Gastroenterology

## 2011-06-21 ENCOUNTER — Encounter (HOSPITAL_COMMUNITY): Payer: Self-pay

## 2011-06-21 DIAGNOSIS — R6881 Early satiety: Secondary | ICD-10-CM | POA: Insufficient documentation

## 2011-06-21 DIAGNOSIS — R141 Gas pain: Secondary | ICD-10-CM | POA: Insufficient documentation

## 2011-06-21 DIAGNOSIS — R109 Unspecified abdominal pain: Secondary | ICD-10-CM | POA: Insufficient documentation

## 2011-06-21 DIAGNOSIS — R634 Abnormal weight loss: Secondary | ICD-10-CM | POA: Insufficient documentation

## 2011-06-21 DIAGNOSIS — R142 Eructation: Secondary | ICD-10-CM | POA: Insufficient documentation

## 2011-06-21 MED ORDER — TECHNETIUM TC 99M SULFUR COLLOID
2.1000 | Freq: Once | INTRAVENOUS | Status: AC | PRN
  Administered 2011-06-21: 2.1 via INTRAVENOUS

## 2011-06-21 NOTE — Progress Notes (Signed)
Quick Note:  Please inform the patient that her gastric emptying scan was normal and that she shouldcontinue current plan of action ______

## 2011-06-21 NOTE — Telephone Encounter (Signed)
Message copied by Michele Mcalpine on Wed Jun 21, 2011  2:47 PM ------      Message from: Melvia Heaps MD D      Created: Wed Jun 21, 2011  2:25 PM       Please inform the patient that her gastric emptying scan was normal and that she shouldcontinue current plan of action

## 2011-06-21 NOTE — Telephone Encounter (Signed)
Pt aware.

## 2011-08-08 ENCOUNTER — Other Ambulatory Visit: Payer: Self-pay

## 2011-08-08 MED ORDER — SIMVASTATIN 20 MG PO TABS: 20.0000 mg | ORAL_TABLET | Freq: Every day | ORAL | Status: DC

## 2011-08-29 ENCOUNTER — Other Ambulatory Visit: Payer: Self-pay | Admitting: Internal Medicine

## 2011-08-29 ENCOUNTER — Ambulatory Visit: Payer: Medicare Other | Admitting: Internal Medicine

## 2011-08-29 DIAGNOSIS — Z0289 Encounter for other administrative examinations: Secondary | ICD-10-CM

## 2011-09-29 ENCOUNTER — Other Ambulatory Visit: Payer: Self-pay | Admitting: Internal Medicine

## 2011-10-26 ENCOUNTER — Ambulatory Visit (INDEPENDENT_AMBULATORY_CARE_PROVIDER_SITE_OTHER): Payer: Medicare Other | Admitting: Internal Medicine

## 2011-10-26 ENCOUNTER — Encounter: Payer: Self-pay | Admitting: Internal Medicine

## 2011-10-26 VITALS — BP 158/90 | HR 69 | Temp 98.3°F | Ht 64.5 in | Wt 172.1 lb

## 2011-10-26 DIAGNOSIS — F3289 Other specified depressive episodes: Secondary | ICD-10-CM

## 2011-10-26 DIAGNOSIS — R002 Palpitations: Secondary | ICD-10-CM | POA: Insufficient documentation

## 2011-10-26 DIAGNOSIS — F411 Generalized anxiety disorder: Secondary | ICD-10-CM

## 2011-10-26 DIAGNOSIS — M549 Dorsalgia, unspecified: Secondary | ICD-10-CM

## 2011-10-26 DIAGNOSIS — F329 Major depressive disorder, single episode, unspecified: Secondary | ICD-10-CM

## 2011-10-26 DIAGNOSIS — Z0000 Encounter for general adult medical examination without abnormal findings: Secondary | ICD-10-CM

## 2011-10-26 MED ORDER — CITALOPRAM HYDROBROMIDE 10 MG PO TABS: 10.0000 mg | ORAL_TABLET | Freq: Every day | ORAL | Status: DC

## 2011-10-26 MED ORDER — ALPRAZOLAM 0.25 MG PO TABS: 0.2500 mg | ORAL_TABLET | Freq: Two times a day (BID) | ORAL | Status: AC | PRN

## 2011-10-26 NOTE — Patient Instructions (Addendum)
Take all new medications as prescribed Continue all other medications as before  

## 2011-10-26 NOTE — Assessment & Plan Note (Addendum)
ECG reviewed as per emr, exam benign, most recent labs reviewed, ok to follow for now, consider holter monitor if persists

## 2011-10-26 NOTE — Progress Notes (Signed)
Subjective:    Patient ID: Catherine Gardner, female    DOB: November 05, 1961, 50 y.o.   MRN: [id number]  HPI  Here to f/u with acute;  Has had several episodes of chest fluttering/palpitations not characterized by skipped beat or missed beat but by hard beating only, last seconds at at time only and  Pt denies chest pain, increased sob or doe, wheezing, orthopnea, PND, increased LE swelling, dizziness or syncope.   Pt denies new neurological symptoms such as new headache, or facial or extremity weakness or numbness   Pt denies polydipsia, polyuria. No pain, fever, overt bleeding or bruising.  Has had mild increased anxiety and depressive symptoms but Denies suicidal ideation, or panic.  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  Past Medical History  Diagnosis Date  . SHINGLES 11/09/2009  . VITAMIN D DEFICIENCY 07/28/2009  . HYPERLIPIDEMIA 10/31/2007  . 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 DEPRESSION 10/31/2007  . HYPERTENSION 10/31/2007  . SINUSITIS- ACUTE-NOS 12/20/2009  . ARTHRITIS 10/31/2007  . POLYARTHRALGIA 10/31/2007  . LOW BACK PAIN 10/31/2007  . FIBROMYALGIA 10/31/2007  . OSTEOPENIA 10/31/2007  . INSOMNIA 06/02/2010  . RASH-NONVESICULAR 06/02/2010  . Headache 07/12/2009  . ANXIETY 12/27/2010   Past Surgical History  Procedure Date  . Abdominal hysterectomy   . S/p diskectomy 2004    L5  . Lumbar fusion 2008  . S/p right foot morton's neuroma Jan. 2011    reports that she has never smoked. She does not have any smokeless tobacco history on file. She reports that she drinks alcohol. She reports that she does not use illicit drugs. family history includes ADD / ADHD in her daughter; Alcohol abuse in her other; Crohn's disease in her other; Depression in her daughter; GER disease in her mother;  Hyperlipidemia in her other; Leukemia in her father; Ovarian cancer in her maternal aunt; and Throat cancer in her paternal uncle. Allergies  Allergen Reactions  . Etodolac     REACTION: Chest pain  . Imitrex (Sumatriptan Base)   . Meperidine Hcl     REACTION: Hives  . Morphine     REACTION: Hives  . Penicillins     REACTION: Hives   Current Outpatient Prescriptions on File Prior to Visit  Medication Sig Dispense Refill  . aspirin 81 MG tablet Take 81 mg by mouth daily.        Marland Kitchen b complex vitamins tablet Take by mouth. 1/2 tablet daily       . benazepril (LOTENSIN) 20 MG tablet TAKE ONE TABLET BY MOUTH EVERY DAY  90 tablet  1  . calcium-vitamin D (OSCAL WITH D) 500-200 MG-UNIT per tablet Take 1 tablet by mouth daily.        . Cinnamon 500 MG capsule Take 1,000 mg by mouth daily.        . lansoprazole (PREVACID) 30 MG capsule Take 1 capsule (30 mg total) by mouth daily.  30 capsule  11  . promethazine (PHENERGAN) 12.5 MG tablet Take 12.5 mg by mouth as needed.        . simvastatin (ZOCOR) 20 MG tablet Take 1 tablet (20 mg total) by mouth at bedtime. 1/2 po once daily  30 tablet  1  . traMADol (ULTRAM) 50 MG tablet       .  zolpidem (AMBIEN) 10 MG tablet TAKE 1 TABLET BY MOUTH AT BEDTIME AS NEEDED  30 tablet  5   Review of Systems Review of Systems  Constitutional: Negative for diaphoresis and unexpected weight change.  HENT: Negative for drooling and tinnitus.   Eyes: Negative for photophobia and visual disturbance.  Respiratory: Negative for choking and stridor.   Gastrointestinal: Negative for vomiting and blood in stool.  Genitourinary: Negative for hematuria and decreased urine volume.  Musculoskeletal: Negative for gait problem.  Skin: Negative for color change and wound.  Neurological: Negative for tremors and numbness.     Objective:   Physical Exam BP 158/90  Pulse 69  Temp(Src) 98.3 F (36.8 C) (Oral)  Ht 5' 4.5" (1.638 m)  Wt 172 lb 2 oz (78.075 kg)  BMI 29.09  kg/m2  SpO2 98% Physical Exam  VS noted, uncomfortable but not ill appearing Constitutional: Pt appears well-developed and well-nourished.  HENT: Head: Normocephalic.  Right Ear: External ear normal.  Left Ear: External ear normal.  Eyes: Conjunctivae and EOM are normal. Pupils are equal, round, and reactive to light.  Neck: Normal range of motion. Neck supple.  Cardiovascular: Normal rate and regular rhythm.   Pulmonary/Chest: Effort normal and breath sounds normal.  Abd:  Soft, NT, non-distended, + BS Neurological: Pt is alert. No cranial nerve deficit. motor/sens/dtr intact, gait normal Skin: Skin is warm. No erythema.  Psychiatric: Pt behavior is normal. Thought content normal. 1+ nervous, depressed affect Mild tender to bilat paravertebral areas to bilat upper thoracic, and right cervical/occiput without mass, swelling, rash    Assessment & Plan:

## 2011-10-29 ENCOUNTER — Encounter: Payer: Self-pay | Admitting: Internal Medicine

## 2011-10-29 NOTE — Assessment & Plan Note (Addendum)
C/w msk pain o/w benign neurological, c/w myofacial in the setting of FMS; ok for tramadol prn,  to f/u any worsening symptoms or concerns

## 2011-10-29 NOTE — Assessment & Plan Note (Signed)
Mild to mod, for celexa asd, to f/u any worsening symptoms or concerns 

## 2011-10-29 NOTE — Assessment & Plan Note (Signed)
Also ok for xanax prn, suspect this will help palp's as well;   to f/u any worsening symptoms or concerns

## 2011-12-13 ENCOUNTER — Other Ambulatory Visit: Payer: Self-pay | Admitting: Internal Medicine

## 2011-12-14 NOTE — Telephone Encounter (Signed)
Done hardcopy to robin  

## 2011-12-14 NOTE — Telephone Encounter (Signed)
Faxed hardcopy to pharmacy. 

## 2012-01-25 ENCOUNTER — Ambulatory Visit: Payer: Medicare Other | Admitting: Internal Medicine

## 2012-01-29 ENCOUNTER — Ambulatory Visit (INDEPENDENT_AMBULATORY_CARE_PROVIDER_SITE_OTHER): Payer: Medicare Other | Admitting: Internal Medicine

## 2012-01-29 ENCOUNTER — Other Ambulatory Visit (INDEPENDENT_AMBULATORY_CARE_PROVIDER_SITE_OTHER): Payer: Medicare Other

## 2012-01-29 ENCOUNTER — Encounter: Payer: Self-pay | Admitting: Internal Medicine

## 2012-01-29 VITALS — BP 130/80 | HR 67 | Temp 98.4°F | Ht 64.5 in | Wt 178.1 lb

## 2012-01-29 DIAGNOSIS — E785 Hyperlipidemia, unspecified: Secondary | ICD-10-CM

## 2012-01-29 DIAGNOSIS — M549 Dorsalgia, unspecified: Secondary | ICD-10-CM

## 2012-01-29 DIAGNOSIS — Z0000 Encounter for general adult medical examination without abnormal findings: Secondary | ICD-10-CM

## 2012-01-29 DIAGNOSIS — R002 Palpitations: Secondary | ICD-10-CM

## 2012-01-29 LAB — CBC WITH DIFFERENTIAL/PLATELET
Basophils Absolute: 0.1 10*3/uL (ref 0.0–0.1)
HCT: 41.1 % (ref 36.0–46.0)
Hemoglobin: 13.8 g/dL (ref 12.0–15.0)
Lymphs Abs: 3.5 10*3/uL (ref 0.7–4.0)
MCV: 92 fl (ref 78.0–100.0)
Monocytes Absolute: 0.4 10*3/uL (ref 0.1–1.0)
Neutro Abs: 2.7 10*3/uL (ref 1.4–7.7)
Platelets: 201 10*3/uL (ref 150.0–400.0)
RDW: 12.9 % (ref 11.5–14.6)

## 2012-01-29 LAB — URINALYSIS, ROUTINE W REFLEX MICROSCOPIC
Hgb urine dipstick: NEGATIVE
Leukocytes, UA: NEGATIVE
Nitrite: NEGATIVE
Total Protein, Urine: NEGATIVE
Urobilinogen, UA: 0.2 (ref 0.0–1.0)

## 2012-01-29 LAB — BASIC METABOLIC PANEL
CO2: 30 mEq/L (ref 19–32)
Calcium: 9.3 mg/dL (ref 8.4–10.5)
GFR: 88.72 mL/min (ref >60.00)
Glucose, Bld: 90 mg/dL (ref 70–99)
Potassium: 3.6 mEq/L (ref 3.5–5.1)
Sodium: 140 mEq/L (ref 135–145)

## 2012-01-29 LAB — HEPATIC FUNCTION PANEL
ALT: 22 U/L (ref 0–35)
Albumin: 4.1 g/dL (ref 3.5–5.2)
Total Bilirubin: 0.4 mg/dL (ref 0.3–1.2)

## 2012-01-29 LAB — LIPID PANEL
HDL: 90.6 mg/dL (ref >39.00)
Triglycerides: 108 mg/dL (ref 0.0–149.0)
VLDL: 21.6 mg/dL (ref 0.0–40.0)

## 2012-01-29 MED ORDER — BENAZEPRIL HCL 20 MG PO TABS: 20.0000 mg | ORAL_TABLET | Freq: Every day | ORAL | Status: DC

## 2012-01-29 NOTE — Assessment & Plan Note (Signed)
Resolved per pt.

## 2012-01-29 NOTE — Assessment & Plan Note (Signed)

## 2012-01-29 NOTE — Assessment & Plan Note (Signed)
Recently found out at [facility] that prior lumbar fusion did not work, to f/u soon - ? Need further surgury

## 2012-01-29 NOTE — Patient Instructions (Addendum)
Continue all other medications as before Your benazepril was refilled to the pharmacy Please have the pharmacy call if you need further refills Please go to LAB in the Basement for the blood and/or urine tests to be done today Please call the phone number [phone] (the PhoneTree System) for results of testing in 2-3 days;  When calling, simply dial the number, and when prompted enter the MRN number above (the Medical Record Number) and the # key, then the message should start. You are otherwise up to date with prevention Good Luck with your back situation; Please keep your appointments with your specialists as you have planned Your DMV handicap form was filled out today Please return in 6 months, or sooner if needed

## 2012-01-29 NOTE — Progress Notes (Signed)
Subjective:    Patient ID: Catherine Gardner, female    DOB: 1961/11/15, 51 y.o.   MRN: [id number]  HPI  Here for wellness and f/u;  Overall doing ok;  Pt denies CP, worsening SOB, DOE, wheezing, orthopnea, PND, worsening LE edema, palpitations, dizziness or syncope.  Pt denies neurological change such as new Headache, facial or extremity weakness.  Pt denies polydipsia, polyuria, or low sugar symptoms. Pt states overall good compliance with treatment and medications, good tolerability, and trying to follow lower cholesterol diet.  Pt denies worsening depressive symptoms, suicidal ideation or panic. No fever, wt loss, night sweats, loss of appetite, or other constitutional symptoms.  Pt states good ability with ADL's, low fall risk, home safety reviewed and adequate, no significant changes in hearing or vision, and occasionally active with exercise.  Did find out recently her prior lumbar fusion needs further surgury with an anterior low abd approch, and she is to f/u soon for further discussion Past Medical History  Diagnosis Date  . SHINGLES 11/09/2009  . VITAMIN D DEFICIENCY 07/28/2009  . HYPERLIPIDEMIA 10/31/2007  . 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 DEPRESSION 10/31/2007  . HYPERTENSION 10/31/2007  . SINUSITIS- ACUTE-NOS 12/20/2009  . ARTHRITIS 10/31/2007  . POLYARTHRALGIA 10/31/2007  . LOW BACK PAIN 10/31/2007  . FIBROMYALGIA 10/31/2007  . OSTEOPENIA 10/31/2007  . INSOMNIA 06/02/2010  . RASH-NONVESICULAR 06/02/2010  . Headache 07/12/2009  . ANXIETY 12/27/2010   Past Surgical History  Procedure Date  . Abdominal hysterectomy   . S/p diskectomy 2004    L5  . Lumbar fusion 2008  . S/p right foot morton's neuroma Jan. 2011    reports that she has never smoked. She does not have any smokeless tobacco history on file. She reports that she drinks alcohol. She reports that she does not use illicit drugs. family history includes ADD / ADHD in her daughter; Alcohol abuse in her other; Crohn's disease in  her other; Depression in her daughter; GER disease in her mother; Hyperlipidemia in her other; Leukemia in her father; Ovarian cancer in her maternal aunt; and Throat cancer in her paternal uncle. Allergies  Allergen Reactions  . Etodolac     REACTION: Chest pain  . Imitrex (Sumatriptan Base)   . Meperidine Hcl     REACTION: Hives  . Morphine     REACTION: Hives  . Penicillins     REACTION: Hives   Current Outpatient Prescriptions on File Prior to Visit  Medication Sig Dispense Refill  . ALPRAZolam (XANAX) 0.25 MG tablet Take 1 tablet (0.25 mg total) by mouth 2 (two) times daily as needed for anxiety.  60 tablet  2  . aspirin 81 MG tablet Take 81 mg by mouth daily.        Marland Kitchen b complex vitamins tablet Take by mouth. 1/2 tablet daily       . benazepril (LOTENSIN) 20 MG tablet TAKE ONE TABLET BY MOUTH EVERY DAY  90 tablet  1  . calcium-vitamin D (OSCAL WITH D) 500-200 MG-UNIT per tablet Take 1 tablet by mouth daily.        . Cinnamon 500 MG capsule Take 1,000 mg by mouth daily.        . citalopram (CELEXA) 10 MG tablet Take 1 tablet (10 mg total) by mouth daily.  30 tablet  11  . lansoprazole (PREVACID) 30 MG capsule Take 1 capsule (30 mg total) by mouth daily.  30 capsule  11  . promethazine (PHENERGAN) 12.5 MG tablet Take 12.5 mg  by mouth as needed.        . simvastatin (ZOCOR) 20 MG tablet Take 1 tablet (20 mg total) by mouth at bedtime. 1/2 po once daily  30 tablet  1  . traMADol (ULTRAM) 50 MG tablet       . zolpidem (AMBIEN) 10 MG tablet TAKE ONE TABLET BY MOUTH AT BEDTIME AS NEEDED  30 tablet  5   Review of Systems Review of Systems  Constitutional: Negative for diaphoresis, activity change, appetite change and unexpected weight change.  HENT: Negative for hearing loss, ear pain, facial swelling, mouth sores and neck stiffness.   Eyes: Negative for pain, redness and visual disturbance.  Respiratory: Negative for shortness of breath and wheezing.   Cardiovascular: Negative for  chest pain and palpitations.  Gastrointestinal: Negative for diarrhea, blood in stool, abdominal distention and rectal pain.  Genitourinary: Negative for hematuria, flank pain and decreased urine volume.  Musculoskeletal: Negative for myalgias and joint swelling.  Skin: Negative for color change and wound.  Neurological: Negative for syncope and numbness.  Hematological: Negative for adenopathy.  Psychiatric/Behavioral: Negative for hallucinations, self-injury, decreased concentration and agitation.      Objective:   Physical Exam BP 130/80  Pulse 67  Temp(Src) 98.4 F (36.9 C) (Oral)  Ht 5' 4.5" (1.638 m)  Wt 178 lb 2 oz (80.797 kg)  BMI 30.10 kg/m2  SpO2 97% Physical Exam  VS noted Constitutional: Pt appears well-developed and well-nourished.  HENT: Head: Normocephalic.  Right Ear: External ear normal.  Left Ear: External ear normal.  Eyes: Conjunctivae and EOM are normal. Pupils are equal, round, and reactive to light.  Neck: Normal range of motion. Neck supple.  Cardiovascular: Normal rate and regular rhythm.   Pulmonary/Chest: Effort normal and breath sounds normal.  Abd:  Soft, NT, non-distended, + BS Neurological: Pt is alert. No cranial nerve deficit.  Skin: Skin is warm. No erythema.  Psychiatric: Pt behavior is normal. Thought content normal. 1+ nervous    Assessment & Plan:

## 2012-04-29 ENCOUNTER — Other Ambulatory Visit: Payer: Self-pay

## 2012-04-29 MED ORDER — PROMETHAZINE HCL 12.5 MG PO TABS: 12.5000 mg | ORAL_TABLET | ORAL | Status: DC | PRN

## 2012-05-06 ENCOUNTER — Encounter: Payer: Self-pay | Admitting: Obstetrics and Gynecology

## 2012-05-12 ENCOUNTER — Other Ambulatory Visit: Payer: Self-pay | Admitting: Internal Medicine

## 2012-06-05 ENCOUNTER — Other Ambulatory Visit: Payer: Self-pay | Admitting: Internal Medicine

## 2012-07-08 ENCOUNTER — Other Ambulatory Visit: Payer: Self-pay | Admitting: Internal Medicine

## 2012-07-08 MED ORDER — PROMETHAZINE HCL 12.5 MG PO TABS: 12.5000 mg | ORAL_TABLET | ORAL | Status: DC | PRN

## 2012-07-08 NOTE — Telephone Encounter (Signed)
Faxed hardcopy to pharmacy. 

## 2012-07-08 NOTE — Telephone Encounter (Signed)
Done hardcopy to robin  

## 2012-07-31 ENCOUNTER — Ambulatory Visit: Payer: Medicare Other | Admitting: Internal Medicine

## 2012-07-31 DIAGNOSIS — Z0289 Encounter for other administrative examinations: Secondary | ICD-10-CM

## 2012-08-16 ENCOUNTER — Telehealth: Payer: Self-pay | Admitting: Internal Medicine

## 2012-08-16 NOTE — Telephone Encounter (Signed)
Not sure if this is allowed medicolegally- ok with me for pt to use the Treatment room, but this would have to be approved by administration as well  Will forward as well to [employer]

## 2012-08-16 NOTE — Telephone Encounter (Signed)
Pt is requesting you dr Jonny Ruiz to ok for dr Apolinar Junes wall/[employer] specialist to use a room in this office to adjust her neuro modulation device, that was placed in her 8/29----thank you for your reply

## 2012-08-20 NOTE — Telephone Encounter (Signed)
Pt states that she already made arrangements to go back to MD that implanted device for adjustment but requested to use our procedure room because she did not have transport to office in [facility] and technician was willing to come here for adjustments. Pt advised that practice does not cover outside MD or unlicensed individuals but referral can be placed to local specialist if needed in the future, Pt agreed and understood.

## 2012-08-22 ENCOUNTER — Other Ambulatory Visit: Payer: Self-pay | Admitting: Internal Medicine

## 2012-08-22 DIAGNOSIS — Z1231 Encounter for screening mammogram for malignant neoplasm of breast: Secondary | ICD-10-CM

## 2012-09-03 ENCOUNTER — Other Ambulatory Visit: Payer: Self-pay | Admitting: Internal Medicine

## 2012-09-09 ENCOUNTER — Ambulatory Visit: Payer: Medicare Other

## 2012-09-11 ENCOUNTER — Ambulatory Visit: Payer: Medicare Other

## 2012-09-16 ENCOUNTER — Other Ambulatory Visit (INDEPENDENT_AMBULATORY_CARE_PROVIDER_SITE_OTHER): Payer: Medicare Other

## 2012-09-16 ENCOUNTER — Encounter: Payer: Self-pay | Admitting: Internal Medicine

## 2012-09-16 ENCOUNTER — Ambulatory Visit (INDEPENDENT_AMBULATORY_CARE_PROVIDER_SITE_OTHER): Payer: Medicare Other | Admitting: Internal Medicine

## 2012-09-16 VITALS — BP 134/94 | HR 79 | Temp 98.0°F | Ht 64.5 in | Wt 188.4 lb

## 2012-09-16 DIAGNOSIS — M2550 Pain in unspecified joint: Secondary | ICD-10-CM

## 2012-09-16 DIAGNOSIS — F329 Major depressive disorder, single episode, unspecified: Secondary | ICD-10-CM

## 2012-09-16 DIAGNOSIS — R109 Unspecified abdominal pain: Secondary | ICD-10-CM

## 2012-09-16 DIAGNOSIS — M545 Low back pain, unspecified: Secondary | ICD-10-CM

## 2012-09-16 DIAGNOSIS — I10 Essential (primary) hypertension: Secondary | ICD-10-CM

## 2012-09-16 LAB — CBC WITH DIFFERENTIAL/PLATELET
Basophils Absolute: 0 10*3/uL (ref 0.0–0.1)
Eosinophils Relative: 1 % (ref 0.0–5.0)
Hemoglobin: 13.6 g/dL (ref 12.0–15.0)
Lymphocytes Relative: 44.2 % (ref 12.0–46.0)
Monocytes Relative: 5.2 % (ref 3.0–12.0)
Neutro Abs: 2.7 10*3/uL (ref 1.4–7.7)
Platelets: 224 10*3/uL (ref 150.0–400.0)
RDW: 13.3 % (ref 11.5–14.6)
WBC: 5.6 10*3/uL (ref 4.5–10.5)

## 2012-09-16 LAB — URINALYSIS, ROUTINE W REFLEX MICROSCOPIC
Hgb urine dipstick: NEGATIVE
Ketones, ur: NEGATIVE
Total Protein, Urine: NEGATIVE
Urine Glucose: NEGATIVE
Urobilinogen, UA: 0.2 (ref 0.0–1.0)

## 2012-09-16 LAB — BASIC METABOLIC PANEL
Calcium: 9.2 mg/dL (ref 8.4–10.5)
GFR: 106.94 mL/min (ref >60.00)
Glucose, Bld: 89 mg/dL (ref 70–99)
Potassium: 4.2 mEq/L (ref 3.5–5.1)
Sodium: 142 mEq/L (ref 135–145)

## 2012-09-16 LAB — HEPATIC FUNCTION PANEL
AST: 23 U/L (ref 0–37)
Albumin: 3.8 g/dL (ref 3.5–5.2)
Alkaline Phosphatase: 61 U/L (ref 39–117)
Bilirubin, Direct: 0.1 mg/dL (ref 0.0–0.3)
Total Bilirubin: 0.3 mg/dL (ref 0.3–1.2)

## 2012-09-16 NOTE — Assessment & Plan Note (Signed)
stable overall by hx and exam, most recent data reviewed with pt, and pt to continue medical treatment as before, declines need for change in tx, counseling or pschyiatric referral, verified nonsuicidal Lab Results  Component Value Date   WBC 6.7 01/29/2012   HGB 13.8 01/29/2012   HCT 41.1 01/29/2012   PLT 201.0 01/29/2012   GLUCOSE 90 01/29/2012   CHOL 212* 01/29/2012   TRIG 108.0 01/29/2012   HDL 90.60 01/29/2012   LDLDIRECT 101.8 01/29/2012   LDLCALC 105* 12/27/2010   ALT 22 01/29/2012   AST 22 01/29/2012   NA 140 01/29/2012   K 3.6 01/29/2012   CL 103 01/29/2012   CREATININE 0.9 01/29/2012   BUN 13 01/29/2012   CO2 30 01/29/2012   TSH 1.26 01/29/2012

## 2012-09-16 NOTE — Assessment & Plan Note (Addendum)
On nucynta for chronic LBP, exam benign, suspect increased recent  Constipation , for miralax daily, check UA,  to f/u any worsening symptoms or concerns

## 2012-09-16 NOTE — Progress Notes (Signed)
Subjective:    Patient ID: Catherine Gardner, female    DOB: 1961/01/16, 51 y.o.   MRN: [id number]  HPI  Here with 3 wks of increased pain to multiple areas; worse seems to be her chronic LBP where it has been determined she has a stimulator lead not place optimally, and will need to undergo surgical intervention oct 30, to avoid nsiads or asa until then;  Pain has been ongoing at least 7/10, worse to ambulate up to 9-10/10, currently also on nucynta, has some pain to both thighs and bilat lower back as well - not clear if all spine related.  Also with multiple joint pains primarily the MCP's bilat hands, knees, somewhat shoulders, has hx of + ANA from 2008, saw rheum then but no speciific dx per pt.  Has seem some swelling, particulary the hands yesterday, has tenderness to MCP's and swelling better today;   Pt denies fever, wt loss, night sweats, loss of appetite, or other constitutional symptoms  Pt also denies bowel or bladder change except for some constipation with discomfort worse to eating on the nucynta, but no fever, wt loss,  worsening LE pain/numbness/weakness, gait change or falls, though she has felt at times off balance.  Walks with cane today.  Nephew with JRA, but o/w no FH of signficant inflammatory arthritis such as Lupus.  Denies worsening depressive symptoms, suicidal ideation, or panic, though has been frustrated with decreased overall mobility recently related to her back and hands. Past Medical History  Diagnosis Date  . SHINGLES 11/09/2009  . VITAMIN D DEFICIENCY 07/28/2009  . HYPERLIPIDEMIA 10/31/2007  . 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 DEPRESSION 10/31/2007  . HYPERTENSION 10/31/2007  . SINUSITIS- ACUTE-NOS 12/20/2009  . ARTHRITIS 10/31/2007  . POLYARTHRALGIA 10/31/2007  . LOW BACK PAIN 10/31/2007  . FIBROMYALGIA 10/31/2007  . OSTEOPENIA 10/31/2007  . INSOMNIA 06/02/2010  . RASH-NONVESICULAR 06/02/2010  . Headache 07/12/2009  . ANXIETY 12/27/2010   Past Surgical History    Procedure Date  . Abdominal hysterectomy   . S/p diskectomy 2004    L5  . Lumbar fusion 2008  . S/p right foot morton's neuroma Jan. 2011    reports that she has never smoked. She does not have any smokeless tobacco history on file. She reports that she drinks alcohol. She reports that she does not use illicit drugs. family history includes ADD / ADHD in her daughter; Alcohol abuse in her other; Crohn's disease in her other; Depression in her daughter; GER disease in her mother; Hyperlipidemia in her other; Leukemia in her father; Ovarian cancer in her maternal aunt; and Throat cancer in her paternal uncle. Allergies  Allergen Reactions  . Etodolac     REACTION: Chest pain  . Imitrex (Sumatriptan Base)   . Meperidine Hcl     REACTION: Hives  . Morphine     REACTION: Hives  . Penicillins     REACTION: Hives    Current Outpatient Prescriptions on File Prior to Visit  Medication Sig Dispense Refill  . ALPRAZolam (XANAX) 0.25 MG tablet Take 1 tablet (0.25 mg total) by mouth 2 (two) times daily as needed for anxiety.  60 tablet  2  . aspirin 81 MG tablet Take 81 mg by mouth daily.        Marland Kitchen b complex vitamins tablet Take by mouth. 1/2 tablet daily       . benazepril (LOTENSIN) 20 MG tablet Take 1 tablet (20 mg total) by mouth daily.  90 tablet  3  .  calcium-vitamin D (OSCAL WITH D) 500-200 MG-UNIT per tablet Take 1 tablet by mouth daily.        . Cinnamon 500 MG capsule Take 1,000 mg by mouth daily.        . citalopram (CELEXA) 10 MG tablet Take 1 tablet (10 mg total) by mouth daily.  30 tablet  11  . lansoprazole (PREVACID) 30 MG capsule TAKE 1 CAPSULE BY MOUTH EVERY DAY  30 capsule  11  . promethazine (PHENERGAN) 12.5 MG tablet TAKE 1 TABLET BY MOUTH DAILY AS NEEDED  30 tablet  0  . simvastatin (ZOCOR) 20 MG tablet TAKE 1/2 TABLET BY MOUTH AT BEDTIME  30 tablet  8  . zolpidem (AMBIEN) 10 MG tablet TAKE 1 TABLET BY MOUTH AT BEDTIME AS NEEDED  30 tablet  5   Review of Systems   Constitutional: Negative for diaphoresis and unexpected weight change.  HENT: Negative for tinnitus.   Eyes: Negative for photophobia and visual disturbance.  Respiratory: Negative for choking and stridor.   Gastrointestinal: Negative for vomiting and blood in stool.  Genitourinary: Negative for hematuria and decreased urine volume.  Skin: Negative for color change and wound.  Neurological: Negative for tremors and numbness.  Psychiatric/Behavioral: Negative for decreased concentration. The patient is not hyperactive.       Objective:   Physical Exam BP 134/94  Pulse 79  Temp 98 F (36.7 C) (Oral)  Ht 5' 4.5" (1.638 m)  Wt 188 lb 6 oz (85.446 kg)  BMI 31.84 kg/m2  SpO2 98% Physical Exam  VS noted Constitutional: Pt appears well-developed and well-nourished.  HENT: Head: Normocephalic.  Right Ear: External ear normal.  Left Ear: External ear normal.  Eyes: Conjunctivae and EOM are normal. Pupils are equal, round, and reactive to light.  Neck: Normal range of motion. Neck supple.  Cardiovascular: Normal rate and regular rhythm.   Pulmonary/Chest: Effort normal and breath sounds normal.  Abd:  Soft, NT, non-distended, + BS Neurological: Pt is alert. Not confused ; spine nontender except for diffuse marked tender lumbar and right paraspinal area without swelling, rash, erythema Joints:  No active synovitis today, though bilat MCPs mild tender bilat, symmetric Skin: Skin is warm. No erythema. No LE edema Psychiatric: Pt behavior is normal. Thought content normal. somewhat dysphoric today    Assessment & Plan:

## 2012-09-16 NOTE — Assessment & Plan Note (Signed)
Etiology unclear, not obvious for inflammatory arthritis, has hx of + ANA 2008 but little o/w to suggest Lupus, most recent data reviewed with pt, and pt to continue medical treatment as before, check rheum labs, refer rheumatology

## 2012-09-16 NOTE — Assessment & Plan Note (Signed)
Continue all other medications as before, to f/u as above for stimulator lead movement oct 30

## 2012-09-16 NOTE — Assessment & Plan Note (Signed)
stable overall by hx and exam, most recent data reviewed with pt, and pt to continue medical treatment as before BP Readings from Last 3 Encounters:  09/16/12 134/94  01/29/12 130/80  10/26/11 158/90

## 2012-09-16 NOTE — Patient Instructions (Addendum)
Please take Miralax daily while on the nucynta as some of your discomfort may be related to constipation Continue all other medications as before Please go to LAB in the Basement for the blood and/or urine tests to be done today You will be contacted by phone if any changes need to be made immediately.  Otherwise, you will receive a letter about your results with an explanation. Please remember to sign up for My Chart at your earliest convenience, as this will be important to you in the future with finding out test results. You will be contacted regarding the referral for: rheumatology

## 2012-09-17 LAB — RHEUMATOID FACTOR: Rhuematoid fact SerPl-aCnc: <10 IU/mL (ref <=14)

## 2012-09-17 LAB — ANTI-NUCLEAR AB-TITER (ANA TITER)

## 2012-09-17 LAB — ANA: Anti Nuclear Antibody(ANA): POSITIVE — AB

## 2012-10-01 ENCOUNTER — Other Ambulatory Visit: Payer: Self-pay

## 2012-10-01 MED ORDER — PROMETHAZINE HCL 12.5 MG PO TABS: 12.5000 mg | ORAL_TABLET | Freq: Every day | ORAL | Status: DC | PRN

## 2012-10-27 ENCOUNTER — Other Ambulatory Visit: Payer: Self-pay | Admitting: Internal Medicine

## 2012-12-17 ENCOUNTER — Ambulatory Visit
Admission: RE | Admit: 2012-12-17 | Discharge: 2012-12-17 | Disposition: A | Discharge status: Home / Self Care | Payer: Medicare Other | Source: Ambulatory Visit | Attending: Internal Medicine | Admitting: Internal Medicine

## 2012-12-17 DIAGNOSIS — Z1231 Encounter for screening mammogram for malignant neoplasm of breast: Secondary | ICD-10-CM

## 2013-01-08 ENCOUNTER — Other Ambulatory Visit: Payer: Self-pay

## 2013-01-08 MED ORDER — BENAZEPRIL HCL 20 MG PO TABS: 20.0000 mg | ORAL_TABLET | Freq: Every day | ORAL | Status: DC

## 2013-01-11 ENCOUNTER — Other Ambulatory Visit: Payer: Self-pay

## 2013-01-23 ENCOUNTER — Other Ambulatory Visit: Payer: Self-pay | Admitting: Internal Medicine

## 2013-01-23 NOTE — Telephone Encounter (Signed)
Faxed hardcopy to pharmacy. 

## 2013-01-23 NOTE — Telephone Encounter (Signed)
Done hardcopy to robin  

## 2013-02-06 ENCOUNTER — Telehealth: Payer: Self-pay

## 2013-02-06 MED ORDER — ALPRAZOLAM 0.25 MG PO TABS: 0.2500 mg | ORAL_TABLET | Freq: Two times a day (BID) | ORAL | Status: DC | PRN

## 2013-02-06 NOTE — Telephone Encounter (Signed)
Faxed hardcopy to pharmacy. 

## 2013-02-06 NOTE — Telephone Encounter (Signed)
Done hardcopy to robin  

## 2013-02-06 NOTE — Telephone Encounter (Signed)
Refill request for alprazolam 0.25 mg 1 po BID for Anxiety PRN, please advise

## 2013-04-23 ENCOUNTER — Ambulatory Visit (INDEPENDENT_AMBULATORY_CARE_PROVIDER_SITE_OTHER): Payer: Medicare Other | Admitting: Internal Medicine

## 2013-04-23 ENCOUNTER — Encounter: Payer: Self-pay | Admitting: Internal Medicine

## 2013-04-23 VITALS — BP 132/80 | HR 81 | Temp 97.6°F | Ht 64.5 in | Wt 185.2 lb

## 2013-04-23 DIAGNOSIS — M899 Disorder of bone, unspecified: Secondary | ICD-10-CM

## 2013-04-23 DIAGNOSIS — M949 Disorder of cartilage, unspecified: Secondary | ICD-10-CM

## 2013-04-23 DIAGNOSIS — IMO0001 Reserved for inherently not codable concepts without codable children: Secondary | ICD-10-CM

## 2013-04-23 DIAGNOSIS — Z0000 Encounter for general adult medical examination without abnormal findings: Secondary | ICD-10-CM

## 2013-04-23 MED ORDER — ZOLPIDEM TARTRATE 10 MG PO TABS: ORAL_TABLET | ORAL | Status: DC

## 2013-04-23 MED ORDER — DULOXETINE HCL 20 MG PO CPEP: 20.0000 mg | ORAL_CAPSULE | Freq: Every day | ORAL | Status: DC

## 2013-04-23 MED ORDER — PROMETHAZINE HCL 12.5 MG PO TABS: 12.5000 mg | ORAL_TABLET | Freq: Every day | ORAL | Status: DC | PRN

## 2013-04-23 NOTE — Assessment & Plan Note (Addendum)
Due for dxa f/u, has finished course of actonel

## 2013-04-23 NOTE — Patient Instructions (Signed)
OK to stop the celexa Please take all new medication as prescribed  - the cymbalta low dose (20 mg) Please continue all other medications as before, and refills have been done if requested - the phenergan and ambien Please have the pharmacy call with any other refills you may need. Please continue your efforts at being more active, low cholesterol diet, and weight control. You are otherwise up to date with prevention measures today. Please keep your appointments with your specialists as you have planned- pain clinic Please schedule the Bone Density test with the schedulers as you leave today  Thank you for enrolling in MyChart. Please follow the instructions below to securely access your online medical record. MyChart allows you to send messages to your doctor, view your test results, renew your prescriptions, schedule appointments, and more.\  Please return in 6 months, or sooner if needed

## 2013-04-23 NOTE — Assessment & Plan Note (Signed)
0k to change celexa to cymbalta low dose 15 mg (did not tolerate higher)

## 2013-04-23 NOTE — Progress Notes (Signed)
Subjective:    Patient ID: Catherine Gardner, female    DOB: 12/04/1960, 52 y.o.   MRN: [id number]  HPI  Here for wellness and f/u;  Overall doing ok;  Pt denies CP, worsening SOB, DOE, wheezing, orthopnea, PND, worsening LE edema, palpitations, dizziness or syncope.  Pt denies neurological change such as new headache, facial or extremity weakness.  Pt denies polydipsia, polyuria, or low sugar symptoms. Pt states overall good compliance with treatment and medications, good tolerability, and has been trying to follow lower cholesterol diet.  Pt denies worsening depressive symptoms, suicidal ideation or panic. No fever, night sweats, wt loss, loss of appetite, or other constitutional symptoms.  Pt states good ability with ADL's, has low fall risk, home safety reviewed and adequate, no other significant changes in hearing or vision, and only occasionally active with exercise.  Has bilat anserine bursa type swelling and tender today.  Was seen per rheum oct 2013 with polyarthralgias  - nondiagnostic.  Has hx of FMS, and cymbalta per a [facility] study did better for her than the current celexa 10. Has f/u with pain clinic next wk. Due for dxa Past Medical History  Diagnosis Date  . SHINGLES 11/09/2009  . VITAMIN D DEFICIENCY 07/28/2009  . HYPERLIPIDEMIA 10/31/2007  . 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 DEPRESSION 10/31/2007  . HYPERTENSION 10/31/2007  . SINUSITIS- ACUTE-NOS 12/20/2009  . ARTHRITIS 10/31/2007  . POLYARTHRALGIA 10/31/2007  . LOW BACK PAIN 10/31/2007  . FIBROMYALGIA 10/31/2007  . OSTEOPENIA 10/31/2007  . INSOMNIA 06/02/2010  . RASH-NONVESICULAR 06/02/2010  . Headache(784.0) 07/12/2009  . ANXIETY 12/27/2010   Past Surgical History  Procedure Laterality Date  . Abdominal hysterectomy    . S/p diskectomy  2004    L5  . Lumbar fusion  2008  . S/p right foot morton's neuroma  Jan. 2011    reports that she has never smoked. She does not have any smokeless tobacco history on file. She reports that  drinks  alcohol. She reports that she does not use illicit drugs. family history includes ADD / ADHD in her daughter; Alcohol abuse in her other; Crohn's disease in her other; Depression in her daughter; GER disease in her mother; Hyperlipidemia in her other; Leukemia in her father; Ovarian cancer in her maternal aunt; and Throat cancer in her paternal uncle. Allergies  Allergen Reactions  . Benazepril     Hair falls out  . Etodolac     REACTION: Chest pain  . Imitrex (Sumatriptan Base)   . Meperidine Hcl     REACTION: Hives  . Morphine     REACTION: Hives  . Nickel     rash  . Penicillins     REACTION: Hives  . Simvastatin     Join pain and itching   Current Outpatient Prescriptions on File Prior to Visit  Medication Sig Dispense Refill  . ALPRAZolam (XANAX) 0.25 MG tablet Take 1 tablet (0.25 mg total) by mouth 2 (two) times daily as needed.  60 tablet  1  . aspirin 81 MG tablet Take 81 mg by mouth daily.        Marland Kitchen b complex vitamins tablet Take by mouth. 1/2 tablet daily       . calcium-vitamin D (OSCAL WITH D) 500-200 MG-UNIT per tablet Take 1 tablet by mouth daily.        . Cinnamon 500 MG capsule Take 1,000 mg by mouth daily.        . citalopram (CELEXA) 10 MG tablet TAKE ONE  TABLET BY MOUTH EVERY DAY  30 tablet  11  . lansoprazole (PREVACID) 30 MG capsule TAKE 1 CAPSULE BY MOUTH EVERY DAY  30 capsule  11  . promethazine (PHENERGAN) 12.5 MG tablet Take 1 tablet (12.5 mg total) by mouth daily as needed for nausea.  30 tablet  0  . zolpidem (AMBIEN) 10 MG tablet TAKE 1 TABLET BY MOUTH EVERY NIGHT AT BEDTIME AS NEEDED  30 tablet  5   No current facility-administered medications on file prior to visit.   Review of Systems Constitutional: Negative for diaphoresis, activity change, appetite change or unexpected weight change.  HENT: Negative for hearing loss, ear pain, facial swelling, mouth sores and neck stiffness.   Eyes: Negative for pain, redness and visual disturbance.  Respiratory:  Negative for shortness of breath and wheezing.   Cardiovascular: Negative for chest pain and palpitations.  Gastrointestinal: Negative for diarrhea, blood in stool, abdominal distention or other pain Genitourinary: Negative for hematuria, flank pain or change in urine volume.  Musculoskeletal: Negative for myalgias and joint swelling.  Skin: Negative for color change and wound.  Neurological: Negative for syncope and numbness. other than noted Hematological: Negative for adenopathy.  Psychiatric/Behavioral: Negative for hallucinations, self-injury, decreased concentration and agitation.      Objective:   Physical Exam BP 132/80  Pulse 81  Temp(Src) 97.6 F (36.4 C) (Oral)  Ht 5' 4.5" (1.638 m)  Wt 185 lb 4 oz (84.029 kg)  BMI 31.32 kg/m2  SpO2 93% VS noted,  Constitutional: Pt is oriented to person, place, and time. Appears well-developed and well-nourished.  Head: Normocephalic and atraumatic.  Right Ear: External ear normal.  Left Ear: External ear normal.  Nose: Nose normal.  Mouth/Throat: Oropharynx is clear and moist.  Eyes: Conjunctivae and EOM are normal. Pupils are equal, round, and reactive to light.  Neck: Normal range of motion. Neck supple. No JVD present. No tracheal deviation present.  Cardiovascular: Normal rate, regular rhythm, normal heart sounds and intact distal pulses.   Pulmonary/Chest: Effort normal and breath sounds normal.  Abdominal: Soft. Bowel sounds are normal. There is no tenderness. No HSM  Musculoskeletal: Normal range of motion. Exhibits no edema. Has marked tender diffuse lumbar spine Lymphadenopathy:  Has no cervical adenopathy.  Neurological: Pt is alert and oriented to person, place, and time. Pt has normal reflexes. No cranial nerve deficit.  Skin: Skin is warm and dry. No rash noted.  Psychiatric:  Has  Mild nervous mood and affect. Behavior is normal.     Assessment & Plan:

## 2013-04-23 NOTE — Assessment & Plan Note (Signed)

## 2013-04-30 DIAGNOSIS — IMO0001 Reserved for inherently not codable concepts without codable children: Secondary | ICD-10-CM | POA: Insufficient documentation

## 2013-05-06 ENCOUNTER — Encounter: Payer: Self-pay | Admitting: Internal Medicine

## 2013-05-06 ENCOUNTER — Ambulatory Visit (INDEPENDENT_AMBULATORY_CARE_PROVIDER_SITE_OTHER): Payer: Medicare Other | Admitting: Internal Medicine

## 2013-05-06 ENCOUNTER — Other Ambulatory Visit (INDEPENDENT_AMBULATORY_CARE_PROVIDER_SITE_OTHER): Payer: Medicare Other

## 2013-05-06 ENCOUNTER — Other Ambulatory Visit: Payer: Self-pay | Admitting: Internal Medicine

## 2013-05-06 VITALS — BP 120/88 | HR 72 | Temp 97.1°F | Ht 64.0 in | Wt 185.0 lb

## 2013-05-06 DIAGNOSIS — R232 Flushing: Secondary | ICD-10-CM

## 2013-05-06 DIAGNOSIS — I10 Essential (primary) hypertension: Secondary | ICD-10-CM

## 2013-05-06 DIAGNOSIS — I83819 Varicose veins of unspecified lower extremities with pain: Secondary | ICD-10-CM | POA: Insufficient documentation

## 2013-05-06 DIAGNOSIS — F411 Generalized anxiety disorder: Secondary | ICD-10-CM

## 2013-05-06 DIAGNOSIS — I83893 Varicose veins of bilateral lower extremities with other complications: Secondary | ICD-10-CM

## 2013-05-06 DIAGNOSIS — N951 Menopausal and female climacteric states: Secondary | ICD-10-CM

## 2013-05-06 DIAGNOSIS — I83813 Varicose veins of bilateral lower extremities with pain: Secondary | ICD-10-CM

## 2013-05-06 LAB — FOLLICLE STIMULATING HORMONE: FSH: 65.1 m[IU]/mL

## 2013-05-06 MED ORDER — ESTRADIOL 0.025 MG/24HR TD PTTW: 1.0000 | MEDICATED_PATCH | TRANSDERMAL | Status: DC

## 2013-05-06 NOTE — Progress Notes (Signed)
Subjective:    Patient ID: Catherine Gardner, female    DOB: 1961-04-23, 52 y.o.   MRN: [id number]  HPI  Here to f/u, c/o hot flashes day and night for 2 yrs, sometime less and sometime more, random, finally to the point she wants to tx, nothing seems to make better or worse,  Is s/p TAH 2004, ovaries intact,  Pt denies fever, wt loss, night sweats, loss of appetite, or other constitutional symptoms  No known malignancy, allergy or other reason for recurrent flashes.  Wondering if she menopausal, has some occas mood swings, occas vaginal dryness. 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 Pt denies chest pain, increased sob or doe, wheezing, orthopnea, PND, increased LE swelling, palpitations, dizziness or syncope. Pt denies new neurological symptoms such as new headache, or facial or extremity weakness or numbness Denies worsening depressive symptoms, suicidal ideation, or panic Past Medical History  Diagnosis Date  . SHINGLES 11/09/2009  . VITAMIN D DEFICIENCY 07/28/2009  . HYPERLIPIDEMIA 10/31/2007  . 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 DEPRESSION 10/31/2007  . HYPERTENSION 10/31/2007  . SINUSITIS- ACUTE-NOS 12/20/2009  . ARTHRITIS 10/31/2007  . POLYARTHRALGIA 10/31/2007  . LOW BACK PAIN 10/31/2007  . FIBROMYALGIA 10/31/2007  . OSTEOPENIA 10/31/2007  . INSOMNIA 06/02/2010  . RASH-NONVESICULAR 06/02/2010  . Headache(784.0) 07/12/2009  . ANXIETY 12/27/2010   Past Surgical History  Procedure Laterality Date  . Abdominal hysterectomy    . S/p diskectomy  2004    L5  . Lumbar fusion  2008  . S/p right foot morton's neuroma  Jan. 2011    reports that she has never smoked. She does not have any smokeless tobacco history on file. She reports that  drinks alcohol. She reports that she does not use illicit drugs. family history includes ADD / ADHD in her daughter; Alcohol abuse in her other;  Crohn's disease in her other; Depression in her daughter; GER disease in her mother; Hyperlipidemia in her other; Leukemia in her father; Ovarian cancer in her maternal aunt; and Throat cancer in her paternal uncle. Allergies  Allergen Reactions  . Benazepril     Hair falls out  . Etodolac     REACTION: Chest pain  . Imitrex (Sumatriptan Base)   . Meperidine Hcl     REACTION: Hives  . Morphine     REACTION: Hives  . Nickel     rash  . Penicillins     REACTION: Hives  . Simvastatin     Join pain and itching   Current Outpatient Prescriptions on File Prior to Visit  Medication Sig Dispense Refill  . ALPRAZolam (XANAX) 0.25 MG tablet Take 1 tablet (0.25 mg total) by mouth 2 (two) times daily as needed.  60 tablet  1  . aspirin 81 MG tablet Take 81 mg by mouth daily.        Marland Kitchen b complex vitamins tablet Take by mouth. 1/2 tablet daily       . calcium-vitamin D (OSCAL WITH D) 500-200 MG-UNIT per tablet Take 1 tablet by mouth daily.        . Cinnamon 500 MG capsule Take 1,000 mg by mouth daily.        . DULoxetine (CYMBALTA) 20 MG capsule Take 1 capsule (20 mg total) by mouth daily.  90 capsule  3  . lansoprazole (PREVACID) 30 MG capsule TAKE 1  CAPSULE BY MOUTH EVERY DAY  30 capsule  11  . promethazine (PHENERGAN) 12.5 MG tablet Take 1 tablet (12.5 mg total) by mouth daily as needed for nausea.  30 tablet  03  . zolpidem (AMBIEN) 10 MG tablet TAKE 1 TABLET BY MOUTH EVERY NIGHT AT BEDTIME AS NEEDED  30 tablet  5   No current facility-administered medications on file prior to visit.   Review of Systems  Constitutional: Negative for unexpected weight change, or unusual diaphoresis  HENT: Negative for tinnitus.   Eyes: Negative for photophobia and visual disturbance.  Respiratory: Negative for choking and stridor.   Gastrointestinal: Negative for vomiting and blood in stool.  Genitourinary: Negative for hematuria and decreased urine volume.  Musculoskeletal: Negative for acute joint  swelling Skin: Negative for color change and wound.  Neurological: Negative for tremors and numbness other than noted  Psychiatric/Behavioral: Negative for decreased concentration or  hyperactivity.       Objective:   Physical Exam BP 120/88  Pulse 72  Temp(Src) 97.1 F (36.2 C) (Oral)  Ht 5\' 4"  (1.626 m)  Wt 185 lb (83.915 kg)  BMI 31.74 kg/m2  SpO2 97% VS noted, not ill appaering Constitutional: Pt appears well-developed and well-nourished.  HENT: Head: NCAT.  Right Ear: External ear normal.  Left Ear: External ear normal.  Eyes: Conjunctivae and EOM are normal. Pupils are equal, round, and reactive to light.  Neck: Normal range of motion. Neck supple.  Cardiovascular: Normal rate and regular rhythm.   Pulmonary/Chest: Effort normal and breath sounds normal.  Neurological: Pt is alert. Not confused  Skin: Skin is warm. No erythema. 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 Thought content normal.         Assessment & Plan:

## 2013-05-06 NOTE — Patient Instructions (Signed)
Please continue all other medications as before Please go to the LAB in the Basement (turn left off the elevator) for the tests to be done today  - just the [facility] today If elevated, we should consider treatment such as the estrogen patch You will be contacted regarding the referral for: Vein clinic  Please return in 6 months, or sooner if needed

## 2013-05-07 NOTE — Assessment & Plan Note (Signed)
stable overall by history and exam, recent data reviewed with pt, and pt to continue medical treatment as before,  to f/u any worsening symptoms or concerns Lab Results  Component Value Date   WBC 5.6 09/16/2012   HGB 13.6 09/16/2012   HCT 40.3 09/16/2012   PLT 224.0 09/16/2012   GLUCOSE 89 09/16/2012   CHOL 212* 01/29/2012   TRIG 108.0 01/29/2012   HDL 90.60 01/29/2012   LDLDIRECT 101.8 01/29/2012   LDLCALC 105* 12/27/2010   ALT 23 09/16/2012   AST 23 09/16/2012   NA 142 09/16/2012   K 4.2 09/16/2012   CL 105 09/16/2012   CREATININE 0.7 09/16/2012   BUN 10 09/16/2012   CO2 31 09/16/2012   TSH 1.26 01/29/2012

## 2013-05-07 NOTE — Assessment & Plan Note (Signed)
stable overall by history and exam, recent data reviewed with pt, and pt to continue medical treatment as before,  to f/u any worsening symptoms or concerns BP Readings from Last 3 Encounters:  05/06/13 120/88  04/23/13 132/80  09/16/12 134/94

## 2013-05-07 NOTE — Assessment & Plan Note (Signed)
For [facility] today, suspect menopausal, d/w pt - would be candidate for for vivelle, or effexor

## 2013-05-07 NOTE — Assessment & Plan Note (Signed)
No phebitis by exam, ok for referral to vein clinic, cont asa

## 2013-05-13 ENCOUNTER — Ambulatory Visit (INDEPENDENT_AMBULATORY_CARE_PROVIDER_SITE_OTHER)
Admission: RE | Admit: 2013-05-13 | Discharge: 2013-05-13 | Disposition: A | Discharge status: Home / Self Care | Payer: Medicare Other | Source: Ambulatory Visit | Attending: Internal Medicine | Admitting: Internal Medicine

## 2013-05-13 DIAGNOSIS — M949 Disorder of cartilage, unspecified: Secondary | ICD-10-CM

## 2013-05-13 DIAGNOSIS — M899 Disorder of bone, unspecified: Secondary | ICD-10-CM

## 2013-05-15 ENCOUNTER — Encounter: Payer: Self-pay | Admitting: Vascular Surgery

## 2013-05-15 ENCOUNTER — Ambulatory Visit (INDEPENDENT_AMBULATORY_CARE_PROVIDER_SITE_OTHER): Payer: Medicare Other | Admitting: Vascular Surgery

## 2013-05-15 VITALS — BP 188/94 | HR 78 | Resp 18 | Ht 64.5 in | Wt 185.4 lb

## 2013-05-15 DIAGNOSIS — I83893 Varicose veins of bilateral lower extremities with other complications: Secondary | ICD-10-CM | POA: Insufficient documentation

## 2013-05-15 NOTE — Progress Notes (Signed)
The patient has today for evaluation of discomfort in both lower extremities. She has a long history of pain in her legs. He has had a spinal issues at age 52. Her at area of concern is unknown both thighs and calves. She does have the typical pattern of spider veins telangiectasia. She does not have any evidence of varicosity or other areas of concern for venous hypertension. She has no history of DVT. She reports that she does have aching over these areas which can be worse with standing but can't be present at rest as well.  Past Medical History  Diagnosis Date  . SHINGLES 11/09/2009  . VITAMIN D DEFICIENCY 07/28/2009  . HYPERLIPIDEMIA 10/31/2007  . 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 DEPRESSION 10/31/2007  . HYPERTENSION 10/31/2007  . SINUSITIS- ACUTE-NOS 12/20/2009  . ARTHRITIS 10/31/2007  . POLYARTHRALGIA 10/31/2007  . LOW BACK PAIN 10/31/2007  . FIBROMYALGIA 10/31/2007  . OSTEOPENIA 10/31/2007  . INSOMNIA 06/02/2010  . RASH-NONVESICULAR 06/02/2010  . Headache(784.0) 07/12/2009  . ANXIETY 12/27/2010    History  Substance Use Topics  . Smoking status: Never Smoker   . Smokeless tobacco: Not on file  . Alcohol Use: Yes     Comment: very very rare    Family History  Problem Relation Age of Onset  . ADD / ADHD Daughter   . Depression Daughter   . Hyperlipidemia Other   . Alcohol abuse Other     alcholism/addiction  . Crohn's disease Other   . Leukemia Father   . GER disease Mother   . Ovarian cancer Maternal Aunt   . Throat cancer Paternal Uncle     Allergies  Allergen Reactions  . Benazepril     Hair falls out  . Etodolac     REACTION: Chest pain  . Imitrex (Sumatriptan Base)   . Meperidine Hcl     REACTION: Hives  . Morphine     REACTION: Hives  . Nickel     rash  . Penicillins     REACTION: Hives  . Simvastatin     Join pain and itching    Current outpatient prescriptions:ALPRAZolam (XANAX) 0.25 MG tablet, Take 1 tablet (0.25 mg total) by mouth 2 (two) times daily  as needed., Disp: 60 tablet, Rfl: 1;  aspirin 81 MG tablet, Take 81 mg by mouth daily.  , Disp: , Rfl: ;  b complex vitamins tablet, Take by mouth. 1/2 tablet daily , Disp: , Rfl: ;  Cinnamon 500 MG capsule, Take 1,000 mg by mouth daily.  , Disp: , Rfl:  DULoxetine (CYMBALTA) 20 MG capsule, Take 1 capsule (20 mg total) by mouth daily., Disp: 90 capsule, Rfl: 3;  estradiol (VIVELLE-DOT) 0.025 MG/24HR, Place 1 patch onto the skin 2 (two) times a week., Disp: 8 patch, Rfl: 12;  lansoprazole (PREVACID) 30 MG capsule, TAKE 1 CAPSULE BY MOUTH EVERY DAY, Disp: 30 capsule, Rfl: 11 promethazine (PHENERGAN) 12.5 MG tablet, Take 1 tablet (12.5 mg total) by mouth daily as needed for nausea., Disp: 30 tablet, Rfl: 03;  traMADol (ULTRAM) 50 MG tablet, Take 50 mg by mouth every 6 (six) hours as needed for pain., Disp: , Rfl: ;  zolpidem (AMBIEN) 10 MG tablet, TAKE 1 TABLET BY MOUTH EVERY NIGHT AT BEDTIME AS NEEDED, Disp: 30 tablet, Rfl: 5 calcium-vitamin D (OSCAL WITH D) 500-200 MG-UNIT per tablet, Take 1 tablet by mouth daily.  , Disp: , Rfl:   BP 188/94  Pulse 78  Resp 18  Ht 5' 4.5" (1.638  m)  Wt 185 lb 6.4 oz (84.097 kg)  BMI 31.34 kg/m2  Body mass index is 31.34 kg/(m^2).       Physical exam: Well-developed well-nourished female in no acute distress appearing stated age She is grossly intact neurologically Respirations are nonlabored Radial and dorsalis pedis pulses are 2+ bilaterally He does not have any evidence of venous varicosities in her lower extremities. She does have scattered telangiectasia the typical fanlike pattern on her lateral thighs and her posterior calf  I imaged her greater small saphenous vein with the SonoSite ultrasound. She did not have a formal venous duplex. Her saphenous veins are not dilated and appeared to have no evidence of reflux.  A long discussion with the patient regarding this. I explained that it would be in likely that the pain in her legs is associated with the  telangiectasia. I explained that there is no evidence that she has more serious issues. I explained is quite common that over 50% of women have spider veins telangiectasia. She said that her mother had quite extensive telangiectasia. I explained the treatment for this would be sclerotherapy but feel that this would have a very low chance of improving her symptoms. He is not concerned about the cosmetic concern of this. I have discussed compression with her. She does have some relief from knee-high and is requesting a fitting for thigh high compression which has been done today. She will notify should she develop difficulty in the future.

## 2013-05-22 ENCOUNTER — Encounter: Payer: Self-pay | Admitting: Internal Medicine

## 2013-05-23 ENCOUNTER — Encounter: Payer: Self-pay | Admitting: Internal Medicine

## 2013-05-23 ENCOUNTER — Telehealth: Payer: Self-pay

## 2013-05-23 MED ORDER — ESTRADIOL 0.05 MG/24HR TD PTTW: 1.0000 | MEDICATED_PATCH | TRANSDERMAL | Status: DC

## 2013-05-23 NOTE — Telephone Encounter (Signed)
Pharmacist informed of corrected rx.

## 2013-05-23 NOTE — Telephone Encounter (Signed)
rx corrected 

## 2013-05-23 NOTE — Telephone Encounter (Signed)
[facility] pharmacist from [facility] needs clarification on vivelle dot to change once a week or twice a week as ususally to change patch twice a week.  Please advise

## 2013-06-09 ENCOUNTER — Encounter: Payer: Self-pay | Admitting: Internal Medicine

## 2013-06-17 ENCOUNTER — Other Ambulatory Visit: Payer: Self-pay | Admitting: Internal Medicine

## 2013-07-03 ENCOUNTER — Telehealth: Payer: Self-pay | Admitting: Internal Medicine

## 2013-07-03 NOTE — Telephone Encounter (Signed)
Called the patient mailbox was full unable to lm

## 2013-07-03 NOTE — Telephone Encounter (Signed)
Called informed the patient of MD instructions.  She agreed to all and had already researched the chair and  Realized it was not for her .  Appointment was canceled.

## 2013-07-03 NOTE — Telephone Encounter (Signed)
I have received information about pt coming for Hoverround evaluation /Mobility exam for aug 11  I have reviewed chart, and I do not think she would qualify, as she can walk outside the home.  The Hoverround is meant only for those persons who cannot walk in the home.    She can keep her appt, but at this point unless her exam since last visit has changed, I would not certify her as needing Hoverround

## 2013-07-04 ENCOUNTER — Encounter: Payer: Medicare Other | Admitting: Vascular Surgery

## 2013-07-07 ENCOUNTER — Ambulatory Visit: Payer: Medicare Other | Admitting: Internal Medicine

## 2013-07-14 ENCOUNTER — Other Ambulatory Visit: Payer: Self-pay | Admitting: Internal Medicine

## 2013-07-14 NOTE — Telephone Encounter (Signed)
Faxed hardcopy to [facility]. 

## 2013-07-14 NOTE — Telephone Encounter (Signed)
Done hardcopy to robin  

## 2013-07-16 ENCOUNTER — Other Ambulatory Visit: Payer: Self-pay | Admitting: Internal Medicine

## 2013-07-17 NOTE — Telephone Encounter (Signed)
Done hardcopy to robin  

## 2013-07-17 NOTE — Telephone Encounter (Signed)
Faxed hardcopy to [facility] 

## 2013-07-23 ENCOUNTER — Encounter: Payer: Self-pay | Admitting: Internal Medicine

## 2013-10-02 ENCOUNTER — Other Ambulatory Visit: Payer: Self-pay

## 2013-10-08 ENCOUNTER — Other Ambulatory Visit: Payer: Self-pay | Admitting: Internal Medicine

## 2013-10-28 ENCOUNTER — Ambulatory Visit (INDEPENDENT_AMBULATORY_CARE_PROVIDER_SITE_OTHER): Payer: Medicare Other | Admitting: Internal Medicine

## 2013-10-28 ENCOUNTER — Encounter: Payer: Self-pay | Admitting: Internal Medicine

## 2013-10-28 VITALS — BP 118/82 | HR 90 | Temp 98.6°F | Ht 64.5 in | Wt 185.8 lb

## 2013-10-28 DIAGNOSIS — R768 Other specified abnormal immunological findings in serum: Secondary | ICD-10-CM | POA: Insufficient documentation

## 2013-10-28 DIAGNOSIS — N951 Menopausal and female climacteric states: Secondary | ICD-10-CM

## 2013-10-28 DIAGNOSIS — M545 Low back pain, unspecified: Secondary | ICD-10-CM

## 2013-10-28 DIAGNOSIS — G8929 Other chronic pain: Secondary | ICD-10-CM

## 2013-10-28 DIAGNOSIS — R894 Abnormal immunological findings in specimens from other organs, systems and tissues: Secondary | ICD-10-CM

## 2013-10-28 DIAGNOSIS — Z0000 Encounter for general adult medical examination without abnormal findings: Secondary | ICD-10-CM

## 2013-10-28 HISTORY — DX: Other chronic pain: G89.29

## 2013-10-28 HISTORY — DX: Low back pain, unspecified: M54.50

## 2013-10-28 MED ORDER — ZOLPIDEM TARTRATE 10 MG PO TABS: ORAL_TABLET | ORAL | Status: DC

## 2013-10-28 MED ORDER — ALPRAZOLAM 0.25 MG PO TABS: ORAL_TABLET | ORAL | Status: DC

## 2013-10-28 MED ORDER — TRAMADOL HCL 50 MG PO TABS: 50.0000 mg | ORAL_TABLET | Freq: Four times a day (QID) | ORAL | Status: DC | PRN

## 2013-10-28 NOTE — Assessment & Plan Note (Signed)
Overall doing well, age appropriate education and counseling updated, referrals for preventative services and immunizations addressed, dietary and smoking counseling addressed, most recent labs reviewed.  I have personally reviewed and have noted: 1) the patient's medical and social history 2) The pt's use of alcohol, tobacco, and illicit drugs 3) The patient's current medications and supplements 4) Functional ability including ADL's, fall risk, home safety risk, hearing and visual impairment 5) Diet and physical activities 6) Evidence for depression or mood disorder 7) The patient's height, weight, and BMI have been recorded in the chart I have made referrals, and provided counseling and education based on review of the above Declines flu shot, last DXA may 2014 with osteopenia now on vivelle

## 2013-10-28 NOTE — Patient Instructions (Signed)

## 2013-10-28 NOTE — Assessment & Plan Note (Signed)
Also for anti-ds dna,  to f/u any worsening symptoms or concerns

## 2013-10-28 NOTE — Assessment & Plan Note (Signed)
Last rx for trmaadol was aug 2013, ok for 30 days to bridge back to [facility] pain clinic

## 2013-10-28 NOTE — Progress Notes (Signed)
Subjective:    Patient ID: Catherine Gardner, female    DOB: 10-Jun-1961, 52 y.o.   MRN: [id number]  HPI  Here for wellness and f/u;  Overall doing ok;  Pt denies CP, worsening SOB, DOE, wheezing, orthopnea, PND, worsening LE edema, palpitations, dizziness or syncope.  Pt denies neurological change such as new headache, facial or extremity weakness.  Pt denies polydipsia, polyuria, or low sugar symptoms. Pt states overall good compliance with treatment and medications, good tolerability, and has been trying to follow lower cholesterol diet.  Pt denies worsening depressive symptoms, suicidal ideation or panic. No fever, night sweats, wt loss, loss of appetite, or other constitutional symptoms.  Pt states good ability with ADL's, has low fall risk, home safety reviewed and adequate, no other significant changes in hearing or vision, and only occasionally active with exercise, due to chronic LBP, seeing pain clinic with ongoing LS spine deg dz.  Had DXA and mammogram recently.  No new complaints.  Vivelle working well for hot flashes, uses the patch somewhat "off and on" Past Medical History  Diagnosis Date  . SHINGLES 11/09/2009  . VITAMIN D DEFICIENCY 07/28/2009  . HYPERLIPIDEMIA 10/31/2007  . 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 DEPRESSION 10/31/2007  . HYPERTENSION 10/31/2007  . SINUSITIS- ACUTE-NOS 12/20/2009  . ARTHRITIS 10/31/2007  . POLYARTHRALGIA 10/31/2007  . LOW BACK PAIN 10/31/2007  . FIBROMYALGIA 10/31/2007  . OSTEOPENIA 10/31/2007  . INSOMNIA 06/02/2010  . RASH-NONVESICULAR 06/02/2010  . Headache(784.0) 07/12/2009  . ANXIETY 12/27/2010  . Chronic lower back pain 10/28/2013   Past Surgical History  Procedure Laterality Date  . Abdominal hysterectomy    . S/p diskectomy  2004    L5  . Lumbar fusion  2008  . S/p right foot morton's neuroma  Jan. 2011    reports that she has never smoked. She does not have any smokeless tobacco history on file. She reports that she drinks alcohol. She reports  that she does not use illicit drugs. family history includes ADD / ADHD in her daughter; Alcohol abuse in her other; Crohn's disease in her other; Depression in her daughter; GER disease in her mother; Hyperlipidemia in her other; Leukemia in her father; Ovarian cancer in her maternal aunt; Throat cancer in her paternal uncle. Allergies  Allergen Reactions  . Benazepril     Hair falls out  . Etodolac     REACTION: Chest pain  . Imitrex [Sumatriptan Base]   . Meperidine Hcl     REACTION: Hives  . Morphine     REACTION: Hives  . Nickel     rash  . Penicillins     REACTION: Hives  . Simvastatin     Join pain and itching   Current Outpatient Prescriptions on File Prior to Visit  Medication Sig Dispense Refill  . aspirin 81 MG tablet Take 81 mg by mouth daily.        Marland Kitchen b complex vitamins tablet Take by mouth. 1/2 tablet daily       . calcium-vitamin D (OSCAL WITH D) 500-200 MG-UNIT per tablet Take 1 tablet by mouth daily.        . Cinnamon 500 MG capsule Take 1,000 mg by mouth daily.        . DULoxetine (CYMBALTA) 20 MG capsule Take 1 capsule (20 mg total) by mouth daily.  90 capsule  3  . estradiol (VIVELLE-DOT) 0.05 MG/24HR Place 1 patch (0.05 mg total) onto the skin 2 (two) times a week.  8  patch  11  . lansoprazole (PREVACID) 30 MG capsule TAKE 1 CAPSULE EVERY DAY  30 capsule  11  . promethazine (PHENERGAN) 12.5 MG tablet TAKE 1 TABLET BY MOUTH DAILY AS NEEDED FOR NAUSEA  30 tablet  0   No current facility-administered medications on file prior to visit.   Review of Systems Constitutional: Negative for diaphoresis, activity change, appetite change or unexpected weight change.  HENT: Negative for hearing loss, ear pain, facial swelling, mouth sores and neck stiffness.   Eyes: Negative for pain, redness and visual disturbance.  Respiratory: Negative for shortness of breath and wheezing.   Cardiovascular: Negative for chest pain and palpitations.  Gastrointestinal: Negative for  diarrhea, blood in stool, abdominal distention or other pain Genitourinary: Negative for hematuria, flank pain or change in urine volume.  Musculoskeletal: Negative for myalgias and joint swelling.  Skin: Negative for color change and wound.  Neurological: Negative for syncope and numbness. other than noted Hematological: Negative for adenopathy.  Psychiatric/Behavioral: Negative for hallucinations, self-injury, decreased concentration and agitation.      Objective:   Physical Exam BP 118/82  Pulse 90  Temp(Src) 98.6 F (37 C) (Oral)  Ht 5' 4.5" (1.638 m)  Wt 185 lb 12 oz (84.256 kg)  BMI 31.40 kg/m2  SpO2 98% VS noted,  Constitutional: Pt is oriented to person, place, and time. Appears well-developed and well-nourished.  Head: Normocephalic and atraumatic.  Right Ear: External ear normal.  Left Ear: External ear normal.  Nose: Nose normal.  Mouth/Throat: Oropharynx is clear and moist.  Eyes: Conjunctivae and EOM are normal. Pupils are equal, round, and reactive to light.  Neck: Normal range of motion. Neck supple. No JVD present. No tracheal deviation present.  Cardiovascular: Normal rate, regular rhythm, normal heart sounds and intact distal pulses.   Pulmonary/Chest: Effort normal and breath sounds normal.  Abdominal: Soft. Bowel sounds are normal. There is no tenderness. No HSM  Musculoskeletal: Normal range of motion. Exhibits no edema.  Lymphadenopathy:  Has no cervical adenopathy.  Neurological: Pt is alert and oriented to person, place, and time. Pt has normal reflexes. No cranial nerve deficit.  Skin: Skin is warm and dry. No rash noted.  Psychiatric:  Has  normal mood and affect. Behavior is normal. mild nervous Spine with diffuse chronic tender LS spine    Assessment & Plan:

## 2013-10-28 NOTE — Progress Notes (Signed)
Pre-visit discussion using our clinic review tool. No additional management support is needed unless otherwise documented below in the visit note.  

## 2013-10-28 NOTE — Assessment & Plan Note (Signed)
Improved with patch,  to f/u any worsening symptoms or concerns

## 2013-10-30 ENCOUNTER — Other Ambulatory Visit (INDEPENDENT_AMBULATORY_CARE_PROVIDER_SITE_OTHER): Payer: Medicare Other

## 2013-10-30 ENCOUNTER — Encounter: Payer: Self-pay | Admitting: Internal Medicine

## 2013-10-30 DIAGNOSIS — R768 Other specified abnormal immunological findings in serum: Secondary | ICD-10-CM

## 2013-10-30 DIAGNOSIS — R894 Abnormal immunological findings in specimens from other organs, systems and tissues: Secondary | ICD-10-CM

## 2013-10-30 DIAGNOSIS — Z0000 Encounter for general adult medical examination without abnormal findings: Secondary | ICD-10-CM

## 2013-10-30 DIAGNOSIS — E785 Hyperlipidemia, unspecified: Secondary | ICD-10-CM

## 2013-10-30 LAB — CBC WITH DIFFERENTIAL/PLATELET
Basophils Absolute: 0 10*3/uL (ref 0.0–0.1)
Basophils Relative: 0.7 % (ref 0.0–3.0)
Eosinophils Absolute: 0.1 10*3/uL (ref 0.0–0.7)
HCT: 38.3 % (ref 36.0–46.0)
Hemoglobin: 13.2 g/dL (ref 12.0–15.0)
Lymphocytes Relative: 41.1 % (ref 12.0–46.0)
Lymphs Abs: 2.5 10*3/uL (ref 0.7–4.0)
MCHC: 34.5 g/dL (ref 30.0–36.0)
MCV: 88.7 fl (ref 78.0–100.0)
Monocytes Relative: 5.1 % (ref 3.0–12.0)
Neutro Abs: 3.1 10*3/uL (ref 1.4–7.7)
RBC: 4.32 Mil/uL (ref 3.87–5.11)
RDW: 13 % (ref 11.5–14.6)

## 2013-10-30 LAB — LIPID PANEL
Cholesterol: 211 mg/dL — ABNORMAL HIGH (ref 0–200)
HDL: 77 mg/dL (ref >39.00)
Total CHOL/HDL Ratio: 3
Triglycerides: 32 mg/dL (ref 0.0–149.0)
VLDL: 6.4 mg/dL (ref 0.0–40.0)

## 2013-10-30 LAB — URINALYSIS, ROUTINE W REFLEX MICROSCOPIC
Hgb urine dipstick: NEGATIVE
Ketones, ur: NEGATIVE
Specific Gravity, Urine: 1.025 (ref 1.000–1.030)
Total Protein, Urine: NEGATIVE
Urine Glucose: NEGATIVE

## 2013-10-30 LAB — HEPATIC FUNCTION PANEL
Alkaline Phosphatase: 56 U/L (ref 39–117)
Bilirubin, Direct: 0.1 mg/dL (ref 0.0–0.3)
Total Bilirubin: 0.6 mg/dL (ref 0.3–1.2)

## 2013-10-30 LAB — BASIC METABOLIC PANEL
Calcium: 8.9 mg/dL (ref 8.4–10.5)
Creatinine, Ser: 0.8 mg/dL (ref 0.4–1.2)
GFR: 101.65 mL/min (ref >60.00)
Sodium: 140 mEq/L (ref 135–145)

## 2013-10-31 LAB — ANTI-DNA ANTIBODY, DOUBLE-STRANDED: ds DNA Ab: 3 IU/mL (ref <30)

## 2013-11-01 ENCOUNTER — Ambulatory Visit (INDEPENDENT_AMBULATORY_CARE_PROVIDER_SITE_OTHER): Payer: Medicare Other | Admitting: Family Medicine

## 2013-11-01 ENCOUNTER — Encounter: Payer: Self-pay | Admitting: Family Medicine

## 2013-11-01 VITALS — BP 150/90 | HR 86 | Temp 98.4°F | Resp 20 | Wt 181.0 lb

## 2013-11-01 DIAGNOSIS — J019 Acute sinusitis, unspecified: Secondary | ICD-10-CM

## 2013-11-01 MED ORDER — CEFUROXIME AXETIL 500 MG PO TABS: 500.0000 mg | ORAL_TABLET | Freq: Two times a day (BID) | ORAL | Status: DC

## 2013-11-01 NOTE — Assessment & Plan Note (Signed)
Cover with ceftin (pt is pcn all but says she has taken this before without problems and pref it) Disc symptomatic care - see instructions on AVS  Update if not starting to improve in a week or if worsening

## 2013-11-01 NOTE — Patient Instructions (Signed)
Drink lots of fluids  Nasal saline spray for congestion Take ceftin as directed for sinus infection Update if not starting to improve in a week or if worsening

## 2013-11-01 NOTE — Progress Notes (Signed)
Subjective:    Patient ID: Catherine Gardner, female    DOB: [birth year], 52 y.o.   MRN: [id number]  HPI Here for symptoms of a sinus infx Started with uri symptoms 2 mo ago and just not getting better  Having sinus pain and pressure   Nasal discharge- green in color  Fever -  Her usual temp is 97 so suspect low grade fever  Hot and cold   Also cough   R side of face and ear are painful and gland is swollen   Started taking zicam   Patient Active Problem List   Diagnosis Date Noted  . Menopausal symptoms 10/28/2013  . ANA positive 10/28/2013  . Chronic lower back pain 10/28/2013  . 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 Varicose veins with pain 05/06/2013  . Abdominal  pain, other specified site 09/16/2012  . Polyarthralgia 09/16/2012  . Palpitations 10/26/2011  . Back pain 10/26/2011  . Dyspepsia and other specified disorders of function of stomach 06/06/2011  . Preventative health care 02/27/2011  . GERD (gastroesophageal reflux disease) 02/27/2011  . ANXIETY 12/27/2010  . INSOMNIA 06/02/2010  . RASH-NONVESICULAR 06/02/2010  . SHINGLES 11/09/2009  . VITAMIN D DEFICIENCY 07/28/2009  . HYPERLIPIDEMIA 10/31/2007  . 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 DEPRESSION 10/31/2007  . HYPERTENSION 10/31/2007  . ARTHRITIS 10/31/2007  . POLYARTHRALGIA 10/31/2007  . LOW BACK PAIN 10/31/2007  . FIBROMYALGIA 10/31/2007  . OSTEOPENIA 10/31/2007   Past Medical History  Diagnosis Date  . SHINGLES 11/09/2009  . VITAMIN D DEFICIENCY 07/28/2009  . HYPERLIPIDEMIA 10/31/2007  . 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 DEPRESSION 10/31/2007  . HYPERTENSION 10/31/2007  . SINUSITIS- ACUTE-NOS 12/20/2009  . ARTHRITIS 10/31/2007  . POLYARTHRALGIA 10/31/2007  . LOW BACK PAIN 10/31/2007  . FIBROMYALGIA 10/31/2007  . OSTEOPENIA 10/31/2007  . INSOMNIA 06/02/2010  . RASH-NONVESICULAR 06/02/2010  . Headache(784.0) 07/12/2009  . ANXIETY 12/27/2010  . Chronic lower back  pain 10/28/2013   Past Surgical History  Procedure Laterality Date  . Abdominal hysterectomy    . S/p diskectomy  2004    L5  . Lumbar fusion  2008  . S/p right foot morton's neuroma  Jan. 2011   History  Substance Use Topics  . Smoking status: Never Smoker   . Smokeless tobacco: Not on file  . Alcohol Use: Yes     Comment: very very rare   Family History  Problem Relation Age of Onset  . ADD / ADHD Daughter   . Depression Daughter   . Hyperlipidemia Other   . Alcohol abuse Other     alcholism/addiction  . Crohn's disease Other   . Leukemia Father   . GER disease Mother   . Ovarian cancer Maternal Aunt   . Throat cancer Paternal Uncle    Allergies  Allergen Reactions  . Benazepril     Hair falls out  . Etodolac     REACTION: Chest pain  . Imitrex [Sumatriptan Base]   . Meperidine Hcl     REACTION: Hives  . Morphine     REACTION: Hives  . Nickel     rash  . Penicillins     REACTION: Hives  . Simvastatin     Join pain and itching   Current Outpatient Prescriptions on File Prior to Visit  Medication Sig Dispense Refill  . ALPRAZolam (XANAX) 0.25 MG tablet TAKE 1 TABLET BY MOUTH TWICE DAILY AS NEEDED  60 tablet  1  . aspirin 81 MG tablet  Take 81 mg by mouth daily.        Marland Kitchen b complex vitamins tablet Take by mouth. 1/2 tablet daily       . calcium-vitamin D (OSCAL WITH D) 500-200 MG-UNIT per tablet Take 1 tablet by mouth daily.        . Cinnamon 500 MG capsule Take 1,000 mg by mouth daily.        . DULoxetine (CYMBALTA) 20 MG capsule Take 1 capsule (20 mg total) by mouth daily.  90 capsule  3  . estradiol (VIVELLE-DOT) 0.05 MG/24HR Place 1 patch (0.05 mg total) onto the skin 2 (two) times a week.  8 patch  11  . promethazine (PHENERGAN) 12.5 MG tablet TAKE 1 TABLET BY MOUTH DAILY AS NEEDED FOR NAUSEA  30 tablet  0  . traMADol (ULTRAM) 50 MG tablet Take 1 tablet (50 mg total) by mouth every 6 (six) hours as needed.  60 tablet  0  . zolpidem (AMBIEN) 10 MG tablet  TAKE 1 TABLET AT BEDTIME AS NEEDED FOR SLEEP  90 tablet  1   No current facility-administered medications on file prior to visit.     Review of Systems Review of Systems  Constitutional: Negative for fever, appetite change,  and unexpected weight change.  ENt pos for congestion/ sinus pain / ear fullness  Eyes: Negative for pain and visual disturbance.  Respiratory: Negative for wheeze and shortness of breath.   Cardiovascular: Negative for cp or palpitations    Gastrointestinal: Negative for nausea, diarrhea and constipation.  Genitourinary: Negative for urgency and frequency.  Skin: Negative for pallor or rash   Neurological: Negative for weakness, light-headedness, numbness and headaches.  Hematological: Negative for adenopathy. Does not bruise/bleed easily.  Psychiatric/Behavioral: Negative for dysphoric mood. The patient is not nervous/anxious.         Objective:   Physical Exam  Constitutional: She appears well-developed and well-nourished. No distress.  HENT:  Head: Normocephalic and atraumatic.  Right Ear: External ear normal.  Left Ear: External ear normal.  Mouth/Throat: Oropharynx is clear and moist. No oropharyngeal exudate.  Nares are injected and congested   bilat maxillary and ethmoid sinus tenderness without swelling   Eyes: Conjunctivae and EOM are normal. Pupils are equal, round, and reactive to light. Right eye exhibits no discharge. Left eye exhibits no discharge.  Neck: Normal range of motion. Neck supple.  Cardiovascular: Normal rate and regular rhythm.   Pulmonary/Chest: Effort normal and breath sounds normal. No respiratory distress. She has no wheezes. She has no rales.  Lymphadenopathy:    She has no cervical adenopathy.  Neurological: She is alert.  Skin: Skin is warm and dry. No rash noted.  Psychiatric: She has a normal mood and affect.          Assessment & Plan:

## 2013-11-01 NOTE — Progress Notes (Signed)
Pre-visit discussion using our clinic review tool. No additional management support is needed unless otherwise documented below in the visit note.  

## 2013-11-11 ENCOUNTER — Encounter: Payer: Self-pay | Admitting: Internal Medicine

## 2014-01-12 ENCOUNTER — Other Ambulatory Visit: Payer: Self-pay

## 2014-01-12 DIAGNOSIS — Z1231 Encounter for screening mammogram for malignant neoplasm of breast: Secondary | ICD-10-CM

## 2014-01-30 ENCOUNTER — Ambulatory Visit: Payer: Medicare Other

## 2014-02-02 ENCOUNTER — Ambulatory Visit
Admission: RE | Admit: 2014-02-02 | Discharge: 2014-02-02 | Disposition: A | Discharge status: Home / Self Care | Payer: Medicare Other | Source: Ambulatory Visit

## 2014-02-02 DIAGNOSIS — Z1231 Encounter for screening mammogram for malignant neoplasm of breast: Secondary | ICD-10-CM

## 2014-02-23 ENCOUNTER — Ambulatory Visit (INDEPENDENT_AMBULATORY_CARE_PROVIDER_SITE_OTHER): Payer: Medicare Other | Admitting: Family Medicine

## 2014-02-23 ENCOUNTER — Encounter: Payer: Self-pay | Admitting: Family Medicine

## 2014-02-23 VITALS — BP 154/92 | HR 99 | Wt 190.0 lb

## 2014-02-23 DIAGNOSIS — M79609 Pain in unspecified limb: Secondary | ICD-10-CM

## 2014-02-23 DIAGNOSIS — M79601 Pain in right arm: Secondary | ICD-10-CM

## 2014-02-23 DIAGNOSIS — M62838 Other muscle spasm: Secondary | ICD-10-CM | POA: Insufficient documentation

## 2014-02-23 MED ORDER — CYCLOBENZAPRINE HCL 10 MG PO TABS: 10.0000 mg | ORAL_TABLET | Freq: Three times a day (TID) | ORAL | Status: DC | PRN

## 2014-02-23 MED ORDER — MELOXICAM 15 MG PO TABS: 15.0000 mg | ORAL_TABLET | Freq: Every day | ORAL | Status: DC

## 2014-02-23 NOTE — Progress Notes (Signed)
  [name] [address] [place], [place] [address] Phone: [phone] Subjective:     CC: Upper back and shoulder pain  BJY:NWGNFAOZHYHPI:Subjective Catherine LukesCynthia Gardner is a 53 y.o. female coming in with complaint of right upper back and shoulder pain. Patient states she has had this pain since Thursday and seems to be worsening. Patient has a past medical history significant for severe lumbar pathology having multiple surgeries including a electrical stimulator. Patient states that she does not remember any true injury but she stated having a dull aching cramping sensation in her right upper back it seems to be increasing in severity. Patient has found it difficult to do anything such as lifting her arm significantly but denies any weakness. Patient also denies any numbness but states that the pain does seem to radiate down her arm. Denies any significant neck pain. Denies any headache, visual changes or any chest pain. Denies any fevers or chills or any recent abnormal weight loss. Patient is a severity of the pain at 8/10 it is not responding to her medications that she has at home. Patient did take one of her husband's muscle relaxers which did help.     Past medical history, social, surgical and family history all reviewed in electronic medical record.   Review of Systems: No headache, visual changes, nausea, vomiting, diarrhea, constipation, dizziness, abdominal pain, skin rash, fevers, chills, night sweats, weight loss, swollen lymph nodes, body aches, joint swelling, muscle aches, chest pain, shortness of breath, mood changes.   Objective Blood pressure 154/92, pulse 99, weight 190 lb (86.183 kg), SpO2 97.00%.  General: No apparent distress alert and oriented x3 mood and affect normal, dressed appropriately. Overweight HEENT: Pupils equal, extraocular movements intact  Respiratory: Patient's speak in full sentences and does not appear short of breath  Cardiovascular: No  lower extremity edema, non tender, no erythema  Skin: Warm dry intact with no signs of infection or rash on extremities or on axial skeleton. Low back does have a large incision is well-healed Abdomen: Soft nontender  Neuro: Cranial nerves II through XII are intact, neurovascularly intact in all extremities with 2+ DTRs and 2+ pulses.  Lymph: No lymphadenopathy of posterior or anterior cervical chain or axillae bilaterally.  Gait normal with good balance and coordination.  MSK:  Non tender with full range of motion and good stability and symmetric strength and tone of shoulders, elbows, wrist, hip, knee and ankles bilaterally.  Patient does have a muscle spasm of the trapezius with multiple trigger points. Patient does have good range of motion of the neck. Patient has a negative Spurling status. Patient does have good range of motion with no impingement of the shoulder. She is neurovascularly intact distally of the right upper [place] with 5 out of 5 strength compared to the contralateral side. Good capillary refill.  After verbal consent patient was prepped with alcohol swabs and with a 25-gauge 1 inch needle was injected with 1 cc of 0.5% Marcaine and 1 cc of Kenalog 40 mg/dL and 5 trigger points in the right trapezius muscle. Patient did have 40% reduction in pain almost immediately. Post injection instructions given.    Impression and Recommendations:     This case required medical decision making of moderate complexity.

## 2014-02-23 NOTE — Assessment & Plan Note (Addendum)
She does have a trapezius muscle spasms I think is contributing. Patient does have underlying fibromyalgia that likely is also possibly exacerbating feature. Patient was given trigger point injections with good relief. Discuss home exercises and was given range of motion exercises that could be beneficial. Discussed NSAids and rx for muscle relaxant. Patient return in one week. If she continues to have trouble we may want to consider imaging including cervical and thoracic x-rays. In addition to this we may reconsider changing fibromyalgia medication. We'll some I would consider osteopathic manipulation. Followup in one week

## 2014-02-23 NOTE — Patient Instructions (Signed)
Good to meet you Try ice 20 minutes 2 times daily Ask about effexor Meloxicam daily for 10 days then as needed.  Flexeril as you need it up to 3 times daily try half first.  Try exercises 3 times a day Come back again in 1 week to make sure you are improving and will consider manipulation.

## 2014-03-02 ENCOUNTER — Encounter: Payer: Self-pay | Admitting: Family Medicine

## 2014-03-02 ENCOUNTER — Ambulatory Visit (INDEPENDENT_AMBULATORY_CARE_PROVIDER_SITE_OTHER): Payer: Medicare Other | Admitting: Family Medicine

## 2014-03-02 VITALS — BP 128/84 | HR 90

## 2014-03-02 DIAGNOSIS — IMO0001 Reserved for inherently not codable concepts without codable children: Secondary | ICD-10-CM

## 2014-03-02 DIAGNOSIS — M999 Biomechanical lesion, unspecified: Secondary | ICD-10-CM

## 2014-03-02 MED ORDER — VENLAFAXINE HCL 37.5 MG PO TABS: 37.5000 mg | ORAL_TABLET | Freq: Two times a day (BID) | ORAL | Status: DC

## 2014-03-02 NOTE — Progress Notes (Signed)
  Tawana ScaleZach Gardner D.O. [facility] [address] [place], [place] [address] Phone: [phone]([phone] Subjective:     CC: Upper back and shoulder pain follow up  OZD:[id number]:Subjective Catherine LukesCynthia Gardner is a 53 y.o. female coming in with complaint of right upper back and shoulder pain.  Follow up. Patient was having a muscle spasm and likely a fibromyalgia flare. Patient did have trigger point injections into the trapezius muscle and has been doing some home exercises. Patient states that she is approximately 80% better. Patient has been taking anti-inflammatories muscle relaxants on an as-needed basis. Patient still noticing some mild discomfort more in the shoulder. Patient states that the mid back and also for pain. Patient is wanting to know if medications for her fibromyalgia or manipulation could be helpful. No new symptoms.    Past medical history, social, surgical and family history all reviewed in electronic medical record.   Review of Systems: No headache, visual changes, nausea, vomiting, diarrhea, constipation, dizziness, abdominal pain, skin rash, fevers, chills, night sweats, weight loss, swollen lymph nodes, body aches, joint swelling, muscle aches, chest pain, shortness of breath, mood changes.   Objective Blood pressure 128/84, pulse 90, SpO2 97.00%.  General: No apparent distress alert and oriented x3 mood and affect normal, dressed appropriately. Overweight HEENT: Pupils equal, extraocular movements intact  Respiratory: Patient's speak in full sentences and does not appear short of breath  Cardiovascular: No lower extremity edema, non tender, no erythema  Skin: Warm dry intact with no signs of infection or rash on extremities or on axial skeleton. Low back does have a large incision is well-healed Abdomen: Soft nontender  Neuro: Cranial nerves II through XII are intact, neurovascularly intact in all extremities with 2+ DTRs and 2+ pulses.  Lymph: No lymphadenopathy of posterior or  anterior cervical chain or axillae bilaterally.  Gait normal with good balance and coordination.  MSK:  Non tender with full range of motion and good stability and symmetric strength and tone of shoulders, elbows, wrist, hip, knee and ankles bilaterally.  Patient does have a muscle spasm of the trapezius with multiple trigger points. Patient does have good range of motion of the neck. Patient has a negative Spurling status. Patient does have good range of motion with no impingement of the shoulder. She is neurovascularly intact distally of the right upper [place] with 5 out of 5 strength compared to the contralateral side. Good capillary refill.  Osteopathic findings  Thoracic T5 extended rotated and side bent left T7 extended rotated side bent right T9 extended rotated and side bent left  Lumbar L1 flexed rotated and side bent right  Sacrum Left on left    Impression and Recommendations:     This case required medical decision making of moderate complexity.

## 2014-03-02 NOTE — Patient Instructions (Addendum)
You are doing great! Tennis ball between shoulder blades with sitting for long amount of time Standing on wall with heels, butt, shoulders and head against wall for goal of 3 minutes daily.  Come back again in 3 weeks.

## 2014-03-02 NOTE — Assessment & Plan Note (Signed)
Patient is having some fibromyalgia-type pain I think possibly medications could be beneficial. Patient will be tried on Effexor which is a medication she has not done in the past. Patient wanted to avoid anything for weight gain do think this would be her best option. Warned the potential side effects. Patient will continue the other exercises and the importance of staying active. Patient will come back again in 3-4 weeks.

## 2014-03-02 NOTE — Assessment & Plan Note (Signed)
Spent greater than 25 minutes with patient face-to-face and had greater than 50% of counseling including as described above in assessment and plan.  Decision today to treat with OMT was based on Physical Exam  After verbal consent patient was treated with muscle energy techniques in thoracic, lumbar and sacral areas  Patient tolerated the procedure well with improvement in symptoms  Patient given exercises, stretches and lifestyle modifications  See medications in patient instructions if given  Patient will follow up in 3 weeks

## 2014-04-02 ENCOUNTER — Encounter: Payer: Self-pay | Admitting: Family Medicine

## 2014-04-02 ENCOUNTER — Ambulatory Visit (INDEPENDENT_AMBULATORY_CARE_PROVIDER_SITE_OTHER): Payer: Medicare Other | Admitting: Family Medicine

## 2014-04-02 VITALS — BP 134/82 | HR 80

## 2014-04-02 DIAGNOSIS — M62838 Other muscle spasm: Secondary | ICD-10-CM

## 2014-04-02 DIAGNOSIS — M999 Biomechanical lesion, unspecified: Secondary | ICD-10-CM

## 2014-04-02 NOTE — Progress Notes (Signed)
  Tawana ScaleZach Desirie Minteer D.O. [facility] [address] [place], [place] [address] Phone: [phone] Subjective:     CC: Upper back and shoulder pain follow up  WGN:[id number]:Subjective Catherine LukesCynthia Overdorf is a 53 y.o. female coming in with complaint of right upper back and shoulder pain.  Follow up. Patient was seen previously and did respond well to osteopathic for manipulation. Patient was also given Effexor for her fibromyalgia. Patient states that she did not like the side effects of and made her feel anxious. Patient stopped the medication after 3 days. Patient elected not be on the medication. Overall though at the exercises she may be doing somewhat better. Denies any nighttime awakening and denies any new symptoms. Muscle spasm that she's had her trapezius is significantly less intense but seems to be a greater amount of the muscle.    Past medical history, social, surgical and family history all reviewed in electronic medical record.   Review of Systems: No headache, visual changes, nausea, vomiting, diarrhea, constipation, dizziness, abdominal pain, skin rash, fevers, chills, night sweats, weight loss, swollen lymph nodes, body aches, joint swelling, muscle aches, chest pain, shortness of breath, mood changes.   Objective Blood pressure 134/82, pulse 80, SpO2 98.00%.  General: No apparent distress alert and oriented x3 mood and affect normal, dressed appropriately. Overweight HEENT: Pupils equal, extraocular movements intact  Respiratory: Patient's speak in full sentences and does not appear short of breath  Cardiovascular: No lower extremity edema, non tender, no erythema  Skin: Warm dry intact with no signs of infection or rash on extremities or on axial skeleton. Low back does have a large incision is well-healed Abdomen: Soft nontender  Neuro: Cranial nerves II through XII are intact, neurovascularly intact in all extremities with 2+ DTRs and 2+ pulses.  Lymph: No lymphadenopathy of  posterior or anterior cervical chain or axillae bilaterally.  Gait normal with good balance and coordination.  MSK:  Non tender with full range of motion and good stability and symmetric strength and tone of shoulders, elbows, wrist, hip, knee and ankles bilaterally.  No trigger pointing noted in the trapezius but still mild spasm compared to the contralateral side. Patient does have good range of motion of the neck. Patient has a negative Spurling status. Patient does have good range of motion with no impingement of the shoulder. She is neurovascularly intact distally   Osteopathic findings  Thoracic T5 extended rotated and side bent left T7 extended rotated side bent right  Lumbar L1 flexed rotated and side bent right  Sacrum Left on left    Impression and Recommendations:     This case required medical decision making of moderate complexity.

## 2014-04-02 NOTE — Assessment & Plan Note (Signed)
Negative xrays likely secondary to fibromyalgia, does not want no medication at this time though to try to help with muscle spasm. Patient though exam does seem to be moderately better than previous. Encourage her to continue to do what she is doing with the medications as well as the exercises. Patient is responding very well to osteopathic articulation will followup on 4-5 week intervals now. Discuss exercises again in great detail.  Spent greater than 25 minutes with patient face-to-face and had greater than 50% of counseling including as described above in assessment and plan.

## 2014-04-02 NOTE — Assessment & Plan Note (Signed)
Spent greater than 25 minutes with patient face-to-face and had greater than 50% of counseling including as described above in assessment and plan.  Decision today to treat with OMT was based on Physical Exam  After verbal consent patient was treated with muscle energy techniques in thoracic, lumbar and sacral areas  Patient tolerated the procedure well with improvement in symptoms  Patient given exercises, stretches and lifestyle modifications  See medications in patient instructions if given  Patient will follow up in 4-5 weeks

## 2014-04-02 NOTE — Patient Instructions (Signed)
You are doing great! Continue exercises  3 times a week Conitnue everything else you are doing.  We will see you again in 4-5 weeks.

## 2014-04-16 ENCOUNTER — Ambulatory Visit (INDEPENDENT_AMBULATORY_CARE_PROVIDER_SITE_OTHER): Payer: Medicare Other | Admitting: Internal Medicine

## 2014-04-16 ENCOUNTER — Encounter: Payer: Self-pay | Admitting: Internal Medicine

## 2014-04-16 VITALS — BP 148/100 | HR 73 | Temp 98.3°F | Ht 64.5 in | Wt 187.8 lb

## 2014-04-16 DIAGNOSIS — F329 Major depressive disorder, single episode, unspecified: Secondary | ICD-10-CM

## 2014-04-16 DIAGNOSIS — I10 Essential (primary) hypertension: Secondary | ICD-10-CM

## 2014-04-16 DIAGNOSIS — E785 Hyperlipidemia, unspecified: Secondary | ICD-10-CM

## 2014-04-16 DIAGNOSIS — F3289 Other specified depressive episodes: Secondary | ICD-10-CM

## 2014-04-16 MED ORDER — LABETALOL HCL 200 MG PO TABS: 200.0000 mg | ORAL_TABLET | Freq: Two times a day (BID) | ORAL | Status: DC

## 2014-04-16 NOTE — Patient Instructions (Signed)
Please take all new medication as prescribed  Please continue all other medications as before, and refills have been done if requested. Please have the pharmacy call with any other refills you may need.  Please continue your efforts at being more active, low cholesterol diet, and weight control.  Please return in 2 weeks, or sooner if needed

## 2014-04-16 NOTE — Progress Notes (Signed)
Subjective:    Patient ID: Catherine Gardner, female    DOB: 07/04/1961, 53 y.o.   MRN: [id number]  HPI  Here to f/u, unfortunately recently with persistent elev BP, last done yesterday at pain clinic 190/86 and 190/55.  C/o fatigue but Pt denies HA, chest pain, increased sob or doe, wheezing, orthopnea, PND, increased LE swelling, palpitations, dizziness or syncope. Drinks plenty of almost forced fluids every day.  Did also have a 2 days luau with higher salt recetnly as well.   For cataract surgury may 26, also for spinal stimulator removal per pain management in June 15.  Only took 1 dose effexor but stopped due to increased anxiety. Denies worsening depressive symptoms, suicidal ideation, or panic Past Medical History  Diagnosis Date  . SHINGLES 11/09/2009  . VITAMIN D DEFICIENCY 07/28/2009  . HYPERLIPIDEMIA 10/31/2007  . 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 DEPRESSION 10/31/2007  . HYPERTENSION 10/31/2007  . SINUSITIS- ACUTE-NOS 12/20/2009  . ARTHRITIS 10/31/2007  . POLYARTHRALGIA 10/31/2007  . LOW BACK PAIN 10/31/2007  . FIBROMYALGIA 10/31/2007  . OSTEOPENIA 10/31/2007  . INSOMNIA 06/02/2010  . RASH-NONVESICULAR 06/02/2010  . Headache(784.0) 07/12/2009  . ANXIETY 12/27/2010  . Chronic lower back pain 10/28/2013   Past Surgical History  Procedure Laterality Date  . Abdominal hysterectomy    . S/p diskectomy  2004    L5  . Lumbar fusion  2008  . S/p right foot morton's neuroma  Jan. 2011    reports that she has never smoked. She does not have any smokeless tobacco history on file. She reports that she drinks alcohol. She reports that she does not use illicit drugs. family history includes ADD / ADHD in her daughter; Alcohol abuse in her other; Crohn's disease in her other; Depression in her daughter; GER disease in her mother; Hyperlipidemia in her other; Leukemia in her father; Ovarian cancer in her maternal aunt; Throat cancer in her paternal uncle. Allergies  Allergen Reactions  .  Benazepril     Hair falls out  . Effexor [Venlafaxine]     incr anxiety  . Etodolac     REACTION: Chest pain  . Imitrex [Sumatriptan Base]   . Meperidine Hcl     REACTION: Hives  . Morphine     REACTION: Hives  . Nickel     rash  . Penicillins     REACTION: Hives  . Simvastatin     Join pain and itching   Current Outpatient Prescriptions on File Prior to Visit  Medication Sig Dispense Refill  . ALPRAZolam (XANAX) 0.25 MG tablet TAKE 1 TABLET BY MOUTH TWICE DAILY AS NEEDED  60 tablet  1  . aspirin 81 MG tablet Take 81 mg by mouth daily.        Marland Kitchen. b complex vitamins tablet Take by mouth. 1/2 tablet daily       . calcium-vitamin D (OSCAL WITH D) 500-200 MG-UNIT per tablet Take 1 tablet by mouth daily.        . Cinnamon 500 MG capsule Take 1,000 mg by mouth daily.        . cyclobenzaprine (FLEXERIL) 10 MG tablet Take 1 tablet (10 mg total) by mouth 3 (three) times daily as needed for muscle spasms.  30 tablet  0  . estradiol (VIVELLE-DOT) 0.05 MG/24HR Place 1 patch (0.05 mg total) onto the skin 2 (two) times a week.  8 patch  11  . Garlic 500 MG TABS Take by mouth.      .Marland Kitchen  meloxicam (MOBIC) 15 MG tablet Take 1 tablet (15 mg total) by mouth daily.  30 tablet  0  . Multiple Vitamins-Minerals (MULTIVITAMIN PO) Take by mouth.      . promethazine (PHENERGAN) 12.5 MG tablet TAKE 1 TABLET BY MOUTH DAILY AS NEEDED FOR NAUSEA  30 tablet  0  . traMADol (ULTRAM) 50 MG tablet Take 1 tablet (50 mg total) by mouth every 6 (six) hours as needed.  60 tablet  0  . zolpidem (AMBIEN) 10 MG tablet TAKE 1 TABLET AT BEDTIME AS NEEDED FOR SLEEP  90 tablet  1   No current facility-administered medications on file prior to visit.    Review of Systems  Constitutional: Negative for unusual diaphoresis or other sweats  HENT: Negative for ringing in ear Eyes: Negative for double vision or worsening visual disturbance.  Respiratory: Negative for choking and stridor.   Gastrointestinal: Negative for vomiting or  other signifcant bowel change Genitourinary: Negative for hematuria or decreased urine volume.  Musculoskeletal: Negative for other MSK pain or swelling Skin: Negative for color change and worsening wound.  Neurological: Negative for tremors and numbness other than noted  Psychiatric/Behavioral: Negative for decreased concentration or agitation other than above       Objective:   Physical Exam BP 148/100  Pulse 73  Temp(Src) 98.3 F (36.8 C) (Oral)  Ht 5' 4.5" (1.638 m)  Wt 187 lb 12 oz (85.163 kg)  BMI 31.74 kg/m2  SpO2 94% VS noted,  Constitutional: Pt appears well-developed, well-nourished.  HENT: Head: NCAT.  Right Ear: External ear normal.  Left Ear: External ear normal.  Eyes: . Pupils are equal, round, and reactive to light. Conjunctivae and EOM are normal Neck: Normal range of motion. Neck supple.  Cardiovascular: Normal rate and regular rhythm.   Pulmonary/Chest: Effort normal and breath sounds normal.  Abd:  Soft, NT, ND, + BS, no bruit Neurological: Pt is alert. Not confused , motor grossly intact Skin: Skin is warm. No rash Psychiatric: Pt behavior is normal. No agitation. , mild nervous only, not depressed affect    Assessment & Plan:

## 2014-04-16 NOTE — Assessment & Plan Note (Signed)
Mod to severe uncontrolled, essentially asympt, to avoid forced fluids, has allergy/intol to ACEI, will start labetolol 200 bid for control faster than amlod due to procedures upcoming, consider hct 12.5 next visit if not improved

## 2014-04-16 NOTE — Progress Notes (Signed)
Pre visit review using our clinic review tool, if applicable. No additional management support is needed unless otherwise documented below in the visit note. 

## 2014-04-16 NOTE — Assessment & Plan Note (Signed)
stable overall by history and exam, recent data reviewed with pt, and pt to continue medical treatment as before,  to f/u any worsening symptoms or concerns Lab Results  Component Value Date   LDLCALC 105* 12/27/2010

## 2014-04-16 NOTE — Assessment & Plan Note (Signed)
stable overall by history and exam, recent data reviewed with pt, and pt declines further tx for now,  to f/u any worsening symptoms or concerns,

## 2014-04-22 ENCOUNTER — Telehealth: Payer: Self-pay | Admitting: *Deleted

## 2014-04-22 MED ORDER — HYDROCHLOROTHIAZIDE 12.5 MG PO CAPS: 12.5000 mg | ORAL_CAPSULE | Freq: Every day | ORAL | Status: DC

## 2014-04-22 NOTE — Telephone Encounter (Signed)
Done erx 

## 2014-04-22 NOTE — Telephone Encounter (Signed)
Pt called states she is having more ankle swelling while on BP medication also states BP is elevated.  Pt is requesting a diuretic.  Please advise

## 2014-04-23 NOTE — Telephone Encounter (Signed)
Called the patient informed rx sent in. 

## 2014-05-01 ENCOUNTER — Encounter: Payer: Self-pay | Admitting: Internal Medicine

## 2014-05-01 ENCOUNTER — Ambulatory Visit (INDEPENDENT_AMBULATORY_CARE_PROVIDER_SITE_OTHER): Payer: Medicare Other | Admitting: Internal Medicine

## 2014-05-01 VITALS — BP 112/70 | HR 82 | Temp 98.2°F | Ht 64.5 in | Wt 188.1 lb

## 2014-05-01 DIAGNOSIS — E785 Hyperlipidemia, unspecified: Secondary | ICD-10-CM

## 2014-05-01 DIAGNOSIS — Z79890 Hormone replacement therapy (postmenopausal): Secondary | ICD-10-CM

## 2014-05-01 DIAGNOSIS — I10 Essential (primary) hypertension: Secondary | ICD-10-CM

## 2014-05-01 MED ORDER — ESTRADIOL 1 MG PO TABS: 1.0000 mg | ORAL_TABLET | Freq: Every day | ORAL | Status: DC

## 2014-05-01 NOTE — Progress Notes (Signed)
Pre visit review using our clinic review tool, if applicable. No additional management support is needed unless otherwise documented below in the visit note. 

## 2014-05-01 NOTE — Assessment & Plan Note (Signed)
Improved, now stable overall by history and exam, recent data reviewed with pt, and pt to continue medical treatment as before,  to f/u any worsening symptoms or concerns BP Readings from Last 3 Encounters:  05/01/14 112/70  04/16/14 148/100  04/02/14 134/82

## 2014-05-01 NOTE — Patient Instructions (Signed)
OK to stop the vivelle dot  Please take all new medication as prescribed - the generic estradiol   Please continue all other medications as before, and refills have been done if requested.  Please have the pharmacy call with any other refills you may need.  Please continue your efforts at being more active, low cholesterol diet, and weight control.  Please keep your appointments with your specialists as you may have planned

## 2014-05-01 NOTE — Assessment & Plan Note (Signed)
Ok to change to estradiol 1 mg qd , which is $10 at [facility] for 3 mo

## 2014-05-01 NOTE — Progress Notes (Signed)
Subjective:    Patient ID: Catherine Gardner, female    DOB: Apr 04, 1961, 53 y.o.   MRN: [id number]  HPI  Here to f/u; overall doing ok,  Pt denies chest pain, increased sob or doe, wheezing, orthopnea, PND, increased LE swelling, palpitations, dizziness or syncope.  Pt denies polydipsia, polyuria, or low sugar symptoms such as weakness or confusion improved with po intake.  Pt denies new neurological symptoms such as new headache, or facial or extremity weakness or numbness.   Pt states overall good compliance with meds, has been trying to follow lower cholesterol diet, with wt overall stable,  but little exercise however. Due for cataract off next wk.   Past Medical History  Diagnosis Date  . SHINGLES 11/09/2009  . VITAMIN D DEFICIENCY 07/28/2009  . HYPERLIPIDEMIA 10/31/2007  . 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 DEPRESSION 10/31/2007  . HYPERTENSION 10/31/2007  . SINUSITIS- ACUTE-NOS 12/20/2009  . ARTHRITIS 10/31/2007  . POLYARTHRALGIA 10/31/2007  . LOW BACK PAIN 10/31/2007  . FIBROMYALGIA 10/31/2007  . OSTEOPENIA 10/31/2007  . INSOMNIA 06/02/2010  . RASH-NONVESICULAR 06/02/2010  . Headache(784.0) 07/12/2009  . ANXIETY 12/27/2010  . Chronic lower back pain 10/28/2013   Past Surgical History  Procedure Laterality Date  . Abdominal hysterectomy    . S/p diskectomy  2004    L5  . Lumbar fusion  2008  . S/p right foot morton's neuroma  Jan. 2011    reports that she has never smoked. She does not have any smokeless tobacco history on file. She reports that she drinks alcohol. She reports that she does not use illicit drugs. family history includes ADD / ADHD in her daughter; Alcohol abuse in her other; Crohn's disease in her other; Depression in her daughter; GER disease in her mother; Hyperlipidemia in her other; Leukemia in her father; Ovarian cancer in her maternal aunt; Throat cancer in her paternal uncle. Allergies  Allergen Reactions  . Benazepril     Hair falls out  . Effexor [Venlafaxine]      incr anxiety  . Etodolac     REACTION: Chest pain  . Imitrex [Sumatriptan Base]   . Meperidine Hcl     REACTION: Hives  . Morphine     REACTION: Hives  . Nickel     rash  . Penicillins     REACTION: Hives  . Simvastatin     Join pain and itching   Current Outpatient Prescriptions on File Prior to Visit  Medication Sig Dispense Refill  . ALPRAZolam (XANAX) 0.25 MG tablet TAKE 1 TABLET BY MOUTH TWICE DAILY AS NEEDED  60 tablet  1  . aspirin 81 MG tablet Take 81 mg by mouth daily.        Marland Kitchen. b complex vitamins tablet Take by mouth. 1/2 tablet daily       . calcium-vitamin D (OSCAL WITH D) 500-200 MG-UNIT per tablet Take 1 tablet by mouth daily.        . Cinnamon 500 MG capsule Take 1,000 mg by mouth daily.        . cyclobenzaprine (FLEXERIL) 10 MG tablet Take 1 tablet (10 mg total) by mouth 3 (three) times daily as needed for muscle spasms.  30 tablet  0  . Garlic 500 MG TABS Take by mouth.      . hydrochlorothiazide (MICROZIDE) 12.5 MG capsule Take 1 capsule (12.5 mg total) by mouth daily.  30 capsule  11  . labetalol (NORMODYNE) 200 MG tablet Take 1 tablet (200 mg total)  by mouth 2 (two) times daily.  60 tablet  11  . meloxicam (MOBIC) 15 MG tablet Take 1 tablet (15 mg total) by mouth daily.  30 tablet  0  . Multiple Vitamins-Minerals (MULTIVITAMIN PO) Take by mouth.      . promethazine (PHENERGAN) 12.5 MG tablet TAKE 1 TABLET BY MOUTH DAILY AS NEEDED FOR NAUSEA  30 tablet  0  . traMADol (ULTRAM) 50 MG tablet Take 1 tablet (50 mg total) by mouth every 6 (six) hours as needed.  60 tablet  0  . zolpidem (AMBIEN) 10 MG tablet TAKE 1 TABLET AT BEDTIME AS NEEDED FOR SLEEP  90 tablet  1   No current facility-administered medications on file prior to visit.   Review of Systems  Constitutional: Negative for unusual diaphoresis or other sweats  HENT: Negative for ringing in ear Eyes: Negative for double vision or worsening visual disturbance.  Respiratory: Negative for choking and  stridor.   Gastrointestinal: Negative for vomiting or other signifcant bowel change Genitourinary: Negative for hematuria or decreased urine volume.  Musculoskeletal: Negative for other MSK pain or swelling Skin: Negative for color change and worsening wound.  Neurological: Negative for tremors and numbness other than noted  Psychiatric/Behavioral: Negative for decreased concentration or agitation other than above       Objective:   Physical Exam BP 112/70  Pulse 82  Temp(Src) 98.2 F (36.8 C) (Oral)  Ht 5' 4.5" (1.638 m)  Wt 188 lb 2 oz (85.333 kg)  BMI 31.80 kg/m2  SpO2 98% VS noted,  Constitutional: Pt appears well-developed, well-nourished.  HENT: Head: NCAT.  Right Ear: External ear normal.  Left Ear: External ear normal.  Eyes: . Pupils are equal, round, and reactive to light. Conjunctivae and EOM are normal Neck: Normal range of motion. Neck supple.  Cardiovascular: Normal rate and regular rhythm.   Pulmonary/Chest: Effort normal and breath sounds normal.  Abd:  Soft, NT, ND, + BS Neurological: Pt is alert. Not confused , motor grossly intact Skin: Skin is warm. No rash Psychiatric: Pt behavior is normal. No agitation.     Assessment & Plan:   BP Readings from Last 3 Encounters:  05/01/14 112/70  04/16/14 148/100  04/02/14 134/82

## 2014-05-01 NOTE — Assessment & Plan Note (Signed)
Mild elevated, delcines further statin at this time, to cont diet Lab Results  Component Value Date   LDLCALC 105* 12/27/2010

## 2014-05-04 ENCOUNTER — Telehealth: Payer: Self-pay | Admitting: Internal Medicine

## 2014-05-04 NOTE — Telephone Encounter (Signed)
Relevant patient education assigned to patient using Emmi. ° °

## 2014-06-15 ENCOUNTER — Other Ambulatory Visit: Payer: Self-pay | Admitting: Internal Medicine

## 2014-06-15 NOTE — Telephone Encounter (Signed)
#   30, No refill

## 2014-06-15 NOTE — Telephone Encounter (Signed)
Printed and put on MD's desk to sign 

## 2014-07-12 ENCOUNTER — Other Ambulatory Visit: Payer: Self-pay | Admitting: Internal Medicine

## 2014-07-17 ENCOUNTER — Telehealth: Payer: Self-pay | Admitting: Internal Medicine

## 2014-07-17 MED ORDER — ZOLPIDEM TARTRATE 10 MG PO TABS: ORAL_TABLET | ORAL | Status: DC

## 2014-07-17 NOTE — Telephone Encounter (Signed)
Needs refill on ambien [facility] to [facility] [address]   Patient is out of med.

## 2014-07-17 NOTE — Telephone Encounter (Signed)
[facility] [place] [facility] called the patient informed script sent in.

## 2014-07-17 NOTE — Telephone Encounter (Signed)
Done hardcopy to robin  

## 2014-08-13 ENCOUNTER — Ambulatory Visit: Payer: Medicare Other | Admitting: Family Medicine

## 2014-09-10 ENCOUNTER — Other Ambulatory Visit: Payer: Self-pay | Admitting: Orthopaedic Surgery

## 2014-09-10 DIAGNOSIS — G8929 Other chronic pain: Secondary | ICD-10-CM

## 2014-09-10 DIAGNOSIS — M545 Low back pain: Principal | ICD-10-CM

## 2014-09-14 ENCOUNTER — Other Ambulatory Visit: Payer: Self-pay

## 2014-09-14 MED ORDER — LABETALOL HCL 200 MG PO TABS: 200.0000 mg | ORAL_TABLET | Freq: Two times a day (BID) | ORAL | Status: DC

## 2014-09-14 MED ORDER — HYDROCHLOROTHIAZIDE 12.5 MG PO CAPS: 12.5000 mg | ORAL_CAPSULE | Freq: Every day | ORAL | Status: DC

## 2014-09-17 ENCOUNTER — Other Ambulatory Visit: Payer: Medicare Other

## 2014-09-24 ENCOUNTER — Other Ambulatory Visit: Payer: Medicare Other

## 2014-09-29 ENCOUNTER — Telehealth: Payer: Self-pay | Admitting: Internal Medicine

## 2014-09-29 DIAGNOSIS — M545 Low back pain: Secondary | ICD-10-CM

## 2014-09-29 DIAGNOSIS — M533 Sacrococcygeal disorders, not elsewhere classified: Secondary | ICD-10-CM

## 2014-09-29 NOTE — Telephone Encounter (Signed)
Pt fell 09/22/14 on tailbone, very painful now lump-bump on tailbone. Pt requesting work in with Dr Jonny RuizJohn - no openings in this clinic today, please advise.

## 2014-09-29 NOTE — Telephone Encounter (Signed)
Ok for [facility] today, then OV tomorrow please

## 2014-09-30 ENCOUNTER — Ambulatory Visit (INDEPENDENT_AMBULATORY_CARE_PROVIDER_SITE_OTHER): Payer: Medicare Other | Admitting: Internal Medicine

## 2014-09-30 ENCOUNTER — Encounter: Payer: Self-pay | Admitting: Internal Medicine

## 2014-09-30 VITALS — BP 134/86 | HR 78 | Temp 99.2°F | Wt 186.0 lb

## 2014-09-30 DIAGNOSIS — M8580 Other specified disorders of bone density and structure, unspecified site: Secondary | ICD-10-CM

## 2014-09-30 DIAGNOSIS — S322XXS Fracture of coccyx, sequela: Secondary | ICD-10-CM

## 2014-09-30 DIAGNOSIS — K649 Unspecified hemorrhoids: Secondary | ICD-10-CM

## 2014-09-30 DIAGNOSIS — I10 Essential (primary) hypertension: Secondary | ICD-10-CM

## 2014-09-30 MED ORDER — LIDOCAINE-HYDROCORTISONE ACE 3-0.5 % RE CREA: 1.0000 | TOPICAL_CREAM | Freq: Two times a day (BID) | RECTAL | Status: DC

## 2014-09-30 NOTE — Progress Notes (Signed)
Pre visit review using our clinic review tool, if applicable. No additional management support is needed unless otherwise documented below in the visit note. 

## 2014-09-30 NOTE — Progress Notes (Signed)
Subjective:    Patient ID: Catherine LukesCynthia Gardner, female    DOB: September 08, 1961, 53 y.o.   MRN: [id number]  HPI  Here with acute onset rectal pain ? Hemorrhoid, complicating recent fall with known coccyx fx. No bleeding but pain 6/10, tolerable and o/w declines surgical intervention.  Pt denies chest pain, increased sob or doe, wheezing, orthopnea, PND, increased LE swelling, palpitations, dizziness or syncope.  Pt denies new neurological symptoms such as new headache, or facial or extremity weakness or numbness   Past Medical History  Diagnosis Date  . SHINGLES 11/09/2009  . VITAMIN D DEFICIENCY 07/28/2009  . HYPERLIPIDEMIA 10/31/2007  . 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 DEPRESSION 10/31/2007  . HYPERTENSION 10/31/2007  . SINUSITIS- ACUTE-NOS 12/20/2009  . ARTHRITIS 10/31/2007  . POLYARTHRALGIA 10/31/2007  . LOW BACK PAIN 10/31/2007  . FIBROMYALGIA 10/31/2007  . OSTEOPENIA 10/31/2007  . INSOMNIA 06/02/2010  . RASH-NONVESICULAR 06/02/2010  . Headache(784.0) 07/12/2009  . ANXIETY 12/27/2010  . Chronic lower back pain 10/28/2013   Past Surgical History  Procedure Laterality Date  . Abdominal hysterectomy    . S/p diskectomy  2004    L5  . Lumbar fusion  2008  . S/p right foot morton's neuroma  Jan. 2011    reports that she has never smoked. She does not have any smokeless tobacco history on file. She reports that she drinks alcohol. She reports that she does not use illicit drugs. family history includes ADD / ADHD in her daughter; Alcohol abuse in her other; Crohn's disease in her other; Depression in her daughter; GER disease in her mother; Hyperlipidemia in her other; Leukemia in her father; Ovarian cancer in her maternal aunt; Throat cancer in her paternal uncle. Allergies  Allergen Reactions  . Etodolac Other (See Comments)    Chest pain  . Meperidine Hcl Hives       . Morphine Hives  . Penicillins Hives       . Effexor [Venlafaxine]     incr anxiety  . Imitrex [Sumatriptan Base]   .  Benazepril Other (See Comments)    Hair falls out  . Nickel Rash       . Simvastatin Other (See Comments)    Join pain and itching  . Tramadol Other (See Comments)    Hot flashes   Current Outpatient Prescriptions on File Prior to Visit  Medication Sig Dispense Refill  . ALPRAZolam (XANAX) 0.25 MG tablet TAKE 1 TABLET BY MOUTH TWICE DAILY AS NEEDED 60 tablet 1  . aspirin 81 MG tablet Take 81 mg by mouth daily.      Marland Kitchen. b complex vitamins tablet Take by mouth. 1/2 tablet daily     . calcium-vitamin D (OSCAL WITH D) 500-200 MG-UNIT per tablet Take 1 tablet by mouth daily.      . Cinnamon 500 MG capsule Take 1,000 mg by mouth daily.      . cyclobenzaprine (FLEXERIL) 10 MG tablet Take 1 tablet (10 mg total) by mouth 3 (three) times daily as needed for muscle spasms. 30 tablet 0  . estradiol (ESTRACE) 1 MG tablet Take 1 tablet (1 mg total) by mouth daily. 90 tablet 3  . Garlic 500 MG TABS Take by mouth.    . hydrochlorothiazide (MICROZIDE) 12.5 MG capsule Take 1 capsule (12.5 mg total) by mouth daily. 90 capsule 1  . labetalol (NORMODYNE) 200 MG tablet Take 1 tablet (200 mg total) by mouth 2 (two) times daily. 180 tablet 1  . meloxicam (MOBIC) 15 MG  tablet Take 1 tablet (15 mg total) by mouth daily. 30 tablet 0  . Multiple Vitamins-Minerals (MULTIVITAMIN PO) Take by mouth.    . promethazine (PHENERGAN) 12.5 MG tablet TAKE 1 TABLET BY MOUTH DAILY AS NEEDED FOR NAUSEA 30 tablet 0  . traMADol (ULTRAM) 50 MG tablet Take 1 tablet (50 mg total) by mouth every 6 (six) hours as needed. 60 tablet 0  . zolpidem (AMBIEN) 10 MG tablet TAKE 1 TABLET BY MOUTH AT BEDTIME AS NEEDED FOR SLEEP 30 tablet 5   No current facility-administered medications on file prior to visit.   Review of Systems  Constitutional: Negative for unusual diaphoresis or other sweats  HENT: Negative for ringing in ear Eyes: Negative for double vision or worsening visual disturbance.  Respiratory: Negative for choking and stridor.     Gastrointestinal: Negative for vomiting or other signifcant bowel change Genitourinary: Negative for hematuria or decreased urine volume.  Musculoskeletal: Negative for other MSK pain or swelling Skin: Negative for color change and worsening wound.  Neurological: Negative for tremors and numbness other than noted  Psychiatric/Behavioral: Negative for decreased concentration or agitation other than above       Objective:   Physical Exam BP 134/86 mmHg  Pulse 78  Temp(Src) 99.2 F (37.3 C) (Oral)  Wt 186 lb (84.369 kg)  SpO2 97% VS noted,  Constitutional: Pt appears well-developed, well-nourished.  HENT: Head: NCAT.  Right Ear: External ear normal.  Left Ear: External ear normal.  Eyes: . Pupils are equal, round, and reactive to light. Conjunctivae and EOM are normal Neck: Normal range of motion. Neck supple.  Cardiovascular: Normal rate and regular rhythm.   Pulmonary/Chest: Effort normal and breath sounds normal.  Abd:  Soft, NT, ND, + BS Rectal: + mod tender perirectal ext thrombosed hemorrhoid Neurological: Pt is alert. Not confused , motor grossly intact Skin: Skin is warm. No rash Psychiatric: Pt behavior is normal. No agitation.     Assessment & Plan:

## 2014-09-30 NOTE — Patient Instructions (Signed)
Please take all new medication as prescribed  Please continue all other medications as before, and refills have been done if requested.  Please have the pharmacy call with any other refills you may need.  Please keep your appointments with your specialists as you may have planned  Good luck with your myelogram tomorrow

## 2014-10-01 ENCOUNTER — Ambulatory Visit
Admission: RE | Admit: 2014-10-01 | Discharge: 2014-10-01 | Disposition: A | Discharge status: Home / Self Care | Payer: Medicare Other | Source: Ambulatory Visit | Attending: Orthopaedic Surgery | Admitting: Orthopaedic Surgery

## 2014-10-01 DIAGNOSIS — G8929 Other chronic pain: Secondary | ICD-10-CM

## 2014-10-01 DIAGNOSIS — M545 Low back pain, unspecified: Secondary | ICD-10-CM

## 2014-10-01 MED ORDER — IOHEXOL 180 MG/ML  SOLN
15.0000 mL | Freq: Once | INTRAMUSCULAR | Status: AC | PRN
  Administered 2014-10-01: 15 mL via INTRATHECAL

## 2014-10-01 MED ORDER — DIAZEPAM 5 MG PO TABS
10.0000 mg | ORAL_TABLET | Freq: Once | ORAL | Status: AC
  Administered 2014-10-01: 10 mg via ORAL

## 2014-10-01 MED ORDER — OXYCODONE-ACETAMINOPHEN 5-325 MG PO TABS
1.0000 | ORAL_TABLET | Freq: Once | ORAL | Status: AC
  Administered 2014-10-01: 1 via ORAL

## 2014-10-01 MED ORDER — ONDANSETRON 8 MG PO TBDP
8.0000 mg | ORAL_TABLET | Freq: Once | ORAL | Status: AC
Start: 2014-10-01 — End: 2014-10-01
  Administered 2014-10-01: 8 mg via ORAL
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
## 2014-10-01 NOTE — Progress Notes (Signed)
Pt states she has been off Phenergan and tramadol for the past week, discharge instructions explained to pt.

## 2014-10-04 DIAGNOSIS — S322XXA Fracture of coccyx, initial encounter for closed fracture: Secondary | ICD-10-CM | POA: Insufficient documentation

## 2014-10-04 DIAGNOSIS — K649 Unspecified hemorrhoids: Secondary | ICD-10-CM | POA: Insufficient documentation

## 2014-10-04 DIAGNOSIS — M8580 Other specified disorders of bone density and structure, unspecified site: Secondary | ICD-10-CM | POA: Insufficient documentation

## 2014-10-04 NOTE — Assessment & Plan Note (Signed)
Declines other need for pain control, has donut to sit, will need DXA as this occurred with low level fall

## 2014-10-04 NOTE — Assessment & Plan Note (Signed)
Mild to mod, for [facility], declines surgical intervention  to f/u any worsening symptoms or concerns

## 2014-10-04 NOTE — Assessment & Plan Note (Signed)
stable overall by history and exam, recent data reviewed with pt, and pt to continue medical treatment as before,  to f/u any worsening symptoms or concerns BP Readings from Last 3 Encounters:  10/01/14 112/53  09/30/14 134/86  05/01/14 112/70

## 2014-10-04 NOTE — Assessment & Plan Note (Signed)
For dxa as above,  to f/u any worsening symptoms or concerns

## 2014-10-09 ENCOUNTER — Other Ambulatory Visit: Payer: Medicare Other

## 2014-10-28 ENCOUNTER — Telehealth: Payer: Self-pay

## 2014-10-28 NOTE — Telephone Encounter (Signed)
-----   Message from Corwin LevinsJames W John, MD sent at 10/28/2014  9:01 AM EST ----- Regarding: RE: dxa No, ok to hold off on DXA at this time, as she has had one so recently, as the results after such a short period of time are extremely unlikely to be changed or otherwise helpful in determining futher treatment. Follow up DXA scans are usually done at 2-3 yrs.  I can refer her to endocrinology if she likes, but I suspect she would have a similar recommendation. ----- Message -----    From: Vladimir Croftsobin B Anijah Spohr    Sent: 10/28/2014   8:40 AM      To: Corwin LevinsJames W John, MD Subject: FW: dxa                                          ----- Message -----    From: Thressa Shellerammy L Peace    Sent: 10/28/2014   8:38 AM      To: Corwin LevinsJames W John, MD, Vladimir Croftsobin B Javarious Elsayed Subject: RE: dxa                                        Patient states DXA was cancelled b/c she has had one within the last year.  She is concerned about the fall she had and would like to check into having one.  Would this be something I need to check into on cost out of pocket?  ----- Message -----    From: Corwin LevinsJames W John, MD    Sent: 09/30/2014   5:28 PM      To: Thressa Shellerammy L Peace Subject: dxa                                            Please call pt to schedule DXA (I have ordered)

## 2014-10-28 NOTE — Telephone Encounter (Signed)
Called the patient left a msg. To call back

## 2014-10-28 NOTE — Telephone Encounter (Signed)
Patient informed. 

## 2014-11-05 ENCOUNTER — Encounter: Payer: Medicare Other | Admitting: Internal Medicine

## 2014-11-18 ENCOUNTER — Emergency Department (HOSPITAL_COMMUNITY): Payer: No Typology Code available for payment source

## 2014-11-18 ENCOUNTER — Emergency Department (HOSPITAL_COMMUNITY)
Admission: EM | Admit: 2014-11-18 | Discharge: 2014-11-18 | Disposition: A | Discharge status: Home / Self Care | Payer: No Typology Code available for payment source | Attending: Emergency Medicine | Admitting: Emergency Medicine

## 2014-11-18 ENCOUNTER — Encounter (HOSPITAL_COMMUNITY): Payer: Self-pay | Admitting: Emergency Medicine

## 2014-11-18 DIAGNOSIS — Z79899 Other long term (current) drug therapy: Secondary | ICD-10-CM | POA: Diagnosis not present

## 2014-11-18 DIAGNOSIS — M549 Dorsalgia, unspecified: Secondary | ICD-10-CM

## 2014-11-18 DIAGNOSIS — M545 Low back pain, unspecified: Secondary | ICD-10-CM

## 2014-11-18 DIAGNOSIS — Z8619 Personal history of other infectious and parasitic diseases: Secondary | ICD-10-CM | POA: Insufficient documentation

## 2014-11-18 DIAGNOSIS — Z862 Personal history of diseases of the blood and blood-forming organs and certain disorders involving the immune mechanism: Secondary | ICD-10-CM | POA: Insufficient documentation

## 2014-11-18 DIAGNOSIS — M1990 Unspecified osteoarthritis, unspecified site: Secondary | ICD-10-CM | POA: Diagnosis not present

## 2014-11-18 DIAGNOSIS — E559 Vitamin D deficiency, unspecified: Secondary | ICD-10-CM | POA: Diagnosis not present

## 2014-11-18 DIAGNOSIS — G8929 Other chronic pain: Secondary | ICD-10-CM | POA: Insufficient documentation

## 2014-11-18 DIAGNOSIS — Y9389 Activity, other specified: Secondary | ICD-10-CM | POA: Diagnosis not present

## 2014-11-18 DIAGNOSIS — F329 Major depressive disorder, single episode, unspecified: Secondary | ICD-10-CM | POA: Insufficient documentation

## 2014-11-18 DIAGNOSIS — Z8709 Personal history of other diseases of the respiratory system: Secondary | ICD-10-CM | POA: Insufficient documentation

## 2014-11-18 DIAGNOSIS — Y998 Other external cause status: Secondary | ICD-10-CM | POA: Insufficient documentation

## 2014-11-18 DIAGNOSIS — Z880 Allergy status to penicillin: Secondary | ICD-10-CM | POA: Insufficient documentation

## 2014-11-18 DIAGNOSIS — Z7982 Long term (current) use of aspirin: Secondary | ICD-10-CM | POA: Insufficient documentation

## 2014-11-18 DIAGNOSIS — S3992XA Unspecified injury of lower back, initial encounter: Secondary | ICD-10-CM | POA: Insufficient documentation

## 2014-11-18 DIAGNOSIS — Y92410 Unspecified street and highway as the place of occurrence of the external cause: Secondary | ICD-10-CM | POA: Insufficient documentation

## 2014-11-18 DIAGNOSIS — Z7952 Long term (current) use of systemic steroids: Secondary | ICD-10-CM | POA: Diagnosis not present

## 2014-11-18 DIAGNOSIS — I10 Essential (primary) hypertension: Secondary | ICD-10-CM | POA: Diagnosis not present

## 2014-11-18 HISTORY — DX: Meningitis, unspecified: G03.9

## 2014-11-18 MED ORDER — ONDANSETRON 4 MG PO TBDP
8.0000 mg | ORAL_TABLET | Freq: Once | ORAL | Status: AC
  Administered 2014-11-18: 8 mg via ORAL
  Filled 2014-11-18: qty 2

## 2014-11-18 MED ORDER — OXYCODONE-ACETAMINOPHEN 5-325 MG PO TABS
2.0000 | ORAL_TABLET | Freq: Once | ORAL | Status: AC
  Administered 2014-11-18: 2 via ORAL
  Filled 2014-11-18: qty 2

## 2014-11-18 MED ORDER — OXYCODONE-ACETAMINOPHEN 5-325 MG PO TABS: 2.0000 | ORAL_TABLET | ORAL | Status: DC | PRN
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
## 2014-11-18 NOTE — Discharge Instructions (Signed)

## 2014-11-18 NOTE — ED Notes (Signed)
MD at bedside. 
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
## 2014-11-18 NOTE — ED Notes (Signed)
Patient ambulated with no issues, stated pain was much better. NAD

## 2014-12-04 ENCOUNTER — Ambulatory Visit (INDEPENDENT_AMBULATORY_CARE_PROVIDER_SITE_OTHER): Payer: Medicare Other | Admitting: Family Medicine

## 2014-12-04 ENCOUNTER — Encounter: Payer: Self-pay | Admitting: Family Medicine

## 2014-12-04 VITALS — BP 128/82 | HR 89 | Ht 64.5 in | Wt 187.0 lb

## 2014-12-04 DIAGNOSIS — M25512 Pain in left shoulder: Secondary | ICD-10-CM

## 2014-12-04 DIAGNOSIS — S134XXA Sprain of ligaments of cervical spine, initial encounter: Secondary | ICD-10-CM

## 2014-12-04 DIAGNOSIS — M545 Low back pain, unspecified: Secondary | ICD-10-CM

## 2014-12-04 DIAGNOSIS — G8929 Other chronic pain: Secondary | ICD-10-CM

## 2014-12-04 MED ORDER — MELOXICAM 15 MG PO TABS: 15.0000 mg | ORAL_TABLET | Freq: Every day | ORAL | Status: DC

## 2014-12-04 MED ORDER — BACLOFEN 10 MG PO TABS: 10.0000 mg | ORAL_TABLET | Freq: Three times a day (TID) | ORAL | Status: DC

## 2014-12-04 MED ORDER — CYCLOBENZAPRINE HCL 10 MG PO TABS: 10.0000 mg | ORAL_TABLET | Freq: Three times a day (TID) | ORAL | Status: DC | PRN

## 2014-12-04 NOTE — Assessment & Plan Note (Signed)
Patient was given trigger point injections today. We did not do any manipulation secondary to the amount of muscle spasm today. Patient would like to consider this in the future. Patient was given a prescription for anti-inflammatory as well as muscle relaxer. We discussed icing as well as heating protocol. Patient was given home exercises and showed proper technique. Patient will make these changes and come back and see me again in 2 weeks
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
## 2014-12-04 NOTE — Patient Instructions (Signed)
Good to see you Flexeril at night and half tab  During day when needed Meloxicam daily for 10 days then as needed Heat before activity for 10 minutes and ice 10 minutes after activity.  Exercises 3-5 times a week See me again in 2 weeks.

## 2014-12-05 DIAGNOSIS — M25512 Pain in left shoulder: Secondary | ICD-10-CM | POA: Insufficient documentation

## 2014-12-05 NOTE — Assessment & Plan Note (Signed)
No loosening of posterior fusion noted on x-rays taken in the emergency department. We'll continue to follow-up with surgeon.

## 2014-12-18 ENCOUNTER — Ambulatory Visit: Payer: Medicare Other | Admitting: Family Medicine

## 2014-12-25 ENCOUNTER — Ambulatory Visit (INDEPENDENT_AMBULATORY_CARE_PROVIDER_SITE_OTHER): Payer: Medicare Other | Admitting: Family Medicine

## 2014-12-25 ENCOUNTER — Encounter: Payer: Self-pay | Admitting: Family Medicine

## 2014-12-25 VITALS — BP 118/80 | HR 83 | Ht 64.5 in | Wt 186.0 lb

## 2014-12-25 DIAGNOSIS — M9903 Segmental and somatic dysfunction of lumbar region: Secondary | ICD-10-CM

## 2014-12-25 DIAGNOSIS — S134XXD Sprain of ligaments of cervical spine, subsequent encounter: Secondary | ICD-10-CM

## 2014-12-25 DIAGNOSIS — M9901 Segmental and somatic dysfunction of cervical region: Secondary | ICD-10-CM

## 2014-12-25 DIAGNOSIS — M999 Biomechanical lesion, unspecified: Secondary | ICD-10-CM | POA: Insufficient documentation

## 2014-12-25 DIAGNOSIS — M9902 Segmental and somatic dysfunction of thoracic region: Secondary | ICD-10-CM
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
## 2014-12-25 NOTE — Assessment & Plan Note (Signed)
Spent greater than 25 minutes with patient face-to-face and had greater than 50% of counseling including as described above in assessment and plan.  Decision today to treat with OMT was based on Physical Exam  After verbal consent patient was treated with muscle energy techniques in thoracic, cervical and rib areas  Patient tolerated the procedure well with improvement in symptoms  Patient given exercises, stretches and lifestyle modifications  See medications in patient instructions if given  Patient will follow up in 4-5 weeks
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
## 2014-12-25 NOTE — Progress Notes (Signed)
Pre visit review using our clinic review tool, if applicable. No additional management support is needed unless otherwise documented below in the visit note. 

## 2014-12-25 NOTE — Patient Instructions (Signed)
Good to see you Ice is your friend Try the Duexis up to 3 times a day  See me again in 3 weeks.

## 2015-01-13 ENCOUNTER — Other Ambulatory Visit: Payer: Self-pay | Admitting: Internal Medicine

## 2015-01-14 NOTE — Telephone Encounter (Signed)
Faxed script back to [facility].../lmb 

## 2015-01-14 NOTE — Telephone Encounter (Signed)
Done hardcopy to rachel  

## 2015-01-15 ENCOUNTER — Encounter: Payer: Self-pay | Admitting: Family Medicine

## 2015-01-15 ENCOUNTER — Ambulatory Visit (INDEPENDENT_AMBULATORY_CARE_PROVIDER_SITE_OTHER): Payer: Medicare Other | Admitting: Family Medicine

## 2015-01-15 VITALS — BP 130/80 | HR 90 | Ht 64.5 in | Wt 187.0 lb

## 2015-01-15 DIAGNOSIS — M9903 Segmental and somatic dysfunction of lumbar region: Secondary | ICD-10-CM

## 2015-01-15 DIAGNOSIS — M2550 Pain in unspecified joint: Secondary | ICD-10-CM

## 2015-01-15 DIAGNOSIS — M9901 Segmental and somatic dysfunction of cervical region: Secondary | ICD-10-CM

## 2015-01-15 DIAGNOSIS — M9902 Segmental and somatic dysfunction of thoracic region: Secondary | ICD-10-CM

## 2015-01-15 DIAGNOSIS — M999 Biomechanical lesion, unspecified: Secondary | ICD-10-CM
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
## 2015-01-15 NOTE — Assessment & Plan Note (Signed)
Decision today to treat with OMT was based on Physical Exam  After verbal consent patient was treated with muscle energy techniques in thoracic, cervical and rib areas  Patient tolerated the procedure well with improvement in symptoms  Patient given exercises, stretches and lifestyle modifications  See medications in patient instructions if given  Patient will follow up in 3-4 weeks          

## 2015-01-15 NOTE — Patient Instructions (Addendum)
Good to see you Continue with ice when you need it.  I think the vitamins are helping.  Continue to watch how you lifting.  See me again in  3 weeks.
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
## 2015-01-15 NOTE — Progress Notes (Signed)
Pre visit review using our clinic review tool, if applicable. No additional management support is needed unless otherwise documented below in the visit note. 

## 2015-01-29 ENCOUNTER — Encounter: Payer: Self-pay | Admitting: Family Medicine

## 2015-01-29 ENCOUNTER — Ambulatory Visit (INDEPENDENT_AMBULATORY_CARE_PROVIDER_SITE_OTHER): Payer: Medicare Other | Admitting: Family Medicine

## 2015-01-29 VITALS — BP 132/80 | HR 77 | Ht 64.5 in | Wt 187.0 lb

## 2015-01-29 DIAGNOSIS — M9903 Segmental and somatic dysfunction of lumbar region: Secondary | ICD-10-CM

## 2015-01-29 DIAGNOSIS — M549 Dorsalgia, unspecified: Secondary | ICD-10-CM

## 2015-01-29 DIAGNOSIS — M9904 Segmental and somatic dysfunction of sacral region: Secondary | ICD-10-CM

## 2015-01-29 DIAGNOSIS — M9901 Segmental and somatic dysfunction of cervical region: Secondary | ICD-10-CM

## 2015-01-29 DIAGNOSIS — M2550 Pain in unspecified joint: Secondary | ICD-10-CM

## 2015-01-29 DIAGNOSIS — M999 Biomechanical lesion, unspecified: Secondary | ICD-10-CM

## 2015-01-29 MED ORDER — KETOROLAC TROMETHAMINE 60 MG/2ML IM SOLN
60.0000 mg | Freq: Once | INTRAMUSCULAR | Status: AC
Start: 2015-01-29 — End: 2015-01-29
  Administered 2015-01-29: 60 mg via INTRAMUSCULAR

## 2015-01-29 NOTE — Assessment & Plan Note (Signed)
Patient is having significant more pain due to this rib slipping out of place. Patient's will try to use the medications that she has as well as given some topical anti-inflammatories a try. We discussed icing regimen and home exercises. Patient was given an injection today to see if we can decrease any information as well. I believe the patient will do well and we'll continue with formal physical therapy. Patient come back and see me again in 2 weeks to make sure that she is improving
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
## 2015-01-29 NOTE — Patient Instructions (Signed)
Good to see you Get someone else to iron.  Try the Toradol injection today.  Continue the medicines and the vitamins Keep the appointment next week but if doing better move it 2 weeks away.

## 2015-01-29 NOTE — Progress Notes (Signed)
Pre visit review using our clinic review tool, if applicable. No additional management support is needed unless otherwise documented below in the visit note. 

## 2015-01-29 NOTE — Assessment & Plan Note (Signed)
Decision today to treat with OMT was based on Physical Exam  After verbal consent patient was treated with muscle energy techniques in thoracic, cervical and rib areas  Patient tolerated the procedure well with improvement in symptoms  Patient given exercises, stretches and lifestyle modifications  See medications in patient instructions if given  Patient will follow up in 3-4 weeks

## 2015-02-05 ENCOUNTER — Ambulatory Visit: Payer: Medicare Other | Admitting: Family Medicine

## 2015-02-11 ENCOUNTER — Ambulatory Visit (INDEPENDENT_AMBULATORY_CARE_PROVIDER_SITE_OTHER): Payer: Medicare Other | Admitting: Otolaryngology

## 2015-02-11 ENCOUNTER — Ambulatory Visit: Payer: Medicare Other | Admitting: Internal Medicine

## 2015-02-12 ENCOUNTER — Ambulatory Visit: Payer: Medicare Other | Admitting: Family Medicine

## 2015-02-16 ENCOUNTER — Ambulatory Visit: Payer: Medicare Other | Admitting: Family Medicine

## 2015-02-16 ENCOUNTER — Encounter: Payer: Self-pay | Admitting: Internal Medicine

## 2015-02-16 ENCOUNTER — Ambulatory Visit (INDEPENDENT_AMBULATORY_CARE_PROVIDER_SITE_OTHER): Payer: Medicare Other | Admitting: Internal Medicine

## 2015-02-16 ENCOUNTER — Other Ambulatory Visit (INDEPENDENT_AMBULATORY_CARE_PROVIDER_SITE_OTHER): Payer: Medicare Other

## 2015-02-16 VITALS — BP 124/88 | HR 74 | Temp 98.7°F | Resp 18 | Ht 65.0 in | Wt 185.6 lb

## 2015-02-16 DIAGNOSIS — Z0000 Encounter for general adult medical examination without abnormal findings: Secondary | ICD-10-CM | POA: Diagnosis not present

## 2015-02-16 DIAGNOSIS — R413 Other amnesia: Secondary | ICD-10-CM

## 2015-02-16 DIAGNOSIS — M533 Sacrococcygeal disorders, not elsewhere classified: Secondary | ICD-10-CM

## 2015-02-16 DIAGNOSIS — I10 Essential (primary) hypertension: Secondary | ICD-10-CM

## 2015-02-16 DIAGNOSIS — M545 Low back pain: Secondary | ICD-10-CM

## 2015-02-16 LAB — CBC WITH DIFFERENTIAL/PLATELET
BASOS ABS: 0 10*3/uL (ref 0.0–0.1)
Basophils Relative: 0.5 % (ref 0.0–3.0)
EOS ABS: 0.1 10*3/uL (ref 0.0–0.7)
Eosinophils Relative: 1.2 % (ref 0.0–5.0)
HEMATOCRIT: 39.3 % (ref 36.0–46.0)
HEMOGLOBIN: 13.6 g/dL (ref 12.0–15.0)
LYMPHS ABS: 2.6 10*3/uL (ref 0.7–4.0)
Lymphocytes Relative: 36.9 % (ref 12.0–46.0)
MCHC: 34.5 g/dL (ref 30.0–36.0)
MCV: 89 fl (ref 78.0–100.0)
MONOS PCT: 6.4 % (ref 3.0–12.0)
Monocytes Absolute: 0.5 10*3/uL (ref 0.1–1.0)
NEUTROS ABS: 3.9 10*3/uL (ref 1.4–7.7)
Neutrophils Relative %: 55 % (ref 43.0–77.0)
Platelets: 239 10*3/uL (ref 150.0–400.0)
RBC: 4.42 Mil/uL (ref 3.87–5.11)
RDW: 12.8 % (ref 11.5–15.5)
WBC: 7.1 10*3/uL (ref 4.0–10.5)

## 2015-02-16 LAB — BASIC METABOLIC PANEL
BUN: 12 mg/dL (ref 6–23)
CO2: 33 meq/L — AB (ref 19–32)
CREATININE: 0.86 mg/dL (ref 0.40–1.20)
Calcium: 9.8 mg/dL (ref 8.4–10.5)
Chloride: 102 mEq/L (ref 96–112)
GFR: 87.72 mL/min (ref >60.00)
GLUCOSE: 92 mg/dL (ref 70–99)
POTASSIUM: 4.1 meq/L (ref 3.5–5.1)
Sodium: 139 mEq/L (ref 135–145)

## 2015-02-16 LAB — LIPID PANEL
CHOL/HDL RATIO: 3
Cholesterol: 246 mg/dL — ABNORMAL HIGH (ref 0–200)
HDL: 72.3 mg/dL (ref >39.00)
LDL CALC: 160 mg/dL — AB (ref 0–99)
NONHDL: 173.7
Triglycerides: 67 mg/dL (ref 0.0–149.0)
VLDL: 13.4 mg/dL (ref 0.0–40.0)

## 2015-02-16 LAB — TSH: TSH: 1.13 u[IU]/mL (ref 0.35–4.50)

## 2015-02-16 LAB — URINALYSIS, ROUTINE W REFLEX MICROSCOPIC
Bilirubin Urine: NEGATIVE
HGB URINE DIPSTICK: NEGATIVE
Ketones, ur: NEGATIVE
LEUKOCYTES UA: NEGATIVE
NITRITE: NEGATIVE
Specific Gravity, Urine: 1.015 (ref 1.000–1.030)
Total Protein, Urine: NEGATIVE
Urine Glucose: NEGATIVE
Urobilinogen, UA: 0.2 (ref 0.0–1.0)
pH: 7 (ref 5.0–8.0)

## 2015-02-16 LAB — HEPATIC FUNCTION PANEL
ALBUMIN: 4.3 g/dL (ref 3.5–5.2)
ALT: 13 U/L (ref 0–35)
AST: 16 U/L (ref 0–37)
Alkaline Phosphatase: 66 U/L (ref 39–117)
BILIRUBIN DIRECT: 0.1 mg/dL (ref 0.0–0.3)
Total Bilirubin: 0.6 mg/dL (ref 0.2–1.2)
Total Protein: 7.8 g/dL (ref 6.0–8.3)

## 2015-02-16 LAB — VITAMIN B12: Vitamin B-12: 541 pg/mL (ref 211–911)
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
## 2015-02-16 NOTE — Patient Instructions (Signed)
Please continue all other medications as before, and refills have been done if requested.  Please have the pharmacy call with any other refills you may need.  Please continue your efforts at being more active, low cholesterol diet, and weight control.  You are otherwise up to date with prevention measures today.  Please keep your appointments with your specialists as you may have planned  You will be contacted regarding the referral for: Head MRI, and Neurology  Please go to the LAB in the Basement (turn left off the elevator) for the tests to be done today  You will be contacted by phone if any changes need to be made immediately.  Otherwise, you will receive a letter about your results with an explanation, but please check with MyChart first.  Please remember to sign up for MyChart if you have not done so, as this will be important to you in the future with finding out test results, communicating by private email, and scheduling acute appointments online when needed.  Please return in 1 year for your yearly visit, or sooner if needed, with Lab testing done 3-5 days before

## 2015-02-16 NOTE — Assessment & Plan Note (Signed)
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
## 2015-02-16 NOTE — Progress Notes (Signed)
Pre visit review using our clinic review tool, if applicable. No additional management support is needed unless otherwise documented below in the visit note. 

## 2015-03-09 ENCOUNTER — Ambulatory Visit: Payer: Medicare Other | Admitting: Family Medicine

## 2015-03-11 ENCOUNTER — Encounter: Payer: Self-pay | Admitting: Family Medicine

## 2015-03-11 ENCOUNTER — Ambulatory Visit (INDEPENDENT_AMBULATORY_CARE_PROVIDER_SITE_OTHER): Payer: Medicare Other | Admitting: Family Medicine

## 2015-03-11 VITALS — BP 142/82 | HR 72 | Ht 65.0 in | Wt 186.0 lb

## 2015-03-11 DIAGNOSIS — M545 Low back pain, unspecified: Secondary | ICD-10-CM

## 2015-03-11 DIAGNOSIS — M9903 Segmental and somatic dysfunction of lumbar region: Secondary | ICD-10-CM

## 2015-03-11 DIAGNOSIS — M999 Biomechanical lesion, unspecified: Secondary | ICD-10-CM

## 2015-03-11 DIAGNOSIS — M9901 Segmental and somatic dysfunction of cervical region: Secondary | ICD-10-CM | POA: Diagnosis not present

## 2015-03-11 DIAGNOSIS — G8929 Other chronic pain: Secondary | ICD-10-CM

## 2015-03-11 DIAGNOSIS — M9902 Segmental and somatic dysfunction of thoracic region: Secondary | ICD-10-CM | POA: Diagnosis not present

## 2015-03-11 DIAGNOSIS — M9904 Segmental and somatic dysfunction of sacral region: Secondary | ICD-10-CM

## 2015-03-11 NOTE — Assessment & Plan Note (Signed)
Decision today to treat with OMT was based on Physical Exam  After verbal consent patient was treated with muscle energy techniques in thoracic, cervical and rib as well as minorly in the lumbar and sacral areas  Patient tolerated the procedure well with improvement in symptoms  Patient given exercises, stretches and lifestyle modifications  See medications in patient instructions if given  Patient will follow up in 4-5 weeks
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
## 2015-03-11 NOTE — Assessment & Plan Note (Signed)
Patient continues to go to a pain clinic. I think that this chronic pain will always be a problem. We discussed the possibility of changing some of the medications and she will discuss with her primary care provider. Patient was given new exercises to try to unload the piriformis muscle as well as work on strengthening her hip abductors. We discussed icing regimen and home exercises. Patient will do these on a regular basis and continue the natural vitamins. Patient come back and see me again in 4 weeks

## 2015-03-11 NOTE — Progress Notes (Signed)
Pre visit review using our clinic review tool, if applicable. No additional management support is needed unless otherwise documented below in the visit note. 

## 2015-03-11 NOTE — Patient Instructions (Signed)
You are doing great with the posture exercises Try tennis ball in back right pocket with sitting long amount time Exercises on wall.  Heel and butt touching.  Raise leg 6 inches and hold 2 seconds.  Down slow for count of 4 seconds.  1 set of 30 reps daily on both sides.  Continue the vitamins and the other exercises I will see you again in 4-5 weeks.

## 2015-03-22 ENCOUNTER — Telehealth: Payer: Self-pay | Admitting: Neurology

## 2015-03-22 NOTE — Telephone Encounter (Signed)
error 

## 2015-03-25 ENCOUNTER — Ambulatory Visit (INDEPENDENT_AMBULATORY_CARE_PROVIDER_SITE_OTHER): Payer: Medicare Other | Admitting: Neurology

## 2015-03-25 ENCOUNTER — Encounter: Payer: Self-pay | Admitting: Neurology

## 2015-03-25 VITALS — BP 112/74 | HR 77 | Resp 16 | Ht 64.0 in | Wt 184.0 lb

## 2015-03-25 DIAGNOSIS — R413 Other amnesia: Secondary | ICD-10-CM | POA: Diagnosis not present

## 2015-03-25 DIAGNOSIS — G894 Chronic pain syndrome: Secondary | ICD-10-CM | POA: Diagnosis not present

## 2015-03-25 DIAGNOSIS — I10 Essential (primary) hypertension: Secondary | ICD-10-CM
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
## 2015-03-25 NOTE — Patient Instructions (Signed)
1. Schedule head CT without contrast 2. Neuropsychological evaulation at [facility] 3. Follow-up after neuropsych testing

## 2015-03-29 ENCOUNTER — Ambulatory Visit
Admission: RE | Admit: 2015-03-29 | Discharge: 2015-03-29 | Disposition: A | Discharge status: Home / Self Care | Payer: Medicare Other | Source: Ambulatory Visit | Attending: Neurology | Admitting: Neurology

## 2015-03-29 ENCOUNTER — Telehealth: Payer: Self-pay | Admitting: Family Medicine

## 2015-03-29 ENCOUNTER — Other Ambulatory Visit: Payer: Medicare Other

## 2015-03-29 NOTE — Telephone Encounter (Signed)
-----   Message from Van ClinesKaren M Aquino, MD sent at 03/29/2015  9:23 AM EDT ----- Approved, Spoke to Eunice BlaseDebbie, this is valid for 30 days:  [id number]  Thanks KA   ----- Message -----    From: Griffin DakinSherri R Sands    Sent: 03/29/2015   9:06 AM      To: Franciso Bendiffany M Jerrine Urschel, CMA, Van ClinesKaren M Aquino, MD  Where we able to get prior auth for this study. It is scheduled for this afternoon and needed peer to peer. Please let me know. Thanks!! Sherri  ----- Message -----    From: Griffin DakinSherri R Sands    Sent: 03/26/2015   9:07 AM      To: Franciso Bendiffany M Nazaria Riesen, CMA, Van ClinesKaren M Aquino, MD  CT w/o contrast requires peer to peer review, please call [employer] at [phone] select option for peer to peer.   Patient's BCBS # [phone], group # [id number]  We were not given a case number.   Patient's CT scan is scheduled for Monday 03/29/15 @ 140pm @[facility]   Thanks! Sherri

## 2015-04-01 ENCOUNTER — Telehealth: Payer: Self-pay | Admitting: Family Medicine

## 2015-04-01 NOTE — Telephone Encounter (Signed)
-----   Message from Van ClinesKaren M Aquino, MD sent at 03/29/2015  2:17 PM EDT ----- pls let her know I reviewed head CT and it is normal, no evidence of tumor, stroke, or bleed. thanks

## 2015-04-01 NOTE — Telephone Encounter (Signed)
Patient was notified.

## 2015-04-08 ENCOUNTER — Encounter: Payer: Self-pay | Admitting: Family Medicine

## 2015-04-08 ENCOUNTER — Ambulatory Visit (INDEPENDENT_AMBULATORY_CARE_PROVIDER_SITE_OTHER): Payer: Medicare Other | Admitting: Family Medicine

## 2015-04-08 VITALS — BP 132/86 | HR 78 | Ht 64.0 in | Wt 184.0 lb

## 2015-04-08 DIAGNOSIS — M9904 Segmental and somatic dysfunction of sacral region: Secondary | ICD-10-CM | POA: Diagnosis not present

## 2015-04-08 DIAGNOSIS — G894 Chronic pain syndrome: Secondary | ICD-10-CM

## 2015-04-08 DIAGNOSIS — M9902 Segmental and somatic dysfunction of thoracic region: Secondary | ICD-10-CM

## 2015-04-08 DIAGNOSIS — M9903 Segmental and somatic dysfunction of lumbar region: Secondary | ICD-10-CM

## 2015-04-08 DIAGNOSIS — M9901 Segmental and somatic dysfunction of cervical region: Secondary | ICD-10-CM | POA: Diagnosis not present

## 2015-04-08 DIAGNOSIS — M999 Biomechanical lesion, unspecified: Secondary | ICD-10-CM
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
## 2015-04-08 NOTE — Assessment & Plan Note (Signed)
Patient does have what appears to be more of a chronic pain syndrome with the underlying fibromyalgia that is likely causing this. Patient has been doing very well with conservative therapy and is only having somewhat of a flare. We discussed icing regimen and home exercises. We discussed potentially making some diet changes that could be beneficial. Because patient is having some more increasing pain we can follow-up with her sooner than later and have her come back in 3 weeks.

## 2015-04-08 NOTE — Assessment & Plan Note (Signed)
Decision today to treat with OMT was based on Physical Exam  After verbal consent patient was treated with muscle energy techniques in thoracic, cervical and rib as well as minorly in the lumbar and sacral areas  Patient tolerated the procedure well with improvement in symptoms  Patient given exercises, stretches and lifestyle modifications  See medications in patient instructions if given  Patient will follow up in 3 weeks

## 2015-04-08 NOTE — Patient Instructions (Signed)
Good to see you I am sorry you are hurting Lets do duexis 3 times daily for 3 days Ice/Heat is ok when yo uneed it Take weekend off and start again on Monday See me again in 2 weeks.

## 2015-04-08 NOTE — Progress Notes (Signed)
Pre visit review using our clinic review tool, if applicable. No additional management support is needed unless otherwise documented below in the visit note. 

## 2015-04-12 ENCOUNTER — Telehealth: Payer: Self-pay | Admitting: *Deleted

## 2015-04-12 NOTE — Telephone Encounter (Signed)
Patient would like to possible reschedule her [facility] appointment or get something closer she has issues getting there please advise  C/b [phone]

## 2015-04-12 NOTE — Telephone Encounter (Signed)
Lmovm to return my call. 

## 2015-04-14 NOTE — Telephone Encounter (Signed)
Lmovm to return my call. 

## 2015-04-17 ENCOUNTER — Other Ambulatory Visit: Payer: Self-pay | Admitting: Internal Medicine

## 2015-04-22 ENCOUNTER — Ambulatory Visit (INDEPENDENT_AMBULATORY_CARE_PROVIDER_SITE_OTHER): Payer: Medicare Other | Admitting: Family Medicine

## 2015-04-22 ENCOUNTER — Encounter: Payer: Self-pay | Admitting: Family Medicine

## 2015-04-22 ENCOUNTER — Ambulatory Visit: Payer: Medicare Other | Admitting: Family Medicine

## 2015-04-22 VITALS — HR 69 | Ht 64.0 in | Wt 184.0 lb

## 2015-04-22 DIAGNOSIS — M9903 Segmental and somatic dysfunction of lumbar region: Secondary | ICD-10-CM | POA: Diagnosis not present

## 2015-04-22 DIAGNOSIS — M9904 Segmental and somatic dysfunction of sacral region: Secondary | ICD-10-CM

## 2015-04-22 DIAGNOSIS — M9902 Segmental and somatic dysfunction of thoracic region: Secondary | ICD-10-CM

## 2015-04-22 DIAGNOSIS — M9901 Segmental and somatic dysfunction of cervical region: Secondary | ICD-10-CM | POA: Diagnosis not present

## 2015-04-22 DIAGNOSIS — M999 Biomechanical lesion, unspecified: Secondary | ICD-10-CM

## 2015-04-22 NOTE — Patient Instructions (Addendum)
Good to see you as always We are making improvement! Continue the exercise and work on posture a little more.  Ice or heat can help at this point Continue the vitamins.  With the weather we will get better See you again in 3-4 weeks.

## 2015-04-22 NOTE — Assessment & Plan Note (Signed)
Decision today to treat with OMT was based on Physical Exam  After verbal consent patient was treated with muscle energy techniques in thoracic, cervical and rib as well as minorly in the lumbar and sacral areas  Patient tolerated the procedure well with improvement in symptoms  Patient given exercises, stretches and lifestyle modifications  See medications in patient instructions if given  Patient will follow up in 3-4 weeks
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
## 2015-04-22 NOTE — Progress Notes (Signed)
Pre visit review using our clinic review tool, if applicable. No additional management support is needed unless otherwise documented below in the visit note. 

## 2015-04-22 NOTE — Assessment & Plan Note (Signed)
She does have fibromyalgia that I think is mostly the underlying condition. We discussed the possibility of Singulair which can be used as an adjunct treatment options and patient was to avoid any other perception medications at this time. Encourage patient to continue home exercises, icing protocol, as well as continuing to monitor diet and exercise. Patient will come back and see me again in 3 weeks for further evaluation and treatment. Continues to respond well to osteopathic manipulation.

## 2015-05-03 ENCOUNTER — Telehealth: Payer: Self-pay | Admitting: Family Medicine

## 2015-05-03 NOTE — Telephone Encounter (Signed)
Patient is requesting to be seen possibly tomorrow. Her displaced rib is painful and she is afraid to go to an urgent care and risk nerve damage because of her unique condition.

## 2015-05-03 NOTE — Telephone Encounter (Signed)
Dr. Katrinka BlazingSmith had put patients rib back in place last week and the rib has come back out again. Since he is so booked up, would an urgent care visit be the best for her?

## 2015-05-03 NOTE — Telephone Encounter (Signed)
Spoke with patient and scheduled to be seen Thursday at 10:30am. Told to call back if issues arise.

## 2015-05-03 NOTE — Telephone Encounter (Signed)
I am ok wed or Thursday to see her.

## 2015-05-06 ENCOUNTER — Ambulatory Visit (INDEPENDENT_AMBULATORY_CARE_PROVIDER_SITE_OTHER): Payer: Medicare Other | Admitting: Family Medicine

## 2015-05-06 ENCOUNTER — Encounter: Payer: Self-pay | Admitting: Family Medicine

## 2015-05-06 VITALS — BP 126/78 | HR 82 | Ht 64.0 in | Wt 182.0 lb

## 2015-05-06 DIAGNOSIS — M9902 Segmental and somatic dysfunction of thoracic region: Secondary | ICD-10-CM

## 2015-05-06 DIAGNOSIS — M940 Chondrocostal junction syndrome [Tietze]: Secondary | ICD-10-CM | POA: Diagnosis not present

## 2015-05-06 DIAGNOSIS — M9904 Segmental and somatic dysfunction of sacral region: Secondary | ICD-10-CM

## 2015-05-06 DIAGNOSIS — M9903 Segmental and somatic dysfunction of lumbar region: Secondary | ICD-10-CM | POA: Diagnosis not present

## 2015-05-06 DIAGNOSIS — M9901 Segmental and somatic dysfunction of cervical region: Secondary | ICD-10-CM

## 2015-05-06 DIAGNOSIS — M999 Biomechanical lesion, unspecified: Secondary | ICD-10-CM

## 2015-05-06 MED ORDER — BACLOFEN 10 MG PO TABS: 10.0000 mg | ORAL_TABLET | Freq: Three times a day (TID) | ORAL | Status: DC
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
## 2015-05-06 NOTE — Assessment & Plan Note (Signed)
Decision today to treat with OMT was based on Physical Exam  After verbal consent patient was treated with muscle energy techniques in thoracic, cervical and rib as well as minorly in the lumbar and sacral areas  Patient tolerated the procedure well with improvement in symptoms  Patient given exercises, stretches and lifestyle modifications  See medications in patient instructions if given  Patient will follow up in 2-3 weeks

## 2015-05-06 NOTE — Progress Notes (Signed)
Pre visit review using our clinic review tool, if applicable. No additional management support is needed unless otherwise documented below in the visit note. 

## 2015-05-06 NOTE — Assessment & Plan Note (Signed)
Discussed scapular stabilization and icing and continue home exercises.  RTC in 3 weeks.

## 2015-05-06 NOTE — Patient Instructions (Signed)
Good to see you Ice is your friend Baclofen if needed Keep the appointment and continue everything you are doing.

## 2015-05-07 ENCOUNTER — Telehealth: Payer: Self-pay | Admitting: Internal Medicine

## 2015-05-07 NOTE — Telephone Encounter (Signed)
Pt called in and said that her rib has popped back out again and needs to know what she should do now?  Best number [phone]

## 2015-05-10 NOTE — Telephone Encounter (Signed)
Spoke with pt, she states she's starting to feel a littler better. Advised pt to give it a few more days & if not any better to let us know. Pt understood.

## 2015-05-10 NOTE — Telephone Encounter (Signed)
Tell her out of office for the weekend call and check in on her.

## 2015-05-20 ENCOUNTER — Encounter: Payer: Self-pay | Admitting: Family Medicine

## 2015-05-20 ENCOUNTER — Encounter: Payer: Self-pay | Admitting: Internal Medicine

## 2015-05-20 ENCOUNTER — Ambulatory Visit (INDEPENDENT_AMBULATORY_CARE_PROVIDER_SITE_OTHER): Payer: Medicare Other | Admitting: Family Medicine

## 2015-05-20 ENCOUNTER — Ambulatory Visit (INDEPENDENT_AMBULATORY_CARE_PROVIDER_SITE_OTHER): Payer: Medicare Other | Admitting: Internal Medicine

## 2015-05-20 VITALS — BP 130/90 | HR 62 | Temp 98.2°F | Ht 64.0 in | Wt 187.0 lb

## 2015-05-20 VITALS — BP 122/72 | HR 72 | Ht 64.0 in | Wt 186.0 lb

## 2015-05-20 DIAGNOSIS — M5489 Other dorsalgia: Secondary | ICD-10-CM

## 2015-05-20 DIAGNOSIS — F329 Major depressive disorder, single episode, unspecified: Secondary | ICD-10-CM | POA: Diagnosis not present

## 2015-05-20 DIAGNOSIS — M9904 Segmental and somatic dysfunction of sacral region: Secondary | ICD-10-CM | POA: Diagnosis not present

## 2015-05-20 DIAGNOSIS — M797 Fibromyalgia: Secondary | ICD-10-CM | POA: Diagnosis not present

## 2015-05-20 DIAGNOSIS — M9901 Segmental and somatic dysfunction of cervical region: Secondary | ICD-10-CM | POA: Diagnosis not present

## 2015-05-20 DIAGNOSIS — M9903 Segmental and somatic dysfunction of lumbar region: Secondary | ICD-10-CM | POA: Diagnosis not present

## 2015-05-20 DIAGNOSIS — I10 Essential (primary) hypertension: Secondary | ICD-10-CM

## 2015-05-20 DIAGNOSIS — F32A Depression, unspecified: Secondary | ICD-10-CM

## 2015-05-20 DIAGNOSIS — M999 Biomechanical lesion, unspecified: Secondary | ICD-10-CM

## 2015-05-20 DIAGNOSIS — M940 Chondrocostal junction syndrome [Tietze]: Secondary | ICD-10-CM | POA: Diagnosis not present

## 2015-05-20 MED ORDER — ESCITALOPRAM OXALATE 10 MG PO TABS: 10.0000 mg | ORAL_TABLET | Freq: Every day | ORAL | Status: DC

## 2015-05-20 NOTE — Assessment & Plan Note (Signed)
Discussed icing HEP and scapular stabilization again.  Discussed lifting mechanic RTC in 3-4 weeks.

## 2015-05-20 NOTE — Progress Notes (Signed)
Subjective:    Patient ID: Catherine Gardner, female    DOB: 1961-06-30, 54 y.o.   MRN: [id number]  HPI  Here with c/o FMS flare it seems with more pain, low mood, sad and uncontrolled pain, asks for pain management referral. No SI or HI.  Has tried elavil, cymbalta, lyrica, savella, nsaids, and muscle relaxers.  Wants to wait on SSRI due to potential for wt gain.  Pt denies chest pain, increased sob or doe, wheezing, orthopnea, PND, increased LE swelling, palpitations, dizziness or syncope.  Pt denies new neurological symptoms such as new headache, or facial or extremity weakness or numbness   Pt denies polydipsia, polyuria,  Pt denies fever, wt loss, night sweats, loss of appetite, or other constitutional symptoms  Does c/o ongoing fatigue, but denies signficant daytime hypersomnolence. Pt continues to have recurring LBP without change in severity, bowel or bladder change, fever, wt loss,  worsening LE pain/numbness/weakness, gait change or falls. Past Medical History  Diagnosis Date  . SHINGLES 11/09/2009  . VITAMIN D DEFICIENCY 07/28/2009  . HYPERLIPIDEMIA 10/31/2007  . 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 DEPRESSION 10/31/2007  . HYPERTENSION 10/31/2007  . SINUSITIS- ACUTE-NOS 12/20/2009  . ARTHRITIS 10/31/2007  . POLYARTHRALGIA 10/31/2007  . LOW BACK PAIN 10/31/2007  . FIBROMYALGIA 10/31/2007  . OSTEOPENIA 10/31/2007  . INSOMNIA 06/02/2010  . RASH-NONVESICULAR 06/02/2010  . Headache(784.0) 07/12/2009  . ANXIETY 12/27/2010  . Chronic lower back pain 10/28/2013  . Arachnoiditis    Past Surgical History  Procedure Laterality Date  . Abdominal hysterectomy    . S/p diskectomy  2004    L5  . Lumbar fusion  2008  . S/p right foot morton's neuroma  Jan. 2011  . Implant      neuro spine stimulator    reports that she has never smoked. She has never used smokeless tobacco. She reports that she drinks alcohol. She reports that she does not use illicit drugs. family history includes ADD / ADHD in her  daughter; Alcohol abuse in her other; Crohn's disease in her other; Depression in her daughter; GER disease in her mother; Hyperlipidemia in her other; Leukemia in her father; Ovarian cancer in her maternal aunt; Throat cancer in her paternal uncle. Allergies  Allergen Reactions  . Etodolac Other (See Comments)    Chest pain  . Meperidine Hcl Hives       . Morphine Hives  . Penicillins Hives       . Effexor [Venlafaxine]     incr anxiety  . Imitrex [Sumatriptan Base]   . Benazepril Other (See Comments)    Hair falls out  . Nickel Rash       . Simvastatin Other (See Comments)    Join pain and itching   Current Outpatient Prescriptions on File Prior to Visit  Medication Sig Dispense Refill  . baclofen (LIORESAL) 10 MG tablet Take 1 tablet (10 mg total) by mouth 3 (three) times daily. 30 each 0  . calcium-vitamin D (OSCAL WITH D) 500-200 MG-UNIT per tablet Take 1 tablet by mouth daily.      Marland Kitchen estradiol (ESTRACE) 1 MG tablet Take 1 tablet (1 mg total) by mouth daily. 90 tablet 3  . hydrochlorothiazide (MICROZIDE) 12.5 MG capsule Take 1 capsule (12.5 mg total) by mouth daily. 90 capsule 1  . labetalol (NORMODYNE) 200 MG tablet TAKE 1 TABLET (200 MG TOTAL) BY MOUTH 2 (TWO) TIMES DAILY. 180 tablet 3  . zolpidem (AMBIEN) 10 MG tablet TAKE 1 TABLET BY MOUTH  AT BEDTIME AS NEEDED FOR SLEEP 30 tablet 5   No current facility-administered medications on file prior to visit.   Review of Systems  Constitutional: Negative for unusual diaphoresis or night sweats HENT: Negative for ringing in ear or discharge Eyes: Negative for double vision or worsening visual disturbance.  Respiratory: Negative for choking and stridor.   Gastrointestinal: Negative for vomiting or other signifcant bowel change Genitourinary: Negative for hematuria or change in urine volume.  Musculoskeletal: Negative for other MSK pain or swelling Skin: Negative for color change and worsening wound.  Neurological: Negative for  tremors and numbness other than noted  Psychiatric/Behavioral: Negative for decreased concentration or agitation other than above       Objective:   Physical Exam BP 130/90 mmHg  Pulse 62  Temp(Src) 98.2 F (36.8 C) (Oral)  Ht  (1.626 m)  Wt 187 lb (84.823 kg)  BMI 32.08 kg/m2  SpO2 97% VS noted,  Constitutional: Pt appears in no significant distress HENT: Head: NCAT.  Right Ear: External ear normal.  Left Ear: External ear normal.  Eyes: . Pupils are equal, round, and reactive to light. Conjunctivae and EOM are normal Neck: Normal range of motion. Neck supple.  Cardiovascular: Normal rate and regular rhythm.   Pulmonary/Chest: Effort normal and breath sounds without rales or wheezing.  Abd:  Soft, NT, ND, + BS Neurological: Pt is alert. Not confused , motor grossly intact Skin: Skin is warm. No rash, no LE edema Spine nontender Psychiatric: Pt behavior is normal. No agitation. 1+ nervous, mild depressed affect    Assessment & Plan:

## 2015-05-20 NOTE — Assessment & Plan Note (Signed)
Decision today to treat with OMT was based on Physical Exam  After verbal consent patient was treated with muscle energy techniques in thoracic, cervical and rib as well as minorly in the lumbar and sacral areas  Patient tolerated the procedure well with improvement in symptoms  Patient given exercises, stretches and lifestyle modifications  See medications in patient instructions if given  Patient will follow up in 3-4 weeks

## 2015-05-20 NOTE — Progress Notes (Signed)
Pre visit review using our clinic review tool, if applicable. No additional management support is needed unless otherwise documented below in the visit note. 

## 2015-05-20 NOTE — Patient Instructions (Signed)
Good to see you Ice is your friend Use the muscle relaxer at night if you need it Overall doing great  Lets space you out to 3-4 weeks.
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
## 2015-05-20 NOTE — Patient Instructions (Signed)
Please take all new medication as prescribed - the lexapro 10 mg per day  Please continue all other medications as before, and refills have been done if requested.  Please have the pharmacy call with any other refills you may need.  Please keep your appointments with your specialists as you may have planned  You will be contacted regarding the referral for: Pain Management

## 2015-05-22 NOTE — Assessment & Plan Note (Signed)
Tried mult meds, ok for pain clinic referral per pt request

## 2015-05-22 NOTE — Assessment & Plan Note (Signed)
stable overall by history and exam, recent data reviewed with pt, and pt to continue medical treatment as before,  to f/u any worsening symptoms or concerns BP Readings from Last 3 Encounters:  05/20/15 130/90  05/20/15 122/72  05/06/15 126/78

## 2015-05-22 NOTE — Assessment & Plan Note (Signed)
No new neuro change, for pain control,  to f/u any worsening symptoms or concerns

## 2015-05-22 NOTE — Assessment & Plan Note (Signed)
Mild to mod, for lexapro 10 qd,  to f/u any worsening symptoms or concerns, declines counseling referral

## 2015-06-02 ENCOUNTER — Other Ambulatory Visit: Payer: Self-pay | Admitting: Orthopedic Surgery

## 2015-06-02 DIAGNOSIS — T84296A Other mechanical complication of internal fixation device of vertebrae, initial encounter: Secondary | ICD-10-CM

## 2015-06-07 ENCOUNTER — Ambulatory Visit
Admission: RE | Admit: 2015-06-07 | Discharge: 2015-06-07 | Disposition: A | Discharge status: Home / Self Care | Payer: Medicare Other | Source: Ambulatory Visit | Attending: Orthopedic Surgery | Admitting: Orthopedic Surgery

## 2015-06-07 DIAGNOSIS — T84296A Other mechanical complication of internal fixation device of vertebrae, initial encounter: Secondary | ICD-10-CM

## 2015-06-11 ENCOUNTER — Ambulatory Visit: Payer: Medicare Other | Admitting: Family Medicine

## 2015-06-24 ENCOUNTER — Other Ambulatory Visit: Payer: Self-pay | Admitting: Internal Medicine

## 2015-07-02 ENCOUNTER — Encounter: Payer: Self-pay | Admitting: Family Medicine

## 2015-07-02 ENCOUNTER — Ambulatory Visit (INDEPENDENT_AMBULATORY_CARE_PROVIDER_SITE_OTHER): Payer: Medicare Other | Admitting: Family Medicine

## 2015-07-02 VITALS — HR 80 | Ht 64.0 in | Wt 183.0 lb

## 2015-07-02 DIAGNOSIS — G894 Chronic pain syndrome: Secondary | ICD-10-CM | POA: Diagnosis not present

## 2015-07-02 DIAGNOSIS — M9902 Segmental and somatic dysfunction of thoracic region: Secondary | ICD-10-CM

## 2015-07-02 DIAGNOSIS — M9904 Segmental and somatic dysfunction of sacral region: Secondary | ICD-10-CM | POA: Diagnosis not present

## 2015-07-02 DIAGNOSIS — M9901 Segmental and somatic dysfunction of cervical region: Secondary | ICD-10-CM | POA: Diagnosis not present

## 2015-07-02 DIAGNOSIS — M999 Biomechanical lesion, unspecified: Secondary | ICD-10-CM

## 2015-07-02 DIAGNOSIS — M9903 Segmental and somatic dysfunction of lumbar region: Secondary | ICD-10-CM

## 2015-07-02 NOTE — Progress Notes (Signed)
Pre visit review using our clinic review tool, if applicable. No additional management support is needed unless otherwise documented below in the visit note. 
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
## 2015-07-02 NOTE — Patient Instructions (Addendum)
Good to see you Try to do the exercises more regularly.  For the elbow compression sleeve and pillow underneath anytime when resting for next 2 weeks Turmeric  twice daily Fish oil 2 grams daily Tart cherry extract nightly.  See me again in 4-6 weeks.

## 2015-07-02 NOTE — Assessment & Plan Note (Signed)
Patient continues to have chronic pain syndrome. Patient continues to have difficulty with a neurologic complaint. Patient is going to see other specialists social therapy beneficial. It seems that osteopathic manipulation has been somewhat helpful though. We discussed the possibility of trigger point injections which patient declined. We discussed also the possibility of using Singulair as an adjunct treatment which patient has declined. Patient will try some over-the-counter natural supplementations. Encourage patient to do the home exercises on a more regular basis. Patient come back and see me again in 4-6 weeks for further evaluation and treatment.

## 2015-07-02 NOTE — Assessment & Plan Note (Signed)
Decision today to treat with OMT was based on Physical Exam  After verbal consent patient was treated with muscle energy techniques in thoracic, cervical and rib as well as minorly in the lumbar and sacral areas  Patient tolerated the procedure well with improvement in symptoms  Patient given exercises, stretches and lifestyle modifications  See medications in patient instructions if given  Patient will follow up in 4-6 weeks

## 2015-07-20 ENCOUNTER — Other Ambulatory Visit: Payer: Self-pay

## 2015-07-20 MED ORDER — ZOLPIDEM TARTRATE 10 MG PO TABS: 10.0000 mg | ORAL_TABLET | Freq: Every evening | ORAL | Status: DC | PRN

## 2015-07-20 NOTE — Telephone Encounter (Signed)
Done hardcopy to Dahlia  

## 2015-07-21 NOTE — Telephone Encounter (Signed)
Rx faxed to pharmacy  

## 2015-07-26 ENCOUNTER — Encounter (HOSPITAL_COMMUNITY): Payer: Self-pay | Admitting: Emergency Medicine

## 2015-07-26 ENCOUNTER — Emergency Department (HOSPITAL_COMMUNITY)
Admission: EM | Admit: 2015-07-26 | Discharge: 2015-07-26 | Disposition: A | Discharge status: Home / Self Care | Payer: Medicare Other | Attending: Emergency Medicine | Admitting: Emergency Medicine

## 2015-07-26 DIAGNOSIS — G8929 Other chronic pain: Secondary | ICD-10-CM | POA: Insufficient documentation

## 2015-07-26 DIAGNOSIS — Z880 Allergy status to penicillin: Secondary | ICD-10-CM | POA: Diagnosis not present

## 2015-07-26 DIAGNOSIS — M25511 Pain in right shoulder: Secondary | ICD-10-CM | POA: Diagnosis not present

## 2015-07-26 DIAGNOSIS — F329 Major depressive disorder, single episode, unspecified: Secondary | ICD-10-CM | POA: Diagnosis not present

## 2015-07-26 DIAGNOSIS — M8589 Other specified disorders of bone density and structure, multiple sites: Secondary | ICD-10-CM | POA: Diagnosis not present

## 2015-07-26 DIAGNOSIS — Z79899 Other long term (current) drug therapy: Secondary | ICD-10-CM | POA: Diagnosis not present

## 2015-07-26 DIAGNOSIS — M5489 Other dorsalgia: Secondary | ICD-10-CM

## 2015-07-26 DIAGNOSIS — E559 Vitamin D deficiency, unspecified: Secondary | ICD-10-CM | POA: Diagnosis not present

## 2015-07-26 DIAGNOSIS — Z8619 Personal history of other infectious and parasitic diseases: Secondary | ICD-10-CM | POA: Diagnosis not present

## 2015-07-26 DIAGNOSIS — R200 Anesthesia of skin: Secondary | ICD-10-CM | POA: Diagnosis not present

## 2015-07-26 DIAGNOSIS — M797 Fibromyalgia: Secondary | ICD-10-CM | POA: Insufficient documentation

## 2015-07-26 DIAGNOSIS — R202 Paresthesia of skin: Secondary | ICD-10-CM | POA: Diagnosis not present

## 2015-07-26 DIAGNOSIS — G4700 Insomnia, unspecified: Secondary | ICD-10-CM | POA: Insufficient documentation

## 2015-07-26 DIAGNOSIS — F419 Anxiety disorder, unspecified: Secondary | ICD-10-CM | POA: Diagnosis not present

## 2015-07-26 DIAGNOSIS — Z862 Personal history of diseases of the blood and blood-forming organs and certain disorders involving the immune mechanism: Secondary | ICD-10-CM | POA: Diagnosis not present

## 2015-07-26 DIAGNOSIS — M79605 Pain in left leg: Secondary | ICD-10-CM | POA: Diagnosis not present

## 2015-07-26 DIAGNOSIS — M25512 Pain in left shoulder: Secondary | ICD-10-CM | POA: Insufficient documentation

## 2015-07-26 DIAGNOSIS — M545 Low back pain: Secondary | ICD-10-CM | POA: Insufficient documentation

## 2015-07-26 DIAGNOSIS — E785 Hyperlipidemia, unspecified: Secondary | ICD-10-CM | POA: Insufficient documentation

## 2015-07-26 DIAGNOSIS — M79604 Pain in right leg: Secondary | ICD-10-CM | POA: Insufficient documentation

## 2015-07-26 DIAGNOSIS — I10 Essential (primary) hypertension: Secondary | ICD-10-CM | POA: Diagnosis not present

## 2015-07-26 MED ORDER — ONDANSETRON HCL 8 MG PO TABS: 8.0000 mg | ORAL_TABLET | Freq: Three times a day (TID) | ORAL | Status: DC | PRN

## 2015-07-26 MED ORDER — PROMETHAZINE HCL 25 MG PO TABS
25.0000 mg | ORAL_TABLET | Freq: Once | ORAL | Status: AC
  Administered 2015-07-26: 25 mg via ORAL
  Filled 2015-07-26: qty 1

## 2015-07-26 MED ORDER — TRAMADOL HCL 50 MG PO TABS: 50.0000 mg | ORAL_TABLET | Freq: Four times a day (QID) | ORAL | Status: DC | PRN

## 2015-07-26 MED ORDER — TRAMADOL HCL 50 MG PO TABS
50.0000 mg | ORAL_TABLET | Freq: Once | ORAL | Status: AC
  Administered 2015-07-26: 50 mg via ORAL
  Filled 2015-07-26: qty 1

## 2015-07-26 NOTE — Discharge Instructions (Signed)
-   Call your spine doctor and try to move up appointment to as soon as possible - Call your pain clinic and try to move up appointment to as soon as possible - Use ultram as needed for pain - Return to ED with fevers, loss of bowel or bladder control, weakness in your legs or further worsening of your symptoms

## 2015-07-26 NOTE — ED Notes (Signed)
Pt A+ox4, reports c/o spinal back pain, hx chronic back pain, has appt with pain clinic the end of the month "but can't wait that long".  Reports taking aleve with no relief.  Pt denies new injury.  Pt denies new n/t to extremities, reports chronic numbness to R great toe and "my tail bone is always tingly", denies b/b changes or complaints.  Ambulatory with steady gait.  MAEI.  Speaking full/clear sentences, rr even/un-lab.  NAD.

## 2015-07-26 NOTE — ED Provider Notes (Signed)
CSN: [id number]     Arrival date & time 07/26/15  1106 History   First MD Initiated Contact with Patient 07/26/15 1116     Chief Complaint  Patient presents with  . Back Pain    "spine is sore from base of my head to my tail"    HPI  Catherine Gardner is a 54 year old female with PMHx of back pain, anxiety and chronic pain syndrome presenting today with worsening of chronic back pain. Pt reports that over the past few weeks her back pain has increased in severity and she now "feels the pain all the way up and down my spine". Pt has chronic pain over her shoulders, mid-back, lower back and legs.She is also complaining of "tingling over my tailbone" and a numb right great toe. She reports she has had these symptoms for a long time. Her main complaint today is that she is unable to sleep well 2/2 increasing pain. She sees a spine doctor and sports medicine doctor for symptom control. She has an appointment for the end of September to get established in a pain clinic. She has an appointment with her spine doctor on September 9th. Denies bowel or bladder incontinence, numbness or tingling in the groin, weakness of the legs or new symptoms of her chronic pain. Denies recent falls or traumas. Pt still able to ambulate without assistance. Denies headaches, chest pain, SOB, abdominal pain, nausea and vomiting.   Past Medical History  Diagnosis Date  . SHINGLES 11/09/2009  . VITAMIN D DEFICIENCY 07/28/2009  . HYPERLIPIDEMIA 10/31/2007  . 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 DEPRESSION 10/31/2007  . HYPERTENSION 10/31/2007  . SINUSITIS- ACUTE-NOS 12/20/2009  . ARTHRITIS 10/31/2007  . POLYARTHRALGIA 10/31/2007  . LOW BACK PAIN 10/31/2007  . FIBROMYALGIA 10/31/2007  . OSTEOPENIA 10/31/2007  . INSOMNIA 06/02/2010  . RASH-NONVESICULAR 06/02/2010  . Headache(784.0) 07/12/2009  . ANXIETY 12/27/2010  . Chronic lower back pain 10/28/2013  . Arachnoiditis    Past Surgical History  Procedure Laterality Date  . Abdominal  hysterectomy    . S/p diskectomy  2004    L5  . Lumbar fusion  2008  . S/p right foot morton's neuroma  Jan. 2011  . Implant      neuro spine stimulator   Family History  Problem Relation Age of Onset  . ADD / ADHD Daughter   . Depression Daughter   . Hyperlipidemia Other   . Alcohol abuse Other     alcholism/addiction  . Crohn's disease Other   . Leukemia Father   . GER disease Mother   . Ovarian cancer Maternal Aunt   . Throat cancer Paternal Uncle    Social History  Substance Use Topics  . Smoking status: Never Smoker   . Smokeless tobacco: Never Used  . Alcohol Use: 0.0 oz/week    0 Standard drinks or equivalent per week     Comment: occ   OB History    No data available     Review of Systems  Constitutional: Negative for fever.  Respiratory: Negative for shortness of breath.   Cardiovascular: Negative for chest pain.  Gastrointestinal: Negative for nausea, vomiting and abdominal pain.  Musculoskeletal: Positive for myalgias, back pain and arthralgias. Negative for joint swelling, gait problem and neck pain.  Skin: Negative for rash.  Neurological: Positive for numbness. Negative for weakness and headaches.      Allergies  Etodolac; Meperidine hcl; Morphine; Penicillins; Effexor; Imitrex; Benazepril; Nickel; and Simvastatin  Home Medications  Prior to Admission medications   Medication Sig Start Date End Date Taking? Authorizing Provider  baclofen (LIORESAL) 10 MG tablet Take 1 tablet (10 mg total) by mouth 3 (three) times daily. 05/06/15   Judi Saa, DO  calcium-vitamin D (OSCAL WITH D) 500-200 MG-UNIT per tablet Take 1 tablet by mouth daily.      Historical Provider, MD  escitalopram (LEXAPRO) 10 MG tablet Take 1 tablet (10 mg total) by mouth daily. 05/20/15 08/18/15  Corwin Levins, MD  estradiol (ESTRACE) 1 MG tablet TAKE 1 TABLET BY MOUTH EVERY DAY 06/24/15   Corwin Levins, MD  hydrochlorothiazide (MICROZIDE) 12.5 MG capsule Take 1 capsule (12.5 mg  total) by mouth daily. 09/14/14   Corwin Levins, MD  labetalol (NORMODYNE) 200 MG tablet TAKE 1 TABLET (200 MG TOTAL) BY MOUTH 2 (TWO) TIMES DAILY. 04/19/15   Corwin Levins, MD  ondansetron (ZOFRAN) 8 MG tablet Take 1 tablet (8 mg total) by mouth every 8 (eight) hours as needed for nausea or vomiting. 07/26/15   Rolm Gala Kathaleya Mcduffee, PA-C  traMADol (ULTRAM) 50 MG tablet Take 1 tablet (50 mg total) by mouth every 6 (six) hours as needed. 07/26/15   Ronita Hargreaves, PA-C  zolpidem (AMBIEN) 10 MG tablet Take 1 tablet (10 mg total) by mouth at bedtime as needed. for sleep 07/20/15   Corwin Levins, MD   BP 139/79 mmHg  Pulse 74  Temp(Src) 98.5 F (36.9 C) (Oral)  Resp 18  Ht 5\' 4"  (1.626 m)  Wt 187 lb (84.823 kg)  BMI 32.08 kg/m2  SpO2 99% Physical Exam  Constitutional: She is oriented to person, place, and time. She appears well-developed and well-nourished. No distress.  HENT:  Head: Normocephalic and atraumatic.  Neck: Normal range of motion.  Cardiovascular: Normal rate, regular rhythm and normal heart sounds.   Pulmonary/Chest: Effort normal and breath sounds normal. No respiratory distress.  Musculoskeletal: Normal range of motion.  TTP over bilateral shoulders, thoracic back and lumbar back. Pt has full active ROM of BUE and BLE. Walks with a steady gait.   Neurological: She is alert and oriented to person, place, and time. She has normal strength. No cranial nerve deficit or sensory deficit.  Skin: Skin is warm and dry.  No rash noted over back or chest wall  Psychiatric: She has a normal mood and affect. Her behavior is normal.  Vitals reviewed.   ED Course  Procedures (including critical care time) Labs Review Labs Reviewed - No data to display  Imaging Review No results found. I have personally reviewed and evaluated these images and lab results as part of my medical decision-making.   EKG Interpretation None      MDM   Final diagnoses:  Midline back pain, unspecified location    Chronic back pain Pt presenting with acute worsening of chronic back pain. Denies recent falls or trauma. No new symptoms of her chronic pain, only increasing severity of pain. VSS. Nontoxic. TTP over shoulders and back on exam. No focal neurological findings. Pt with full ROM of extremities and back. Pt has future appointments with her spine doctor and a pain clinic. Given ultram and phenergan in ED. Pt states ultram makes her nauseous. Encouraged pt to call her pain clinic to move up her appointment date. She agrees and states that she will. Pt had called her spine doctor while I was out of the room and reports that she moved the appointment up. Will give 5 ultram and  zofran for possible nausea. Pt to follow up for further pain control with either PCP, spine doctor or pain clinic. Pt agrees with this plan. Return precautions given in discharge paperwork.     Rolm Gala Draco Malczewski, PA-C 07/26/15 1920  Lorre Nick, MD 07/30/15 [phone]

## 2015-07-26 NOTE — ED Notes (Signed)
Pt asked that she have po phenegran with her meds that it makes her sick. She states even with eating it does not help.

## 2015-08-03 ENCOUNTER — Encounter: Payer: Self-pay | Admitting: Physical Medicine & Rehabilitation

## 2015-08-06 ENCOUNTER — Encounter: Payer: Self-pay | Admitting: Physical Medicine & Rehabilitation

## 2015-08-12 ENCOUNTER — Ambulatory Visit: Payer: Medicare Other | Admitting: Family Medicine

## 2015-08-13 ENCOUNTER — Other Ambulatory Visit: Payer: Self-pay | Admitting: Internal Medicine

## 2015-08-20 ENCOUNTER — Ambulatory Visit: Payer: Medicare Other | Admitting: Physical Medicine & Rehabilitation

## 2015-09-13 ENCOUNTER — Encounter: Payer: Self-pay | Admitting: Gastroenterology

## 2015-09-21 ENCOUNTER — Encounter: Payer: Self-pay | Admitting: Physical Medicine & Rehabilitation

## 2015-09-21 ENCOUNTER — Ambulatory Visit (INDEPENDENT_AMBULATORY_CARE_PROVIDER_SITE_OTHER): Payer: Medicare Other | Admitting: Internal Medicine

## 2015-09-21 ENCOUNTER — Ambulatory Visit (HOSPITAL_BASED_OUTPATIENT_CLINIC_OR_DEPARTMENT_OTHER): Payer: Medicare Other | Admitting: Physical Medicine & Rehabilitation

## 2015-09-21 ENCOUNTER — Other Ambulatory Visit: Payer: Self-pay | Admitting: Physical Medicine & Rehabilitation

## 2015-09-21 ENCOUNTER — Encounter: Payer: Medicare Other | Attending: Physical Medicine & Rehabilitation

## 2015-09-21 ENCOUNTER — Encounter: Payer: Self-pay | Admitting: Internal Medicine

## 2015-09-21 VITALS — BP 144/103 | HR 62 | Resp 16

## 2015-09-21 VITALS — BP 132/84 | HR 75 | Temp 97.8°F | Resp 16 | Wt 187.0 lb

## 2015-09-21 DIAGNOSIS — B349 Viral infection, unspecified: Secondary | ICD-10-CM

## 2015-09-21 DIAGNOSIS — M961 Postlaminectomy syndrome, not elsewhere classified: Secondary | ICD-10-CM | POA: Diagnosis not present

## 2015-09-21 DIAGNOSIS — Z886 Allergy status to analgesic agent status: Secondary | ICD-10-CM | POA: Diagnosis not present

## 2015-09-21 DIAGNOSIS — R42 Dizziness and giddiness: Secondary | ICD-10-CM | POA: Insufficient documentation

## 2015-09-21 DIAGNOSIS — M2550 Pain in unspecified joint: Secondary | ICD-10-CM | POA: Diagnosis not present

## 2015-09-21 DIAGNOSIS — M5416 Radiculopathy, lumbar region: Secondary | ICD-10-CM

## 2015-09-21 DIAGNOSIS — R51 Headache: Secondary | ICD-10-CM | POA: Diagnosis not present

## 2015-09-21 DIAGNOSIS — R21 Rash and other nonspecific skin eruption: Secondary | ICD-10-CM | POA: Diagnosis not present

## 2015-09-21 DIAGNOSIS — R531 Weakness: Secondary | ICD-10-CM | POA: Insufficient documentation

## 2015-09-21 DIAGNOSIS — G894 Chronic pain syndrome: Secondary | ICD-10-CM | POA: Insufficient documentation

## 2015-09-21 DIAGNOSIS — Z5181 Encounter for therapeutic drug level monitoring: Secondary | ICD-10-CM

## 2015-09-21 DIAGNOSIS — G4700 Insomnia, unspecified: Secondary | ICD-10-CM | POA: Diagnosis not present

## 2015-09-21 DIAGNOSIS — I10 Essential (primary) hypertension: Secondary | ICD-10-CM | POA: Diagnosis not present

## 2015-09-21 DIAGNOSIS — M797 Fibromyalgia: Secondary | ICD-10-CM | POA: Diagnosis not present

## 2015-09-21 DIAGNOSIS — D508 Other iron deficiency anemias: Secondary | ICD-10-CM | POA: Diagnosis not present

## 2015-09-21 DIAGNOSIS — E785 Hyperlipidemia, unspecified: Secondary | ICD-10-CM | POA: Insufficient documentation

## 2015-09-21 DIAGNOSIS — M8580 Other specified disorders of bone density and structure, unspecified site: Secondary | ICD-10-CM | POA: Insufficient documentation

## 2015-09-21 DIAGNOSIS — F418 Other specified anxiety disorders: Secondary | ICD-10-CM | POA: Insufficient documentation

## 2015-09-21 MED ORDER — TRAMADOL HCL ER 100 MG PO TB24: 100.0000 mg | ORAL_TABLET | Freq: Every day | ORAL | Status: DC

## 2015-09-21 NOTE — Progress Notes (Signed)
Subjective:    Patient ID: Catherine Gardner, female    DOB: 07/23/1961, 54 y.o.   MRN: [id number]  HPI  She is here for rash, joint pain.  Two nights ago she went to a steak house for dinner with her husband.  She and her husband had an uneasy night - their stomach was just off.  Her husband had a rash the next day.  She developed a rash later that day as well.  The rash is on her arms, palms, chest, thighs and her feet.  It is on her face and scalp.  Her and her husband took benadryl last night.  His rash was gone this morning and he feels fine. He did not experience any other symptoms.  She still has the rash/hives.  She was achey last night and had joint pain.  Her joints feel slightly swollen and stiff. She took advil.  She has had cough, sneezing, scratchy throat as well.  She denies any fevers, chills or fatigue.  She denies any recent travel for her husband. She denies any recent medication changes or new exposures.  Medications and allergies reviewed with patient and updated if appropriate.  Patient Active Problem List   Diagnosis Date Noted  . 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 Memory loss 03/25/2015  . Chronic pain syndrome 03/25/2015  . Memory dysfunction 02/16/2015  . Nonallopathic lesion of cervical region 12/25/2014  . Trigger point of left shoulder region 12/05/2014  . Whiplash injuries 12/04/2014  . Acute hemorrhoid 10/04/2014  . Fractured coccyx (HCC) 10/04/2014  . Osteopenia 10/04/2014  . Hormone replacement therapy 05/01/2014  . Nonallopathic lesion of thoracic region 03/02/2014  . Nonallopathic lesion of lumbosacral region 03/02/2014  . Nonallopathic lesion of sacral region 03/02/2014  . Trapezius muscle spasm 02/23/2014  . Menopausal symptoms 10/28/2013  . ANA positive 10/28/2013  . Chronic lower back pain 10/28/2013  . 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 Varicose veins with pain 05/06/2013  . Abdominal pain, other  specified site 09/16/2012  . Polyarthralgia 09/16/2012  . Palpitations 10/26/2011  . Back pain 10/26/2011  . Dyspepsia and other specified disorders of function of stomach 06/06/2011  . Preventative health care 02/27/2011  . GERD (gastroesophageal reflux disease) 02/27/2011  . ANXIETY 12/27/2010  . INSOMNIA 06/02/2010  . RASH-NONVESICULAR 06/02/2010  . SHINGLES 11/09/2009  . VITAMIN D DEFICIENCY 07/28/2009  . HYPERLIPIDEMIA 10/31/2007  . 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 Depression 10/31/2007  . Essential hypertension 10/31/2007  . ARTHRITIS 10/31/2007  . POLYARTHRALGIA 10/31/2007  . LOW BACK PAIN 10/31/2007  . Fibromyalgia 10/31/2007    Past Medical History  Diagnosis Date  . SHINGLES 11/09/2009  . VITAMIN D DEFICIENCY 07/28/2009  . HYPERLIPIDEMIA 10/31/2007  . 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 DEPRESSION 10/31/2007  . HYPERTENSION 10/31/2007  . SINUSITIS- ACUTE-NOS 12/20/2009  . ARTHRITIS 10/31/2007  . POLYARTHRALGIA 10/31/2007  . LOW BACK PAIN 10/31/2007  . FIBROMYALGIA 10/31/2007  . OSTEOPENIA 10/31/2007  . INSOMNIA 06/02/2010  . RASH-NONVESICULAR 06/02/2010  . Headache(784.0) 07/12/2009  . ANXIETY 12/27/2010  . Chronic lower back pain 10/28/2013  . Arachnoiditis     Past Surgical History  Procedure Laterality Date  . Abdominal hysterectomy    . S/p diskectomy  2004    L5  . Lumbar fusion  2008  . S/p right foot morton's neuroma  Jan. 2011  . Implant      neuro spine stimulator    Social History   Social History  .  Marital Status: Married    Spouse Name: N/A  . Number of Children: 2  . Years of Education: N/A   Occupational History  . disablied    Social History Main Topics  . Smoking status: Never Smoker   . Smokeless tobacco: Never Used  . Alcohol Use: 0.0 oz/week    0 Standard drinks or equivalent per week     Comment: occ  . Drug Use: No  . Sexual Activity: Not on file   Other Topics Concern  . Not on file   Social History Narrative     Review of Systems  Constitutional: Negative for fever, chills, appetite change and fatigue.  HENT: Positive for rhinorrhea, sneezing and sore throat (scratchy throat). Negative for congestion, sinus pressure and trouble swallowing.   Respiratory: Positive for cough (dry). Negative for shortness of breath and wheezing.   Cardiovascular: Negative for chest pain and palpitations.  Gastrointestinal: Negative for nausea, abdominal pain and diarrhea.  Musculoskeletal: Positive for myalgias, joint swelling and arthralgias (all joints feel achey).  Skin: Positive for rash.  Neurological: Positive for light-headedness. Negative for dizziness and headaches.       Objective:   Filed Vitals:   09/21/15 0902  BP: 132/84  Pulse: 75  Temp: 97.8 F (36.6 C)  Resp: 16   Filed Weights   09/21/15 0902  Weight: 187 lb (84.823 kg)   Body mass index is 32.08 kg/(m^2).   Physical Exam  Constitutional: She appears well-developed and well-nourished.  HENT:  Head: Normocephalic and atraumatic.  Right Ear: External ear normal.  Left Ear: External ear normal.  Mouth/Throat: Oropharynx is clear and moist.  Eyes: Conjunctivae are normal.  Neck: Neck supple. No tracheal deviation present. No thyromegaly present.  Cardiovascular: Normal rate, regular rhythm and normal heart sounds.   No murmur heard. Pulmonary/Chest: Effort normal and breath sounds normal. No respiratory distress. She has no wheezes.  Musculoskeletal: She exhibits no edema (in LE).  Possibly mild finger joint swelling, stiffness on exam   Lymphadenopathy:    She has no cervical adenopathy.  Skin: Skin is warm and dry. Rash (fine macular papular rash throughout body) noted.        Assessment & Plan:   Viral illness Her symptoms are consistent with a viral illness Explained to her that the rash and joint swelling/pain can be part of a viral illness and it goes along with her other mild cold symptoms The fact that her  husband has recovered is a good sign that lately her symptoms should resolve in the next day or 2 Benadryl at night as needed Claritin or Allegra during the day for the rash/itch is needed She can continue taking Advil 800 mg 3 times daily with food-advised her not to take this more than a couple of days Her symptoms do not improve or resolve over the next 48 hours she will call

## 2015-09-21 NOTE — Progress Notes (Signed)
Pre visit review using our clinic review tool, if applicable. No additional management support is needed unless otherwise documented below in the visit note. 

## 2015-09-21 NOTE — Patient Instructions (Addendum)
Will try tramadol extended release 5 tablets 100 mg. If this is helpful and you can tolerate it we can call in additional prescription. This is also available in 200 and 300 mg tablets  Other options for pain instead of tramadol would include Nucynta, As well as buprenorphine patch  We discussed acupuncture as a possible treatment option. There are also some new were peripheral nerve stimulators that do not require permanent implantation. This would be something that could be an option if the acupuncture is not helpful or covered by insurance.   Pain psychology may be another good treatment

## 2015-09-21 NOTE — Progress Notes (Signed)
Subjective:    Patient ID: Catherine LukesCynthia Gardner, female    DOB: 1961-10-18, 54 y.o.   MRN: [id number]  HPI CC: Lumbar pain 54 year old female with history of low back pain since the 1970s. Had gradual worsening of pain and started to have more nerve pain in both legs left greater than right in 2008. She underwent an L5-S1 decompression and fusion in 2008 by Dr. Nelda SevereMichael Tooke.  Patient did okay for about 6 months postoperatively and then had gradual worsening of pain once again including pain going into her legs. She underwent evaluation and placement of a spinal [facility] She had improvement of her pain for approximate 6 months but starting to get jolting pain in her kidney area. She underwent reprogramming of the stimulator but eventually had it turned off. She had the leads removed last month.  Dr Precious Gildingorrealba [facility] removed the electrodes. Patient has had no MRIs since the spinal cord stimulator was placed. Reviewed prior MRI from 2010 which showed no residual compression of the nerves. There was also CT scans of the lumbar and thoracic spine in 2016 ordered by spine and scoliosis center. Plans are for a repeat MRI in February  Gabapentin has been tried but this caused some imbalance and did not help much with pain, Cymbalta was tried did not tolerate higher dose, did not help pain. Lyrica and Savella were also not helpful. Allergies listed to morphine, meperidine, hydromorphone. Patient also gets nausea from oxycodone and hydrocodone,  Nucynta did not help she does not recall any side effects  Had spine injections- caudal injection, Patient states that these were not helpful and were very painful  Reduced level of activity, doesn't clean house Does laundry and dinner No walking on regular basis  Pain Inventory Average Pain 6 Pain Right Now 5 My pain is burning, tingling and aching  In the last 24 hours, has pain interfered with the  following? General activity 7 Relation with others 6 Enjoyment of life 6 What TIME of day is your pain at its worst? Morning, Daytime, Evening and Night Sleep (in general) Poor  Pain is worse with: walking, sitting and standing Pain improves with: rest and medication Relief from Meds: 5  Mobility walk with assistance use a cane how many minutes can you walk? 5 ability to climb steps?  yes do you drive?  yes  Function disabled: date disabled 04/2007 I need assistance with the following:  household duties and shopping  Neuro/Psych weakness numbness tingling trouble walking spasms dizziness depression anxiety  Prior Studies Any changes since last visit?  no CLINICAL DATA: 54 year old female with a history of bilateral leg weakness and posterior thigh numbness. Prior surgical fusion 2010. Prior laminectomy 1989 and 2005. Stimulator in place  EXAM: CT THORACIC AND LUMBAR SPINE WITHOUT CONTRAST  TECHNIQUE: Multidetector CT imaging of the thoracic and lumbar spine was performed without contrast. Multiplanar CT image reconstructions were also generated.  COMPARISON: Prior CT with myelogram 10/01/2014  FINDINGS: CT THORACIC SPINE FINDINGS  There is normal alignment of the thoracic spine. Mild kyphotic curvature.  Mild disc degeneration. Anterior osteophyte production.  No bony canal narrowing.  No spondylosis is identified and there is no spinal or foraminal stenosis.  There is no prevertebral soft tissue thickening.  No fracture is identified in the thoracic spine. No mass lesion is present.  Terminal aspect of 2 parallel spinal stimulators at the posterior canal at T8.  CT LUMBAR SPINE FINDINGS  The lumbar  alignment is normal. No fracture or mass lesion is identified.  Vertebral body heights maintained.  Disc spaces of T12-L5 relatively maintained.  Disc space narrowing with endplate changes at L5-S1.  Surgical changes of  posterior lumbar interbody fusion with bilateral pedicle screw and rod fixation of L5-S1.  No significant neural foraminal narrowing is evident throughout the lumbar spine.  Surgical changes of left laminectomy of L5.  Generator for neural spinal stimulator within the posterior soft tissues on the right.  Unremarkable appearance of the paravertebral soft tissues.  IMPRESSION: CT THORACIC SPINE IMPRESSION  No CT evidence of acute fracture or malalignment of the thoracic spine.  Mild degenerative disc disease.  Parallel spinal stimulators (2) within the posterior canal terminate at the T8 level.  CT LUMBAR SPINE IMPRESSION  No CT evidence of acute fracture or malalignment of the lumbar spine.  Surgical changes of prior posterior lumbar interbody fusion with bilateral pedicle screw and rod fixation of L5-S1. No complicating feature of the hardware.  Surgical changes of left L5 laminectomy.  Signed,  Yvone Neu. Loreta Ave, DO  [facility]  [facility]   Physicians involved in your care Any changes since last visit?  no   Family History  Problem Relation Age of Onset  . ADD / ADHD Daughter   . Depression Daughter   . Hyperlipidemia Other   . Alcohol abuse Other     alcholism/addiction  . Crohn's disease Other   . Leukemia Father   . GER disease Mother   . Ovarian cancer Maternal Aunt   . Throat cancer Paternal Uncle    Social History   Social History  . Marital Status: Married    Spouse Name: N/A  . Number of Children: 2  . Years of Education: N/A   Occupational History  . disablied    Social History Main Topics  . Smoking status: Never Smoker   . Smokeless tobacco: Never Used  . Alcohol Use: 0.0 oz/week    0 Standard drinks or equivalent per week     Comment: occ  . Drug Use: No  . Sexual Activity: Not Asked   Other Topics Concern  . None   Social History Narrative   Past  Surgical History  Procedure Laterality Date  . Abdominal hysterectomy    . S/p diskectomy  2004    L5  . Lumbar fusion  2008  . S/p right foot morton's neuroma  Jan. 2011  . Implant      neuro spine stimulator   Past Medical History  Diagnosis Date  . SHINGLES 11/09/2009  . VITAMIN D DEFICIENCY 07/28/2009  . HYPERLIPIDEMIA 10/31/2007  . 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 DEPRESSION 10/31/2007  . HYPERTENSION 10/31/2007  . SINUSITIS- ACUTE-NOS 12/20/2009  . ARTHRITIS 10/31/2007  . POLYARTHRALGIA 10/31/2007  . LOW BACK PAIN 10/31/2007  . FIBROMYALGIA 10/31/2007  . OSTEOPENIA 10/31/2007  . INSOMNIA 06/02/2010  . RASH-NONVESICULAR 06/02/2010  . Headache(784.0) 07/12/2009  . ANXIETY 12/27/2010  . Chronic lower back pain 10/28/2013  . Arachnoiditis    BP 144/103 mmHg  Pulse 62  Resp 16  SpO2 99%  Opioid Risk Score:   Fall Risk Score:  `1  Depression screen PHQ 2/9  Depression screen [facility] 2/9 09/21/2015 02/16/2015  Decreased Interest 1 0  Down, Depressed, Hopeless 1 0  PHQ - 2 Score 2 0  Altered sleeping 2 -  Tired, decreased energy 2 -  Change in appetite 1 -  Feeling bad or failure about  yourself  1 -  Trouble concentrating 1 -  Moving slowly or fidgety/restless 1 -  Suicidal thoughts 1 -  PHQ-9 Score 11 -  Difficult doing work/chores Somewhat difficult -     Review of Systems  Constitutional: Positive for diaphoresis.  Gastrointestinal: Positive for constipation.  Genitourinary:       Urine retention  Musculoskeletal:       Spasms  Skin: Positive for rash.  Neurological: Positive for dizziness, weakness and numbness.       Tingling Trouble walking  Psychiatric/Behavioral: The patient is nervous/anxious.        Depression  All other systems reviewed and are negative.      Objective:   Physical Exam  Constitutional: She is oriented to person, place, and time. She appears well-developed and well-nourished.  HENT:  Head: Normocephalic and atraumatic.  Eyes:  Conjunctivae and EOM are normal. Pupils are equal, round, and reactive to light.  Neck: Normal range of motion.  Neurological: She is alert and oriented to person, place, and time.  Reflex Scores:      Tricep reflexes are 2+ on the right side and 2+ on the left side.      Bicep reflexes are 2+ on the right side and 2+ on the left side.      Brachioradialis reflexes are 2+ on the right side and 2+ on the left side.      Patellar reflexes are 2+ on the right side and 2+ on the left side.      Achilles reflexes are 2+ on the right side and 1+ on the left side. 5/5 in bilateral delt, bi, tri, grip, HF, KE, ADF  Psychiatric: She has a normal mood and affect.  Nursing note and vitals reviewed.  FM tender points 15/18, knees ok right elbow ok Pain with lumbar extension greater than with flexion Lumbar range of motion 0-25% flexion and extension lateral bending and rotation      Assessment & Plan:  1. 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 She has been evaluated by spine physicians as well as pain management physicians. She has had implantation of spinal cord stimulation device and then removal.  She has multiple drug intolerance as well as multiple drugs that have been tried and have been ineffective.  She has problems with chronic deconditioning and has symptoms of fear avoidance of activity.  Recommendations include Trial of tramadol ER to see if this formulation is better tolerated from a GI standpoint. Start at 100 mg and if this is tolerated but not effective would go up to 200 mg. 5 tablets given as a trial.  If this is not helpful may consider buprenorphine patch or sublingual  Nucynta also may be a treatment option  , she has neuropathic pain in addition to her axial pain  Would benefit from acupuncture trial. She will call her insurance to see if this is covered.  Would also recommend increasing her activity, we'll try  to control pain better before introducing this. Aquatic exercise may be an option  He also discussed that cognitive behavioral therapy may be a good treatment option and a referral to pain psychology would be needed for this.

## 2015-09-21 NOTE — Patient Instructions (Addendum)
Take advil as needed for the next a couple of days - take with food and stop if you develop any stomach upset.  You can take an anti-histamine for the rash if it itches.  You can takes something like claritin or allegra during the day and bendaryl at night.  Increased fluids and rest are essential.   Call if no improvement or with any questions/concerns.

## 2015-09-22 LAB — PMP ALCOHOL METABOLITE (ETG): Ethyl Glucuronide (EtG): NEGATIVE ng/mL

## 2015-09-25 LAB — ZOLPIDEM (LC/MS-MS), URINE
Zolpidem (GC/LC/MS), Ur confirm: NEGATIVE ng/mL (ref <5)
Zolpidem metabolite (GC/LC/MS) Ur, confirm: 114 ng/mL — AB (ref <5)

## 2015-09-25 LAB — TRAMADOL, URINE
N-DESMETHYL-CIS-TRAMADOL: 240 ng/mL (ref <100)
Tramadol, Urine: 609 ng/mL (ref <100)

## 2015-09-28 LAB — PRESCRIPTION MONITORING PROFILE (SOLSTAS)
Amphetamine/Meth: NEGATIVE ng/mL
BARBITURATE SCREEN, URINE: NEGATIVE ng/mL
Benzodiazepine Screen, Urine: NEGATIVE ng/mL
Buprenorphine, Urine: NEGATIVE ng/mL
CANNABINOID SCRN UR: NEGATIVE ng/mL
Carisoprodol, Urine: NEGATIVE ng/mL
Cocaine Metabolites: NEGATIVE ng/mL
Creatinine, Urine: 95.41 mg/dL (ref >20.0)
ECSTASY: NEGATIVE ng/mL
FENTANYL URINE: NEGATIVE ng/mL
Meperidine, Ur: NEGATIVE ng/mL
Methadone Screen, Urine: NEGATIVE ng/mL
Nitrites, Initial: NEGATIVE ug/mL
Opiate Screen, Urine: NEGATIVE ng/mL
Oxycodone Screen, Ur: NEGATIVE ng/mL
PROPOXYPHENE: NEGATIVE ng/mL
Tapentadol, urine: NEGATIVE ng/mL
pH, Initial: 6.2 pH (ref 4.5–8.9)

## 2015-09-30 ENCOUNTER — Other Ambulatory Visit: Payer: Self-pay | Admitting: Physical Medicine & Rehabilitation

## 2015-09-30 NOTE — Telephone Encounter (Signed)
rec'd electronic request for Ultram ER 100 mg.  I called the pt and she agrees that the 5 tablet trial you gave her was a success. She is concerned the blue medicare ppo will not pay....we may have to submit a script and see how it plays out, whether or not it is covered or if we need to do a prior authorization.

## 2015-09-30 NOTE — Telephone Encounter (Signed)
Tramadol ER 100 mg #30     Sig 1 by mouth daily  5 refills

## 2015-09-30 NOTE — Telephone Encounter (Signed)
[facility]

## 2015-10-06 ENCOUNTER — Other Ambulatory Visit: Payer: Self-pay

## 2015-10-06 DIAGNOSIS — Z1231 Encounter for screening mammogram for malignant neoplasm of breast: Secondary | ICD-10-CM

## 2015-10-07 NOTE — Progress Notes (Signed)
Urine drug screen for this encounter is consistent for prescribed medication 

## 2015-10-19 ENCOUNTER — Telehealth: Payer: Self-pay

## 2015-10-19 NOTE — Telephone Encounter (Signed)
Prior authorization for tramadol 100mg  done via cover my meds.  Waiting response.

## 2015-10-25 ENCOUNTER — Ambulatory Visit (HOSPITAL_BASED_OUTPATIENT_CLINIC_OR_DEPARTMENT_OTHER): Payer: Medicare Other | Admitting: Physical Medicine & Rehabilitation

## 2015-10-25 ENCOUNTER — Encounter: Payer: Medicare Other | Attending: Physical Medicine & Rehabilitation

## 2015-10-25 ENCOUNTER — Encounter: Payer: Self-pay | Admitting: Physical Medicine & Rehabilitation

## 2015-10-25 VITALS — BP 165/93 | HR 62

## 2015-10-25 DIAGNOSIS — M8580 Other specified disorders of bone density and structure, unspecified site: Secondary | ICD-10-CM | POA: Insufficient documentation

## 2015-10-25 DIAGNOSIS — Z886 Allergy status to analgesic agent status: Secondary | ICD-10-CM | POA: Diagnosis not present

## 2015-10-25 DIAGNOSIS — D508 Other iron deficiency anemias: Secondary | ICD-10-CM | POA: Insufficient documentation

## 2015-10-25 DIAGNOSIS — M797 Fibromyalgia: Secondary | ICD-10-CM

## 2015-10-25 DIAGNOSIS — M5416 Radiculopathy, lumbar region: Secondary | ICD-10-CM | POA: Insufficient documentation

## 2015-10-25 DIAGNOSIS — E785 Hyperlipidemia, unspecified: Secondary | ICD-10-CM | POA: Diagnosis not present

## 2015-10-25 DIAGNOSIS — R21 Rash and other nonspecific skin eruption: Secondary | ICD-10-CM | POA: Diagnosis not present

## 2015-10-25 DIAGNOSIS — G4700 Insomnia, unspecified: Secondary | ICD-10-CM | POA: Insufficient documentation

## 2015-10-25 DIAGNOSIS — R42 Dizziness and giddiness: Secondary | ICD-10-CM | POA: Insufficient documentation

## 2015-10-25 DIAGNOSIS — I10 Essential (primary) hypertension: Secondary | ICD-10-CM | POA: Diagnosis not present

## 2015-10-25 DIAGNOSIS — F418 Other specified anxiety disorders: Secondary | ICD-10-CM | POA: Diagnosis not present

## 2015-10-25 DIAGNOSIS — M961 Postlaminectomy syndrome, not elsewhere classified: Secondary | ICD-10-CM

## 2015-10-25 DIAGNOSIS — R51 Headache: Secondary | ICD-10-CM | POA: Diagnosis not present

## 2015-10-25 DIAGNOSIS — G894 Chronic pain syndrome: Secondary | ICD-10-CM | POA: Diagnosis not present

## 2015-10-25 DIAGNOSIS — Z5181 Encounter for therapeutic drug level monitoring: Secondary | ICD-10-CM | POA: Diagnosis not present

## 2015-10-25 DIAGNOSIS — R531 Weakness: Secondary | ICD-10-CM | POA: Insufficient documentation

## 2015-10-25 DIAGNOSIS — M2550 Pain in unspecified joint: Secondary | ICD-10-CM | POA: Insufficient documentation

## 2015-10-25 MED ORDER — PROMETHAZINE HCL 12.5 MG PO TABS
12.5000 mg | ORAL_TABLET | Freq: Four times a day (QID) | ORAL | Status: DC | PRN
Start: 2015-10-25 — End: 2015-12-29

## 2015-10-25 NOTE — Progress Notes (Signed)
Subjective:    Patient ID: Catherine Gardner, female    DOB: 18-Oct-1961, 54 y.o.   MRN: [id number]  HPI 54 year old female with a 40 year history of low back pain. She eventually underwent lumbar decompression and fusion At L5-S1In 2008. She did not have any long-term relief with this procedure and developed increasing nerve pain in both lower extremities following this procedure.  She then underwent spinal cord stimulator placement which was once again helpful for only several months. She has more widespread pain as well  Pain Inventory Average Pain 5 Pain Right Now 7 My pain is constant, tingling and aching  In the last 24 hours, has pain interfered with the following? General activity 7 Relation with others 7 Enjoyment of life 8 What TIME of day is your pain at its worst? Morning, Daytime, Evening and Night Sleep (in general) Poor  Pain is worse with: walking, bending, sitting, inactivity and standing Pain improves with: Focused Breathing Relief from Meds: 3  Mobility walk without assistance walk with assistance use a cane how many minutes can you walk? 5 ability to climb steps?  yes do you drive?  yes Do you have any goals in this area?  yes  Function disabled: date disabled 04/2007 I need assistance with the following:  household duties and shopping Do you have any goals in this area?  no  Neuro/Psych weakness numbness tremor tingling trouble walking spasms depression anxiety  Prior Studies Any changes since last visit?  no  Physicians involved in your care Any changes since last visit?  no   Family History  Problem Relation Age of Onset  . ADD / ADHD Daughter   . Depression Daughter   . Hyperlipidemia Other   . Alcohol abuse Other     alcholism/addiction  . Crohn's disease Other   . Leukemia Father   . GER disease Mother   . Ovarian cancer Maternal Aunt   . Throat cancer Paternal Uncle    Social History   Social History  . Marital Status:  Married    Spouse Name: N/A  . Number of Children: 2  . Years of Education: N/A   Occupational History  . disablied    Social History Main Topics  . Smoking status: Never Smoker   . Smokeless tobacco: Never Used  . Alcohol Use: 0.0 oz/week    0 Standard drinks or equivalent per week     Comment: occ  . Drug Use: No  . Sexual Activity: Not Asked   Other Topics Concern  . None   Social History Narrative   Past Surgical History  Procedure Laterality Date  . Abdominal hysterectomy    . S/p diskectomy  2004    L5  . Lumbar fusion  2008  . S/p right foot morton's neuroma  Jan. 2011  . Implant      neuro spine stimulator   Past Medical History  Diagnosis Date  . SHINGLES 11/09/2009  . VITAMIN D DEFICIENCY 07/28/2009  . HYPERLIPIDEMIA 10/31/2007  . 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 DEPRESSION 10/31/2007  . HYPERTENSION 10/31/2007  . SINUSITIS- ACUTE-NOS 12/20/2009  . ARTHRITIS 10/31/2007  . POLYARTHRALGIA 10/31/2007  . LOW BACK PAIN 10/31/2007  . FIBROMYALGIA 10/31/2007  . OSTEOPENIA 10/31/2007  . INSOMNIA 06/02/2010  . RASH-NONVESICULAR 06/02/2010  . Headache(784.0) 07/12/2009  . ANXIETY 12/27/2010  . Chronic lower back pain 10/28/2013  . Arachnoiditis    BP 165/93 mmHg  Pulse 62  SpO2 99%  Opioid Risk Score:  Fall Risk Score:  `1  Depression screen PHQ 2/9  [facility] 09/21/2015 02/16/2015  Decreased Interest 1 0  Down, Depressed, Hopeless 1 0  PHQ - 2 Score 2 0  Altered sleeping 2 -  Tired, decreased energy 2 -  Change in appetite 1 -  Feeling bad or failure about yourself  1 -  Trouble concentrating 1 -  Moving slowly or fidgety/restless 1 -  Suicidal thoughts 1 -  PHQ-9 Score 11 -  Difficult doing work/chores Somewhat difficult -     Review of Systems  Gastrointestinal: Positive for nausea and constipation.  Musculoskeletal: Positive for gait problem.       Spasms  Neurological: Positive for tremors, weakness and numbness.       Tingling    Psychiatric/Behavioral: The patient is nervous/anxious.        Depression  All other systems reviewed and are negative.      Objective:   Physical Exam  Constitutional: She is oriented to person, place, and time. She appears well-developed and well-nourished.  HENT:  Head: Normocephalic and atraumatic.  Eyes: Conjunctivae are normal. Pupils are equal, round, and reactive to light.  Neck: Normal range of motion.  Musculoskeletal:       Cervical back: She exhibits decreased range of motion and tenderness.       Thoracic back: She exhibits decreased range of motion and tenderness.       Lumbar back: She exhibits decreased range of motion and tenderness. She exhibits no deformity.  Tenderness to palpation and 18/18 fibromyalgia tender points  Healed incision in the lower lumbar spine    Neurological: She is alert and oriented to person, place, and time.  Motor strength is 5/5 bilateral hip flexors knee extensors ankle dorsiflexor and plantar flexor Negative straight leg raising test  Psychiatric: She has a normal mood and affect.          Assessment & Plan:  1. Lumbar postlaminectomy syndrome with chronic axial as well as neurogenic pain in both lower limbs. She has tried numerous medications including most opiates, most neurogenic pain medications as well as spinal cord stimulation without benefit. She has numerous drug allergies that include most opiates, nonsteroidal anti-inflammatories,  As well as S NRI  She states she gets some relief from tramadol but paradoxically only in the last 4 hours before the next dose. She does get some sedation with this medication as well as nausea and feels she needs to take promethazine with this. She does not take this medication every day  We discussed what limited treatment options are still available to her in terms of medication management. This includes buprenorphine patch.  She will be printed some information on this and she will further  evaluate this as a potential treatment option.  We also discussed acupuncture. She does not have insurance benefits but may consider paying privately for this  We discussed cognitive behavioral therapy. She states that she will consider this but at this point does not want a referral to a psychologist.  She will continue on her tramadol ER 100 mg per day for now. She Well he does not take this on a daily basis. I will see her back in 6 months.  2. Chronic widespread pain consistent with a fibromyalgia syndrome, she has been tried on several medications for fibromyalgia  But none have been helpful or tolerated Consistent aerobic exercise moderate intensity 20-40 minutes every other day would be helpful for this

## 2015-10-25 NOTE — Patient Instructions (Addendum)
Buprenorphine transdermal patch= Butrans patch What is this medicine? BUPRENORPHINE (byoo pre NOR feen) is a pain reliever. It is used to treat moderate to severe pain. This medicine may be used for other purposes; ask your health care Loula Marcella or pharmacist if you have questions. What should I tell my health care Vitaliy Eisenhour before I take this medicine? They need to know if you have any of these conditions: -blockage in your bowel -brain tumor -drink more than 3 alcohol-containing drinks per day -drug abuse or addiction -fever -head injury -kidney disease -liver disease -lung or breathing disease, like asthma -thyroid disease -trouble passing urine or change in the amount of urine -an unusual or allergic reaction to buprenorphine, other medicines, foods, dyes, or preservatives -pregnant or trying to get pregnant -breast-feeding How should I use this medicine? Apply the patch to your skin. Do not cut or damage the patch. A cut or damaged patch can be very dangerous because you may get too much medicine. Select a clean, dry area of skin on your upper outer arm, upper chest, upper back, or the side of the chest. Do not apply the patch to broken, burned, cut, or irritated skin. Use only water to clean the area. Do not use soap or alcohol to clean the skin because this can increase the effects of the medicine. If the area is hairy, clip the hair with scissors, but do not shave. Take the patch out of its wrapper. Bend the patch along the faint line and slowly peel the outer portion of the liner, which covers the sticky surface of the patch. Press the patch onto the skin and slowly peel off the protective liner. Do not use a patch if the packaging or backing is damaged. Do not touch the sticky part with your fingers. Press the patch to the skin using the palm of your hand. Press the patch to the skin for 15 seconds. Wash your hands at once. Keep patches far away from children. Do not let children see  you apply the patch and do not apply it where children can see it. Do not call the patch a sticker, tattoo, or bandage, as this could encourage the child to mimic your actions. Used patches still contain medicine. Children or pets can have serious side effects or die from putting used patches in their mouth or on their bodies. Take off the old patch before putting on a new patch. Apply each new patch to a different area of skin. If a patch comes off or causes irritation, remove it and apply a new patch to a different site. If the edges of the patch start to loosen, first apply first aid tape to the edges of the patch. If problems with the patch not sticking continue, cover the patch with a see-through adhesive dressing (like Bioclusive or Tegaderm). Never cover the patch with any other bandage or tape. To get rid of used patches, fold the patch in half with the sticky sides together. Then, flush it down the toilet. Alternately, you may dispose of the patch in the Patch-Disposal Unit provided. Never throw the patch away in the trash without sealing it in the Patch-Disposal unit. Replace the patch every 7 days. Follow the directions on the prescription label. Do not use more medicine than you are told to use. A special MedGuide will be given to you by the pharmacist with each prescription and refill. Be sure to read this information carefully each time. Talk to your pediatrician regarding the use  of this medicine in children. Special care may be needed. If a patch accidentally touches the skin, use only water to clean the area. Do not use soap or alcohol to clean the skin because this can increase the effects of the medicine. If someone accidentally uses a buprenorphine patch and is not awake and alert, immediately call 911 for help. If the person is awake and alert, call a doctor, health care professional, or the [facility]. Overdosage: If you think you have taken too much of this medicine contact a  poison control center or emergency room at once. NOTE: This medicine is only for you. Do not share this medicine with others. What if I miss a dose? If you forget to replace your patch, take off the old patch and put on a new patch as soon as you can. Do not apply an extra patch to your skin. Do not wear more than one patch at the same time unless told to do so by your doctor or health care professional. What may interact with this medicine? This medicine may interact with the following medications: -alcohol -antibiotics like clarithromycin, dalfopristin; quinupristin, erythromycin, and rifampin -antihistamines for allergy, cough, and cold -antiviral medicines for HIV or AIDS -atropine -butorphanol -certain medicines for anxiety or sleep -certain medicines for bladder problems like oxybutynin, tolterodine -certain medicines for blood pressure, heart disease, irregular heart beat -certain medicines for depression, anxiety, or psychotic disturbances -certain medicines for fungal infections like ketoconazole and itraconazole -certain medicines for Parkinson's disease like benztropine, trihexyphenidyl -certain medicines for seizures like carbamazepine, phenobarbital, phenytoin -certain medicines for stomach problems like cimetidine, dicyclomine, hyoscyamine -certain medicines for travel sickness like scopolamine -general anesthetics -mifepristone -muscle relaxants -nalbuphine -narcotic medicines for pain -nilotinib -pentazocine -phenothiazines like chlorpromazine, mesoridazine, prochlorperazine, thioridazine -ranolazine This list may not describe all possible interactions. Give your health care Kaiana Marion a list of all the medicines, herbs, non-prescription drugs, or dietary supplements you use. Also tell them if you smoke, drink alcohol, or use illegal drugs. Some items may interact with your medicine. What should I watch for while using this medicine? Other pain medicine may be needed  when you first start using the patch because the patch can take some time to start working. Tell your doctor or health care professional if your pain does not go away, if it gets worse, or if you have new or a different type of pain. You may develop tolerance to the medicine. Tolerance means that you will need a higher dose of the medicine for pain relief. Tolerance is normal and is expected if you take the medicine for a long time. Do not suddenly stop taking your medicine because you may develop a severe reaction. Your body becomes used to the medicine. This does NOT mean you are addicted. Addiction is a behavior related to getting and using a drug for a non-medical reason. If you have pain, you have a medical reason to take pain medicine. Your doctor will tell you how much medicine to take. If your doctor wants you to stop the medicine, the dose will be slowly lowered over time to avoid any side effects. You may get drowsy or dizzy. Do not drive, use machinery, or do anything that needs mental alertness until you know how this medicine affects you. Do not stand or sit up quickly, especially if you are an older patient. This reduces the risk of dizzy or fainting spells. Alcohol may interfere with the effect of this medicine. Avoid alcoholic drinks.  There are different types of narcotic medicines (opiates) for pain. If you take more than one type at the same time, you may have more side effects. Give your health care Jackson Coffield a list of all medicines you use. Your doctor will tell you how much medicine to take. Do not take more medicine than directed. Call emergency for help if you have problems breathing. The medicine will cause constipation. Try to have a bowel movement at least every 2 to 3 days. If you do not have a bowel movement for 3 days, call your doctor or health care professional. Your mouth may get dry. Chewing sugarless gum or sucking hard candy, and drinking plenty of water may help. Contact your  doctor if the problem does not go away or is severe. Heat can increase the amount of medicine released from the patch. Do not get the patch hot by using heating pads, heated water beds, electric blankets, and heat lamps. You can bathe or swim while using the patch. But, do not use a sauna or hot tub. Tell you doctor or health care professional if you get a fever. What side effects may I notice from receiving this medicine? Side effects that you should report to your doctor or health care professional as soon as possible: -allergic reactions like skin rash, itching or hives, swelling of the face, lips, or tongue -anxiety, irritability, nervousness or restlessness -breathing problems -cold, clammy skin or sweating -confusion -diarrhea -feeling faint or lightheaded, falls -stomach pain or vomiting -swelling of ankles -trouble passing urine or change in the amount of urine -unusually weak or tired -yellowing of the eyes or skin Side effects that usually do not require medical attention (report these to your doctor or health care professional if they continue or are bothersome): -constipation -difficulty sleeping -dry mouth -headache -itching, redness, or rash at the patch site -nausea -vomiting This list may not describe all possible side effects. Call your doctor for medical advice about side effects. You may report side effects to FDA at 1-800-FDA-1088. Where should I keep my medicine? Keep out of the reach of children. This medicine can be abused. Keep your medicine in a safe place to protect it from theft. Do not share this medicine with anyone. Selling or giving away this medicine is dangerous and against the law. Store at room temperature between 59 and 86 degrees F (15 and 30 degrees C). Do not store the patches out of their wrappers. This medicine may cause accidental overdose and death if it is taken by other adults, children, or pets. Flush any unused medicine down the toilet as  instructed above to reduce the chance of harm. Alternately, you may dispose of the patch in the Patch-Disposal Unit provided. Do not use the medicine after the expiration date. NOTE: This sheet is a summary. It may not cover all possible information. If you have questions about this medicine, talk to your doctor, pharmacist, or health care Liev Brockbank.    2016, [employer]. (2014-10-21 14:58:29)

## 2015-11-04 ENCOUNTER — Other Ambulatory Visit: Payer: Self-pay | Admitting: Internal Medicine

## 2015-11-04 DIAGNOSIS — M8580 Other specified disorders of bone density and structure, unspecified site: Secondary | ICD-10-CM

## 2015-11-08 ENCOUNTER — Other Ambulatory Visit: Payer: Medicare Other

## 2015-11-08 ENCOUNTER — Other Ambulatory Visit: Payer: Self-pay

## 2015-11-08 ENCOUNTER — Ambulatory Visit
Admission: RE | Admit: 2015-11-08 | Discharge: 2015-11-08 | Disposition: A | Discharge status: Home / Self Care | Payer: Medicare Other | Source: Ambulatory Visit | Attending: Internal Medicine | Admitting: Internal Medicine

## 2015-11-08 ENCOUNTER — Ambulatory Visit
Admission: RE | Admit: 2015-11-08 | Discharge: 2015-11-08 | Disposition: A | Discharge status: Home / Self Care | Payer: Medicare Other | Source: Ambulatory Visit

## 2015-11-08 ENCOUNTER — Other Ambulatory Visit: Payer: Self-pay | Admitting: Internal Medicine

## 2015-11-08 DIAGNOSIS — M8580 Other specified disorders of bone density and structure, unspecified site: Secondary | ICD-10-CM

## 2015-11-08 DIAGNOSIS — Z1231 Encounter for screening mammogram for malignant neoplasm of breast: Secondary | ICD-10-CM

## 2015-11-08 HISTORY — DX: Age-related osteoporosis without current pathological fracture: M81.0

## 2015-11-18 ENCOUNTER — Encounter: Payer: Self-pay | Admitting: Internal Medicine

## 2015-11-18 ENCOUNTER — Ambulatory Visit (INDEPENDENT_AMBULATORY_CARE_PROVIDER_SITE_OTHER): Payer: Medicare Other | Admitting: Internal Medicine

## 2015-11-18 ENCOUNTER — Other Ambulatory Visit (INDEPENDENT_AMBULATORY_CARE_PROVIDER_SITE_OTHER): Payer: Medicare Other

## 2015-11-18 VITALS — BP 128/80 | HR 64 | Temp 97.7°F | Ht 64.0 in | Wt 188.0 lb

## 2015-11-18 DIAGNOSIS — F32A Depression, unspecified: Secondary | ICD-10-CM

## 2015-11-18 DIAGNOSIS — F329 Major depressive disorder, single episode, unspecified: Secondary | ICD-10-CM | POA: Diagnosis not present

## 2015-11-18 DIAGNOSIS — M810 Age-related osteoporosis without current pathological fracture: Secondary | ICD-10-CM | POA: Diagnosis not present

## 2015-11-18 DIAGNOSIS — I10 Essential (primary) hypertension: Secondary | ICD-10-CM | POA: Diagnosis not present

## 2015-11-18 DIAGNOSIS — G4700 Insomnia, unspecified: Secondary | ICD-10-CM | POA: Diagnosis not present

## 2015-11-18 LAB — VITAMIN D 25 HYDROXY (VIT D DEFICIENCY, FRACTURES): VITD: 24.38 ng/mL — AB (ref 30.00–100.00)

## 2015-11-18 MED ORDER — ZOLPIDEM TARTRATE 10 MG PO TABS: 10.0000 mg | ORAL_TABLET | Freq: Every evening | ORAL | Status: DC | PRN

## 2015-11-18 NOTE — Patient Instructions (Addendum)
OK to continue the estradiol for now  Please continue all other medications as before, and refills have been done if requested - the ambien  Please have the pharmacy call with any other refills you may need.  Please keep your appointments with your specialists as you may have planned  You will be contacted regarding the referral for: endocrinology  Please go to the LAB in the Basement (turn left off the elevator) for the tests to be done today  You will be contacted by phone if any changes need to be made immediately.  Otherwise, you will receive a letter about your results with an explanation, but please check with MyChart first.  Please remember to sign up for MyChart if you have not done so, as this will be important to you in the future with finding out test results, communicating by private email, and scheduling acute appointments online when needed.

## 2015-11-18 NOTE — Progress Notes (Signed)
Pre visit review using our clinic review tool, if applicable. No additional management support is needed unless otherwise documented below in the visit note. 

## 2015-11-18 NOTE — Progress Notes (Signed)
Subjective:    Patient ID: Catherine Gardner, female    DOB: [birth year], 54 y.o.   MRN: [id number]  HPI  Here to f/u recent dxa with worst t-score at -3.6, not sure about vit d status, no prior hx of osteoporosis or fx, though she is convinced she has lost an inch due to spine curvature. Has been on ERT x 2 yrs but has only taken intermittently. Did have a year of actonel about 10 yrs ago for significant osteopenia.   Pt denies chest pain, increased sob or doe, wheezing, orthopnea, PND, increased LE swelling, palpitations, dizziness or syncope. Pt denies new neurological symptoms such as new headache, or facial or extremity weakness or numbness  Pt denies polydipsia, polyuria,  Also with ongoing insomnia, hard to get to sleep nightly, Denies worsening depressive symptoms, suicidal ideation, or panic; has ongoing anxiety Past Medical History  Diagnosis Date  . SHINGLES 11/09/2009  . VITAMIN D DEFICIENCY 07/28/2009  . HYPERLIPIDEMIA 10/31/2007  . 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 DEPRESSION 10/31/2007  . HYPERTENSION 10/31/2007  . SINUSITIS- ACUTE-NOS 12/20/2009  . ARTHRITIS 10/31/2007  . POLYARTHRALGIA 10/31/2007  . LOW BACK PAIN 10/31/2007  . FIBROMYALGIA 10/31/2007  . Osteoporosis 10/31/2007  . INSOMNIA 06/02/2010  . RASH-NONVESICULAR 06/02/2010  . Headache(784.0) 07/12/2009  . ANXIETY 12/27/2010  . Chronic lower back pain 10/28/2013  . Arachnoiditis    Past Surgical History  Procedure Laterality Date  . Abdominal hysterectomy    . S/p diskectomy  2004    L5  . Lumbar fusion  2008  . S/p right foot morton's neuroma  Jan. 2011  . Implant      neuro spine stimulator    reports that she has never smoked. She has never used smokeless tobacco. She reports that she drinks alcohol. She reports that she does not use illicit drugs. family history includes ADD / ADHD in her daughter; Alcohol abuse in her other; Crohn's disease in her other; Depression in her daughter; GER disease in her mother;  Hyperlipidemia in her other; Leukemia in her father; Ovarian cancer in her maternal aunt; Throat cancer in her paternal uncle. Allergies  Allergen Reactions  . Etodolac Other (See Comments)    Chest pain  . Meperidine Hcl Hives       . Morphine Hives  . Penicillins Hives       . Dilaudid [Hydromorphone Hcl]   . Effexor [Venlafaxine]     incr anxiety  . Imitrex [Sumatriptan Base]   . Benazepril Other (See Comments)    Hair falls out  . Nickel Rash       . Simvastatin Other (See Comments)    Join pain and itching   Current Outpatient Prescriptions on File Prior to Visit  Medication Sig Dispense Refill  . calcium-vitamin D (OSCAL WITH D) 500-200 MG-UNIT per tablet Take 1 tablet by mouth daily.      . [facility] SENNA PLUS 8.6-50 MG tablet TAKE 3 TABLETS EVERY DAY  0  . estradiol (ESTRACE) 1 MG tablet TAKE 1 TABLET BY MOUTH EVERY DAY 90 tablet 1  . hydrochlorothiazide (MICROZIDE) 12.5 MG capsule TAKE 1 CAPSULE (12.5 MG TOTAL) BY MOUTH DAILY. 90 capsule 1  . labetalol (NORMODYNE) 200 MG tablet TAKE 1 TABLET (200 MG TOTAL) BY MOUTH 2 (TWO) TIMES DAILY. 180 tablet 3  . traMADol (ULTRAM-ER) 100 MG 24 hr tablet TAKE 1 TABLET EVERY DAY 30 tablet 5  . baclofen (LIORESAL) 10 MG tablet Take 1 tablet (10 mg total) by  mouth 3 (three) times daily. (Patient not taking: Reported on 10/25/2015) 30 each 0  . promethazine (PHENERGAN) 12.5 MG tablet Take 12.5 mg by mouth daily. Reported on 11/18/2015    . promethazine (PHENERGAN) 12.5 MG tablet Take 1 tablet (12.5 mg total) by mouth every 6 (six) hours as needed for nausea or vomiting. (Patient not taking: Reported on 11/18/2015) 30 tablet 5   No current facility-administered medications on file prior to visit.   Review of Systems  Constitutional: Negative for unusual diaphoresis or night sweats HENT: Negative for ringing in ear or discharge Eyes: Negative for double vision or worsening visual disturbance.  Respiratory: Negative for choking and stridor.     Gastrointestinal: Negative for vomiting or other signifcant bowel change Genitourinary: Negative for hematuria or change in urine volume.  Musculoskeletal: Negative for other MSK pain or swelling Skin: Negative for color change and worsening wound.  Neurological: Negative for tremors and numbness other than noted  Psychiatric/Behavioral: Negative for decreased concentration or agitation other than above       Objective:   Physical Exam BP 128/80 mmHg  Pulse 64  Temp(Src) 97.7 F (36.5 C) (Oral)  Ht  (1.626 m)  Wt 188 lb (85.276 kg)  BMI 32.25 kg/m2  SpO2 97% VS noted,  Constitutional: Pt appears in no significant distress HENT: Head: NCAT.  Right Ear: External ear normal.  Left Ear: External ear normal.  Eyes: . Pupils are equal, round, and reactive to light. Conjunctivae and EOM are normal Neck: Normal range of motion. Neck supple.  Cardiovascular: Normal rate and regular rhythm.   Pulmonary/Chest: Effort normal and breath sounds without rales or wheezing.  Neurological: Pt is alert. Not confused , motor grossly intact Skin: Skin is warm. No rash, no LE edema Psychiatric: Pt behavior is normal. No agitation. mod nervous    Assessment & Plan:

## 2015-11-19 LAB — PTH, INTACT AND CALCIUM
Calcium: 9.7 mg/dL (ref 8.4–10.5)
PTH: 62 pg/mL (ref 14–64)

## 2015-11-20 NOTE — Assessment & Plan Note (Signed)
Ok for zolipidem refill,  to f/u any worsening symptoms or concerns

## 2015-11-20 NOTE — Assessment & Plan Note (Signed)
Severe premature for age, for PTH, vit d levels, take ERT daily for now, also for endo referral for opinion on optimizing tx, to avoid falls especially from ht

## 2015-11-20 NOTE — Assessment & Plan Note (Signed)
stable overall by history and exam, recent data reviewed with pt, and pt to continue medical treatment as before,  to f/u any worsening symptoms or concerns BP Readings from Last 3 Encounters:  11/18/15 128/80  10/25/15 165/93  09/21/15 144/103

## 2015-11-20 NOTE — Assessment & Plan Note (Signed)
stable overall by history and exam, recent data reviewed with pt, and pt to continue medical treatment as before,  to f/u any worsening symptoms or concerns Lab Results  Component Value Date   WBC 7.1 02/16/2015   HGB 13.6 02/16/2015   HCT 39.3 02/16/2015   PLT 239.0 02/16/2015   GLUCOSE 92 02/16/2015   CHOL 246* 02/16/2015   TRIG 67.0 02/16/2015   HDL 72.30 02/16/2015   LDLDIRECT 125.1 10/30/2013   LDLCALC 160* 02/16/2015   ALT 13 02/16/2015   AST 16 02/16/2015   NA 139 02/16/2015   K 4.1 02/16/2015   CL 102 02/16/2015   CREATININE 0.86 02/16/2015   BUN 12 02/16/2015   CO2 33* 02/16/2015   TSH 1.13 02/16/2015

## 2015-12-01 ENCOUNTER — Telehealth: Payer: Self-pay | Admitting: Internal Medicine

## 2015-12-01 NOTE — Telephone Encounter (Signed)
Pt called in stating she's needing a referral to Dr. Dorisann FramesBalan Bindubal endocrinologist

## 2015-12-02 NOTE — Telephone Encounter (Signed)
Pt called to check up on this request. Dr. Jonny RuizJohn please put in another referral for this.

## 2015-12-02 NOTE — Telephone Encounter (Signed)
Referral was placed dec 22  Unfortunately I have no control over the speed with which the referral process is completed  Pt should hear hopefully soon. thanks

## 2015-12-03 NOTE — Telephone Encounter (Signed)
Referral faxed to [facility]. See referral.

## 2015-12-06 ENCOUNTER — Ambulatory Visit: Payer: Medicare Other | Admitting: Endocrinology

## 2015-12-16 ENCOUNTER — Other Ambulatory Visit: Payer: Self-pay | Admitting: Orthopedic Surgery

## 2015-12-16 DIAGNOSIS — T84296A Other mechanical complication of internal fixation device of vertebrae, initial encounter: Secondary | ICD-10-CM

## 2015-12-18 ENCOUNTER — Ambulatory Visit
Admission: RE | Admit: 2015-12-18 | Discharge: 2015-12-18 | Disposition: A | Discharge status: Home / Self Care | Payer: Medicare Other | Source: Ambulatory Visit | Attending: Orthopedic Surgery | Admitting: Orthopedic Surgery

## 2015-12-18 DIAGNOSIS — T84296A Other mechanical complication of internal fixation device of vertebrae, initial encounter: Secondary | ICD-10-CM

## 2015-12-29 ENCOUNTER — Other Ambulatory Visit: Payer: Self-pay | Admitting: *Deleted

## 2015-12-29 ENCOUNTER — Telehealth: Payer: Self-pay | Admitting: *Deleted

## 2015-12-29 MED ORDER — PROMETHAZINE HCL 25 MG PO TABS: ORAL_TABLET | ORAL | Status: DC

## 2015-12-29 NOTE — Telephone Encounter (Signed)
New order placed, sent electronically to pharmacy

## 2015-12-29 NOTE — Telephone Encounter (Signed)
Promethazine 25 mg one half to one tablet per day #30, 2 refills

## 2015-12-29 NOTE — Telephone Encounter (Signed)
Prior authorization for promethazine 12.5 mg tabs denied. Suggested alternatives from the insurance include promethazine hcl  25 mg or promethazine hcl syrup 6.25 mg/1ml.  These too may need prior auth, but, it is what they suggest. Please advise

## 2016-01-20 ENCOUNTER — Ambulatory Visit: Payer: Self-pay | Admitting: Family Medicine

## 2016-01-20 ENCOUNTER — Other Ambulatory Visit: Payer: Self-pay | Admitting: Internal Medicine

## 2016-01-20 NOTE — Telephone Encounter (Signed)
Done hardcopy to Corinne  

## 2016-01-20 NOTE — Telephone Encounter (Signed)
Please advise, patient is needing refill on [place]

## 2016-01-24 ENCOUNTER — Other Ambulatory Visit: Payer: Self-pay | Admitting: Orthopedic Surgery

## 2016-01-24 DIAGNOSIS — M4722 Other spondylosis with radiculopathy, cervical region: Secondary | ICD-10-CM

## 2016-01-29 ENCOUNTER — Ambulatory Visit
Admission: RE | Admit: 2016-01-29 | Discharge: 2016-01-29 | Disposition: A | Discharge status: Home / Self Care | Payer: Medicare Other | Source: Ambulatory Visit | Attending: Orthopedic Surgery | Admitting: Orthopedic Surgery

## 2016-01-29 DIAGNOSIS — M4722 Other spondylosis with radiculopathy, cervical region: Secondary | ICD-10-CM

## 2016-02-18 ENCOUNTER — Other Ambulatory Visit: Payer: Self-pay | Admitting: Internal Medicine

## 2016-04-25 ENCOUNTER — Encounter: Payer: Medicare Other | Attending: Physical Medicine & Rehabilitation

## 2016-04-25 ENCOUNTER — Ambulatory Visit (HOSPITAL_BASED_OUTPATIENT_CLINIC_OR_DEPARTMENT_OTHER): Payer: Medicare Other | Admitting: Physical Medicine & Rehabilitation

## 2016-04-25 ENCOUNTER — Telehealth: Payer: Self-pay | Admitting: *Deleted

## 2016-04-25 ENCOUNTER — Encounter: Payer: Self-pay | Admitting: Physical Medicine & Rehabilitation

## 2016-04-25 VITALS — BP 158/92 | HR 73 | Resp 14

## 2016-04-25 DIAGNOSIS — M810 Age-related osteoporosis without current pathological fracture: Secondary | ICD-10-CM | POA: Insufficient documentation

## 2016-04-25 DIAGNOSIS — M797 Fibromyalgia: Secondary | ICD-10-CM | POA: Diagnosis present

## 2016-04-25 DIAGNOSIS — M961 Postlaminectomy syndrome, not elsewhere classified: Secondary | ICD-10-CM | POA: Insufficient documentation

## 2016-04-25 MED ORDER — LABETALOL HCL 200 MG PO TABS: ORAL_TABLET | ORAL | Status: DC

## 2016-04-25 NOTE — Progress Notes (Signed)
Subjective:    Patient ID: Catherine Gardner, female    DOB: Jun 09, 1961, 55 y.o.   MRN: [id number]  HPI Pool therapy 3 times a week Senior level, working on balance and strengthening.  Pt limited by back pain. Has been diagnosed with osteoporosis and started on Forteo by endocrinology.  Has side effect of Forteo, bilateral anterior leg pain, f/u with endocrinology.  Patient feels like her range of motion is a little bit better now. She is not even sure she has fibromyalgia thinks that she has some type of inflammation in her body. I reviewed her imaging studies as well as lab work. She has a mildly elevated ESR.  Interval workup includes cervical MRI from March 2017 showing no significant compressive lesions some evidence of multilevel degenerative disc. Lumbar spine MRI from January 2017 showed no significant compressive lesions, uncomplicated postoperative findings L5-S1 fusion  Has had spinal cord stimulator placement in the past but last MRI showed this was removed Pain Inventory Average Pain 5 Pain Right Now 5 My pain is constant, dull, tingling, aching and numbness  In the last 24 hours, has pain interfered with the following? General activity 7 Relation with others 9 Enjoyment of life 7 What TIME of day is your pain at its worst? all Sleep (in general) Fair  Pain is worse with: walking, sitting, inactivity and standing Pain improves with: medication Relief from Meds: 4  Mobility walk without assistance how many minutes can you walk? 5-10 ability to climb steps?  yes do you drive?  yes Do you have any goals in this area?  yes  Function I need assistance with the following:  household duties  Neuro/Psych weakness numbness tingling trouble walking spasms  Prior Studies Any changes since last visit?  no  Physicians involved in your care Any changes since last visit?  no   Family History  Problem Relation Age of Onset  . ADD / ADHD Daughter   . Depression  Daughter   . Hyperlipidemia Other   . Alcohol abuse Other     alcholism/addiction  . Crohn's disease Other   . Leukemia Father   . GER disease Mother   . Ovarian cancer Maternal Aunt   . Throat cancer Paternal Uncle    Social History   Social History  . Marital Status: Married    Spouse Name: N/A  . Number of Children: 2  . Years of Education: N/A   Occupational History  . disablied    Social History Main Topics  . Smoking status: Never Smoker   . Smokeless tobacco: Never Used  . Alcohol Use: 0.0 oz/week    0 Standard drinks or equivalent per week     Comment: occ  . Drug Use: No  . Sexual Activity: Not Asked   Other Topics Concern  . None   Social History Narrative   Past Surgical History  Procedure Laterality Date  . Abdominal hysterectomy    . S/p diskectomy  2004    L5  . Lumbar fusion  2008  . S/p right foot morton's neuroma  Jan. 2011  . Implant      neuro spine stimulator   Past Medical History  Diagnosis Date  . SHINGLES 11/09/2009  . VITAMIN D DEFICIENCY 07/28/2009  . HYPERLIPIDEMIA 10/31/2007  . 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 DEPRESSION 10/31/2007  . HYPERTENSION 10/31/2007  . SINUSITIS- ACUTE-NOS 12/20/2009  . ARTHRITIS 10/31/2007  . POLYARTHRALGIA 10/31/2007  . LOW BACK PAIN 10/31/2007  . FIBROMYALGIA 10/31/2007  .  Osteoporosis 10/31/2007  . INSOMNIA 06/02/2010  . RASH-NONVESICULAR 06/02/2010  . Headache(784.0) 07/12/2009  . ANXIETY 12/27/2010  . Chronic lower back pain 10/28/2013  . Arachnoiditis    BP 158/92 mmHg  Pulse 73  Resp 14  SpO2 95%  Opioid Risk Score:   Fall Risk Score:  `1  Depression screen PHQ 2/9  Depression screen [facility] 2/9 09/21/2015 02/16/2015  Decreased Interest 1 0  Down, Depressed, Hopeless 1 0  PHQ - 2 Score 2 0  Altered sleeping 2 -  Tired, decreased energy 2 -  Change in appetite 1 -  Feeling bad or failure about yourself  1 -  Trouble concentrating 1 -  Moving slowly or fidgety/restless 1 -  Suicidal thoughts  1 -  PHQ-9 Score 11 -  Difficult doing work/chores Somewhat difficult -     Review of Systems  Constitutional: Positive for diaphoresis.  Gastrointestinal: Positive for nausea and abdominal pain.  Endocrine:       High blood sugar   All other systems reviewed and are negative.      Objective:   Physical Exam  Constitutional: She appears well-developed and well-nourished.  HENT:  Head: Normocephalic and atraumatic.  Eyes: Conjunctivae and EOM are normal. Pupils are equal, round, and reactive to light.  Neck: Normal range of motion.  Neurological: She displays no atrophy. A sensory deficit is present. She exhibits normal muscle tone. Coordination normal.  Reflex Scores:      Tricep reflexes are 2+ on the right side and 2+ on the left side.      Bicep reflexes are 2+ on the right side and 2+ on the left side.      Brachioradialis reflexes are 2+ on the right side and 2+ on the left side.      Patellar reflexes are 2+ on the right side and 2+ on the left side.      Achilles reflexes are 2+ on the right side and 2+ on the left side. Negative straight leg raising test  Nondermatomal reduction in pinprick sensation below the knee bilaterally. She is able to sense where she is touched but states it doesn't feel sharp  Psychiatric: Her speech is normal. Judgment and thought content normal. Her mood appears anxious. Cognition and memory are normal.  Nursing note and vitals reviewed.  4/18 fibromyalgia tenderpoints       Assessment & Plan:  1. Fibromyalgia syndrome she has responded well to aquatic therapy as expected. I do not see any better explanation for her widespread body pain and migratory symptoms. She certainly does not have any cervical or lumbar spine compression. No weakness or neurologic changes on exam other than some reduction in the acuity of pinprick sensation below the knees bilaterally. If her lower extremity sensory symptoms progress would recommend  EMG/NCV  Continue tramadol ER 100 mg per day for now Continue aquatic therapy, I have encouraged her to get to the next level which is aquatic aerobics.  2. Osteoporosis. We discussed the importance of weightbearing exercise beyond aquatic therapy. She does still climb steps although she has had a chair lift on her stairs installed for her "bad days", encouraged walking.

## 2016-04-25 NOTE — Patient Instructions (Signed)
Acupuncture  Acupuncture is a technique that is used in traditional Chinese medical treatment. Traditional Chinese medicine recognizes more than 2,000 points on the body that connect energy pathways (meridians) through the body. Acupuncture stimulates these points with needles that are inserted through your skin. The goal is to balance the physical, emotional, and mental energy in your body.  This treatment is done by a health care provider who has specialized training (licensed acupuncture practitioner). You may have this treatment for many reasons. For instance, many people have acupuncture to treat long-term or short-term pain. Others have acupuncture to treat conditions such as addiction, headaches, and arthritis. Some have it to help them recover from a stroke. Treatment often requires several acupuncture sessions. You may have acupuncture along with other medical treatments.  LET YOUR HEALTH CARE PROVIDER KNOW ABOUT:  · Any allergies you have.  · All medicines you are taking, including vitamins, herbs, eye drops, creams, and over-the-counter medicines.  · Any blood disorders you have.  · Previous surgeries you have had.  · Any medical conditions you may have.  RISKS AND COMPLICATIONS  Generally, this is a safe procedure. However, problems may occur, including:  · Skin infection.  · Damage to organs or structures beneath the skin.  BEFORE THE PROCEDURE  · Your acupuncture practitioner will ask about your medical history and your symptoms.  · You may have a physical exam.  PROCEDURE  · Your skin will be cleaned with a germ-killing (antiseptic) solution.  · Your acupuncture practitioner will open a new set of germ-free (sterile) needles.  · The needles will be inserted in your skin. They will be left in place for a certain length of time. You may feel slight pain or a tingling sensation.  · Your acupuncture practitioner may apply electrical energy to the needles.  · Your acupuncture practitioner may adjust the  needles in certain ways.  · After your procedure, the acupuncture practitioner will remove the needles, throw them away, and clean your skin.  The exact procedure that you have will depend on your condition and how your acupuncture provider treats it. The procedure may vary among health care providers.  AFTER THE PROCEDURE  · Keep all follow-up visits as directed by your health care provider. This is important.  · Let your acupuncture provider know if you have:    Soreness.    Skin redness or irritation.    Fever.     This information is not intended to replace advice given to you by your health care provider. Make sure you discuss any questions you have with your health care provider.     Document Released: 11/16/2003 Document Revised: 03/30/2015 Document Reviewed: 11/04/2014  Elsevier Interactive Patient Education ©2016 [employer] Inc[employer]

## 2016-04-25 NOTE — Telephone Encounter (Signed)
Requesting refill on labetalol.Sent rx to pharmacy....Raechel Chute/lmb

## 2016-05-16 ENCOUNTER — Encounter: Payer: Self-pay | Admitting: Family Medicine

## 2016-05-16 ENCOUNTER — Ambulatory Visit (INDEPENDENT_AMBULATORY_CARE_PROVIDER_SITE_OTHER): Payer: Medicare Other | Admitting: Family Medicine

## 2016-05-16 ENCOUNTER — Ambulatory Visit (INDEPENDENT_AMBULATORY_CARE_PROVIDER_SITE_OTHER)
Admission: RE | Admit: 2016-05-16 | Discharge: 2016-05-16 | Disposition: A | Discharge status: Home / Self Care | Payer: Medicare Other | Source: Ambulatory Visit | Attending: Family Medicine | Admitting: Family Medicine

## 2016-05-16 VITALS — BP 130/70 | HR 77 | Temp 98.3°F | Resp 12 | Ht 64.0 in | Wt 188.0 lb

## 2016-05-16 DIAGNOSIS — M549 Dorsalgia, unspecified: Secondary | ICD-10-CM

## 2016-05-16 DIAGNOSIS — R9389 Abnormal findings on diagnostic imaging of other specified body structures: Secondary | ICD-10-CM

## 2016-05-16 DIAGNOSIS — R05 Cough: Secondary | ICD-10-CM

## 2016-05-16 DIAGNOSIS — J069 Acute upper respiratory infection, unspecified: Secondary | ICD-10-CM | POA: Diagnosis not present

## 2016-05-16 DIAGNOSIS — M546 Pain in thoracic spine: Secondary | ICD-10-CM

## 2016-05-16 DIAGNOSIS — R059 Cough, unspecified: Secondary | ICD-10-CM

## 2016-05-16 MED ORDER — FLUTICASONE PROPIONATE 50 MCG/ACT NA SUSP: 1.0000 | Freq: Two times a day (BID) | NASAL | Status: DC

## 2016-05-16 MED ORDER — ALBUTEROL SULFATE HFA 108 (90 BASE) MCG/ACT IN AERS: 2.0000 | INHALATION_SPRAY | Freq: Four times a day (QID) | RESPIRATORY_TRACT | Status: DC

## 2016-05-16 MED ORDER — BENZONATATE 100 MG PO CAPS: 200.0000 mg | ORAL_CAPSULE | Freq: Two times a day (BID) | ORAL | Status: DC | PRN

## 2016-05-16 MED ORDER — BENZONATATE 100 MG PO CAPS: 200.0000 mg | ORAL_CAPSULE | Freq: Two times a day (BID) | ORAL | Status: AC | PRN

## 2016-05-16 NOTE — Patient Instructions (Addendum)
A few things to remember from today's visit:   [name] I have seen you today for an acute visit because your primary care provider was not available. Monitor for signs of worsening symptoms and seek immediate medical attention if any concerning/warning symptom as we discussed. If symptoms are not resolved in 1-2 weeks you should schedule a follow up appointment with your doctor, before if symptoms get worse.     1. Upper back pain on left side  - DG Chest 2 View; Future  2. URI, acute  - benzonatate (TESSALON) 100 MG capsule; Take 2 capsules (200 mg total) by mouth 2 (two) times daily as needed for cough.  Dispense: 45 capsule; Refill: 0 - fluticasone (FLONASE) 50 MCG/ACT nasal spray; Place 1 spray into both nostrils 2 (two) times daily.  Dispense: 16 g; Refill: 3  3. Cough  - DG Chest 2 View; Future - benzonatate (TESSALON) 100 MG capsule; Take 2 capsules (200 mg total) by mouth 2 (two) times daily as needed for cough.  Dispense: 45 capsule; Refill: 0  Symptoms seems viral and aggavated by allergies.  viral infections are self-limited and we treat each symptom depending of severity.  Over the counter medications as decongestants and cold medications usually help, they need to be taken with caution if there is a history of high blood pressure or palpitations. Tylenol and/or Ibuprofen also helps with most symptoms (headache, muscle aching, fever,etc) Plenty of fluids. Honey helps with cough. Steam inhalations helps with runny nose, nasal congestion, and may prevent sinus infections. Cough and nasal congestion could last a few days and sometimes weeks. Please follow in not any better in 1-2 weeks or if symptoms get worse.

## 2016-05-16 NOTE — Progress Notes (Signed)
Pre visit review using our clinic review tool, if applicable. No additional management support is needed unless otherwise documented below in the visit note. 

## 2016-05-16 NOTE — Progress Notes (Signed)
HPI:   [name] is a 55 y.o.female here today complaining of 4 days of respiratory symptoms.  Non productive cough and sore throat, both better today. + Nasal congestion, rhinorrhea, and post nasal drainage. Not sure about fever. She has not noted chest pain, dyspnea, or wheezing.  No Hx of recent travel. No sick contact. No known insect bite. + Hx of allergies.  She has not tried OTC medication except for Ibuprofen because afraid of interaction with Vit D and Forteo. Symptoms otherwise stable.  Left upper back pain that started today. She has hx of back pain and myalgias but this one is different, not in the middle but rather on lateral aspect. It is exacerbated by movement and relieved by rest, mild-moderate, no radiated, no numbness,tingling,or burning, and no rash.  She takes tramadol for chronic pain. No Hx of recent injury/trauma.   Denies abdominal pain, nausea, vomiting, changes in bowel habits, or urinary symptoms.    Review of Systems  Constitutional: Positive for fatigue. Negative for chills and appetite change.  HENT: Positive for congestion, postnasal drip, rhinorrhea and sore throat. Negative for ear pain, mouth sores, sinus pressure, trouble swallowing and voice change.   Eyes: Negative for discharge, redness, itching and visual disturbance.  Respiratory: Positive for cough. Negative for shortness of breath and wheezing.   Cardiovascular: Negative for chest pain, palpitations and leg swelling.  Gastrointestinal: Negative for nausea, vomiting, abdominal pain and diarrhea.  Musculoskeletal: Positive for myalgias, back pain and arthralgias. Negative for gait problem.  Skin: Negative for color change and rash.  Allergic/Immunologic: Negative for environmental allergies.  Neurological: Positive for light-headedness. Negative for syncope, facial asymmetry, weakness, numbness and headaches.  Hematological: Negative for adenopathy. Does not  bruise/bleed easily.      Current Outpatient Prescriptions on File Prior to Visit  Medication Sig Dispense Refill  . calcium-vitamin D (OSCAL WITH D) 500-200 MG-UNIT per tablet Take 1 tablet by mouth daily.      . hydrochlorothiazide (MICROZIDE) 12.5 MG capsule TAKE 1 CAPSULE (12.5 MG TOTAL) BY MOUTH DAILY. 90 capsule 1  . labetalol (NORMODYNE) 200 MG tablet TAKE 1 TABLET (200 MG TOTAL) BY MOUTH 2 (TWO) TIMES DAILY. 180 tablet 1  . Multiple Vitamin (MULTIVITAMIN) capsule Take 1 capsule by mouth daily.    . promethazine (PHENERGAN) 25 MG tablet Take 1/2 to 1 tablet per day 30 tablet 2  . ranitidine (ZANTAC) 75 MG tablet Take 75 mg by mouth as needed for heartburn.    . Teriparatide, Recombinant, (FORTEO) 600 MCG/2.4ML SOLN Inject 20 mcg into the skin.    Marland Kitchen traMADol (ULTRAM-ER) 100 MG 24 hr tablet TAKE 1 TABLET EVERY DAY 30 tablet 5  . zolpidem (AMBIEN) 10 MG tablet TAKE 1 TABLET BY MOUTH EVERY NIGHT AT BEDTIME AS NEEDED 30 tablet 5   No current facility-administered medications on file prior to visit.     Past Medical History  Diagnosis Date  . SHINGLES 11/09/2009  . VITAMIN D DEFICIENCY 07/28/2009  . HYPERLIPIDEMIA 10/31/2007  . 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 DEPRESSION 10/31/2007  . HYPERTENSION 10/31/2007  . SINUSITIS- ACUTE-NOS 12/20/2009  . ARTHRITIS 10/31/2007  . POLYARTHRALGIA 10/31/2007  . LOW BACK PAIN 10/31/2007  . FIBROMYALGIA 10/31/2007  . Osteoporosis 10/31/2007  . INSOMNIA 06/02/2010  . RASH-NONVESICULAR 06/02/2010  . Headache(784.0) 07/12/2009  . ANXIETY 12/27/2010  . Chronic lower back pain 10/28/2013  . Arachnoiditis    Allergies  Allergen Reactions  . Etodolac Other (See Comments)  Chest pain  . Meperidine Hcl Hives       . Morphine Hives  . Penicillins Hives       . Dilaudid [Hydromorphone Hcl]   . Effexor [Venlafaxine]     incr anxiety  . Imitrex [Sumatriptan Base]   . Benazepril Other (See Comments)    Hair falls out  . Nickel Rash       .  Simvastatin Other (See Comments)    Join pain and itching    Social History   Social History  . Marital Status: Married    Spouse Name: N/A  . Number of Children: 2  . Years of Education: N/A   Occupational History  . disablied    Social History Main Topics  . Smoking status: Never Smoker   . Smokeless tobacco: Never Used  . Alcohol Use: 0.0 oz/week    0 Standard drinks or equivalent per week     Comment: occ  . Drug Use: No  . Sexual Activity: Not Asked   Other Topics Concern  . None   Social History Narrative    Filed Vitals:   05/16/16 1148  BP: 130/70  Pulse: 77  Temp: 98.3 F (36.8 C)  Resp: 12   Body mass index is 32.25 kg/(m^2).  SpO2 Readings from Last 3 Encounters:  05/16/16 98%  04/25/16 95%  11/18/15 97%     Physical Exam  Constitutional: She is oriented to person, place, and time. She appears well-developed. She does not appear ill. No distress.  HENT:  Head: Atraumatic.  Right Ear: Hearing, tympanic membrane, external ear and ear canal normal.  Left Ear: Hearing, tympanic membrane, external ear and ear canal normal.  Nose: Rhinorrhea present. No mucosal edema. Right sinus exhibits no maxillary sinus tenderness and no frontal sinus tenderness. Left sinus exhibits no maxillary sinus tenderness and no frontal sinus tenderness.  Mouth/Throat: Uvula is midline, oropharynx is clear and moist and mucous membranes are normal.  Hypertrophic turbinates. + Post nasal drainage.  Eyes: Conjunctivae and EOM are normal.  Neck: No tracheal tenderness and no muscular tenderness present. No edema present.  Cardiovascular: Normal rate and regular rhythm.   Respiratory: Effort normal and breath sounds normal. No respiratory distress. She has no wheezes. She has no rhonchi. She has no rales.  Prolonged expiration.  Musculoskeletal:   + tenderness upon palpation of paraspinal muscles, left thoracic. Pain is not elicited with movement on exam table during  examination or upon shoulder ROM.     Lymphadenopathy:       Head (right side): No submandibular adenopathy present.       Head (left side): No submandibular adenopathy present.    She has no cervical adenopathy.  Neurological: She is alert and oriented to person, place, and time. She has normal strength. Gait normal.  Skin: Skin is warm. No rash noted. No erythema.  Psychiatric: Her speech is normal. Her mood appears anxious.  Well groomed, good eye contact.      ASSESSMENT AND PLAN:     Catherine Gardner was seen today for sore throat.  Diagnoses and all orders for this visit:  Upper back pain on left side  She has history of back pain and fibromyalgia, I think this upper back pain could be muscle related. She is concerned about pneumonia, even though lung auscultation is negative for redness chest x-ray was ordered. She can apply local heat and start stretching exercises, recommended following this needed.  -     DG Chest  2 View; Future  URI, acute  Symptoms suggests a viral etiology, I explained patient that symptomatic treatment is usually recommended in this case, so I do not think abx is needed at this time. Instructed to monitor for signs of complications, instructed to check temp, clearly instructed about warning signs. I also explained that cough and nasal congestion can last a few days and sometimes weeks. F/U as needed.    -     Discontinue: benzonatate (TESSALON) 100 MG capsule; Take 2 capsules (200 mg total) by mouth 2 (two) times daily as needed for cough. -     Discontinue: fluticasone (FLONASE) 50 MCG/ACT nasal spray; Place 1 spray into both nostrils 2 (two) times daily. -     albuterol (PROVENTIL HFA;VENTOLIN HFA) 108 (90 Base) MCG/ACT inhaler; Inhale 2 puffs into the lungs every 6 (six) hours. -     fluticasone (FLONASE) 50 MCG/ACT nasal spray; Place 1 spray into both nostrils 2 (two) times daily. -     benzonatate (TESSALON) 100 MG capsule; Take 2 capsules (200  mg total) by mouth 2 (two) times daily as needed for cough.  Cough  Explained that cough is a symptom that is difficult to treat, she can try honey at bedtime. Over-the-counter plain Mucinex might also help, caution with cold medications and decongestants. She has prolonged expiration on auscultation, question of reactive airway, she may benefit from bronchodilator so albuterol inhaler was recommended to use for a week and then as needed.She has used inhalers in the past. Chest x-ray will be obtained today. Instructed about warning signs, follow-up as needed.  -     DG Chest 2 View; Future -     Discontinue: benzonatate (TESSALON) 100 MG capsule; Take 2 capsules (200 mg total) by mouth 2 (two) times daily as needed for cough. -     albuterol (PROVENTIL HFA;VENTOLIN HFA) 108 (90 Base) MCG/ACT inhaler; Inhale 2 puffs into the lungs every 6 (six) hours. -     benzonatate (TESSALON) 100 MG capsule; Take 2 capsules (200 mg total) by mouth 2 (two) times daily as needed for cough.           -She was advised to return or notify a doctor immediately if symptoms worsen or persist or new concerns arise, she voices understanding.       Betty G. [place], MD  [facility]. [place] office.

## 2016-05-17 ENCOUNTER — Telehealth: Payer: Self-pay | Admitting: Internal Medicine

## 2016-05-17 NOTE — Telephone Encounter (Signed)
Pt returning your call concerning xray results, please call back.

## 2016-05-18 ENCOUNTER — Ambulatory Visit (INDEPENDENT_AMBULATORY_CARE_PROVIDER_SITE_OTHER)
Admission: RE | Admit: 2016-05-18 | Discharge: 2016-05-18 | Disposition: A | Discharge status: Home / Self Care | Payer: Medicare Other | Source: Ambulatory Visit | Attending: Family Medicine | Admitting: Family Medicine

## 2016-05-18 ENCOUNTER — Other Ambulatory Visit: Payer: Self-pay | Admitting: Family Medicine

## 2016-05-18 ENCOUNTER — Encounter: Payer: Self-pay | Admitting: Family Medicine

## 2016-05-18 DIAGNOSIS — M549 Dorsalgia, unspecified: Secondary | ICD-10-CM

## 2016-05-18 DIAGNOSIS — M546 Pain in thoracic spine: Secondary | ICD-10-CM | POA: Diagnosis not present

## 2016-05-18 NOTE — Telephone Encounter (Signed)
Returned pt's phone call and informed her of the x-ray results.

## 2016-06-05 ENCOUNTER — Encounter: Payer: Self-pay | Admitting: Vascular Surgery

## 2016-06-06 ENCOUNTER — Ambulatory Visit (INDEPENDENT_AMBULATORY_CARE_PROVIDER_SITE_OTHER): Payer: Medicare Other | Admitting: Vascular Surgery

## 2016-06-06 VITALS — BP 139/95 | HR 76 | Temp 98.3°F | Resp 16 | Ht 64.0 in | Wt 188.0 lb

## 2016-06-06 DIAGNOSIS — I8393 Asymptomatic varicose veins of bilateral lower extremities: Secondary | ICD-10-CM

## 2016-06-06 NOTE — Progress Notes (Signed)
Filed Vitals:   06/06/16 0923 06/06/16 0928  BP: 157/83 139/95  Pulse: 83 76  Temp: 98.3 F (36.8 C)   Resp: 16   Height: 5\' 4"  (1.626 m)   Weight: 188 lb (85.276 kg)   SpO2: 96%

## 2016-06-06 NOTE — Progress Notes (Signed)
Subjective:     Patient ID: Catherine Gardner, female   DOB: 02-12-1961, 55 y.o.   MRN: [id number]  HPI this 55 year old female has been previously evaluated by Dr. early in June 2014 and found to have spider veins in both legs with no evidence of reflux on sono site exam by Dr. early. She states that she has had an increased number of spider veins particularly in the posterior thigh areas over the past several months. She has no history of DVT thrombophlebitis stasis ulcers or bleeding. She does develop some mild swelling in the ankles as the day progresses. She is not able to ambulate long distances because of discomfort in her legs which worsens the more she walks. She complains of burning and itching discomfort in her posterior thighs.  Past Medical History  Diagnosis Date  . SHINGLES 11/09/2009  . VITAMIN D DEFICIENCY 07/28/2009  . HYPERLIPIDEMIA 10/31/2007  . 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 DEPRESSION 10/31/2007  . HYPERTENSION 10/31/2007  . SINUSITIS- ACUTE-NOS 12/20/2009  . ARTHRITIS 10/31/2007  . POLYARTHRALGIA 10/31/2007  . LOW BACK PAIN 10/31/2007  . FIBROMYALGIA 10/31/2007  . Osteoporosis 10/31/2007  . INSOMNIA 06/02/2010  . RASH-NONVESICULAR 06/02/2010  . Headache(784.0) 07/12/2009  . ANXIETY 12/27/2010  . Chronic lower back pain 10/28/2013  . Arachnoiditis     Social History  Substance Use Topics  . Smoking status: Never Smoker   . Smokeless tobacco: Never Used  . Alcohol Use: 0.0 oz/week    0 Standard drinks or equivalent per week     Comment: occ    Family History  Problem Relation Age of Onset  . ADD / ADHD Daughter   . Depression Daughter   . Hyperlipidemia Other   . Alcohol abuse Other     alcholism/addiction  . Crohn's disease Other   . Leukemia Father   . GER disease Mother   . Ovarian cancer Maternal Aunt   . Throat cancer Paternal Uncle     Allergies  Allergen Reactions  . Meperidine Hives  . Morphine Hives  . Penicillins Hives       . Sumatriptan Other  (See Comments)    headache  . Etodolac Other (See Comments)    Chest pain  . Meperidine Hcl Hives       . Dilaudid [Hydromorphone Hcl]   . Effexor [Venlafaxine]     incr anxiety  . Imitrex [Sumatriptan Base]   . Benazepril Other (See Comments)    Hair falls out  . Nickel Rash       . Simvastatin Other (See Comments)    Join pain and itching     Current outpatient prescriptions:  .  calcium-vitamin D (OSCAL WITH D) 500-200 MG-UNIT per tablet, Take 1 tablet by mouth daily.  , Disp: , Rfl:  .  Cholecalciferol (VITAMIN D-3 PO), Take 200 mg by mouth daily., Disp: , Rfl:  .  ergocalciferol (VITAMIN D2) 50000 units capsule, Take 50,000 Units by mouth once a week., Disp: , Rfl:  .  hydrochlorothiazide (MICROZIDE) 12.5 MG capsule, TAKE 1 CAPSULE (12.5 MG TOTAL) BY MOUTH DAILY., Disp: 90 capsule, Rfl: 1 .  labetalol (NORMODYNE) 200 MG tablet, TAKE 1 TABLET (200 MG TOTAL) BY MOUTH 2 (TWO) TIMES DAILY., Disp: 180 tablet, Rfl: 1 .  Multiple Vitamin (MULTIVITAMIN) capsule, Take 1 capsule by mouth daily., Disp: , Rfl:  .  promethazine (PHENERGAN) 25 MG tablet, Take 1/2 to 1 tablet per day, Disp: 30 tablet, Rfl: 2 .  ranitidine (ZANTAC) 75 MG  tablet, Take 75 mg by mouth as needed for heartburn., Disp: , Rfl:  .  Teriparatide, Recombinant, (FORTEO) 600 MCG/2.4ML SOLN, Inject 20 mcg into the skin., Disp: , Rfl:  .  traMADol (ULTRAM-ER) 100 MG 24 hr tablet, TAKE 1 TABLET EVERY DAY, Disp: 30 tablet, Rfl: 5 .  zolpidem (AMBIEN) 10 MG tablet, TAKE 1 TABLET BY MOUTH EVERY NIGHT AT BEDTIME AS NEEDED, Disp: 30 tablet, Rfl: 5 .  albuterol (PROVENTIL HFA;VENTOLIN HFA) 108 (90 Base) MCG/ACT inhaler, Inhale 2 puffs into the lungs every 6 (six) hours., Disp: 1 Inhaler, Rfl: 0 .  fluticasone (FLONASE) 50 MCG/ACT nasal spray, Place 1 spray into both nostrils 2 (two) times daily. (Patient not taking: Reported on 06/06/2016), Disp: 16 g, Rfl: 3  Filed Vitals:   06/06/16 0923 06/06/16 0928  BP: 157/83 139/95   Pulse: 83 76  Temp: 98.3 F (36.8 C)   Resp: 16   Height: 5\' 4"  (1.626 m)   Weight: 188 lb (85.276 kg)   SpO2: 96%     Body mass index is 32.25 kg/(m^2).           Review of Systems denies chest pain, dyspnea on exertion, PND, orthopnea, hemoptysis. Patient does have chronic low back pain. Has history of fibromyalgia.     Objective:   Physical Exam BP 139/95 mmHg  Pulse 76  Temp(Src) 98.3 F (36.8 C)  Resp 16  Ht 5\' 4"  (1.626 m)  Wt 188 lb (85.276 kg)  BMI 32.25 kg/m2  SpO2 96%    Gen.-alert and oriented x3 in no apparent distress HEENT normal for age Lungs no rhonchi or wheezing Cardiovascular regular rhythm no murmurs carotid pulses 3+ palpable no bruits audible Abdomen soft nontender no palpable masses Musculoskeletal free of  major deformities Skin clear -no rashes Neurologic normal Lower extremities 3+ femoral and dorsalis pedis pulses palpable bilaterally with no edema A few spider veins lateral and posterior thighs and popliteal area as well as medial calf. No hyperpigmentation or ulceration or bulging varicosities noted in either lower extremity. Both feet well perfused.  Today I performed a bedside sono site ultrasound exam and examined both great saphenous veins which appeared normal in size with no obvious reflux       Assessment:     Bilateral spider veins with no evidence of gross reflux in great saphenous system History of burning discomfort in both legs likely not related to spider veins Fibromyalgia Chronic low back pain Hypertension    Plan:     Discussed the fact that sclerotherapy could be done but would likely not relieve her leg discomfort She is not interested in proceeding with this which I agree with and she will return to see us on a when necessary basis

## 2016-06-12 ENCOUNTER — Other Ambulatory Visit: Payer: Self-pay | Admitting: Family Medicine

## 2016-06-12 NOTE — Telephone Encounter (Signed)
I sent Rx's for acute symptoms, I think I sent Albuterol inh to pharmacy #1/0.  I believe I recommended to continue following with PCP, so if she wants refills she needs to follow with PCP.  Thanks, BJ

## 2016-08-01 ENCOUNTER — Other Ambulatory Visit: Payer: Self-pay | Admitting: Internal Medicine

## 2016-08-01 NOTE — Telephone Encounter (Signed)
faxed

## 2016-08-01 NOTE — Telephone Encounter (Signed)
Done hardcopy to Corinne  

## 2016-08-03 ENCOUNTER — Other Ambulatory Visit (INDEPENDENT_AMBULATORY_CARE_PROVIDER_SITE_OTHER): Payer: Medicare Other

## 2016-08-03 ENCOUNTER — Ambulatory Visit (INDEPENDENT_AMBULATORY_CARE_PROVIDER_SITE_OTHER): Payer: Medicare Other | Admitting: Internal Medicine

## 2016-08-03 ENCOUNTER — Encounter: Payer: Self-pay | Admitting: Internal Medicine

## 2016-08-03 VITALS — BP 138/80 | HR 68 | Temp 98.0°F | Wt 180.0 lb

## 2016-08-03 DIAGNOSIS — R6889 Other general symptoms and signs: Secondary | ICD-10-CM

## 2016-08-03 DIAGNOSIS — Z1159 Encounter for screening for other viral diseases: Secondary | ICD-10-CM | POA: Diagnosis not present

## 2016-08-03 DIAGNOSIS — I10 Essential (primary) hypertension: Secondary | ICD-10-CM

## 2016-08-03 DIAGNOSIS — Z0001 Encounter for general adult medical examination with abnormal findings: Secondary | ICD-10-CM

## 2016-08-03 DIAGNOSIS — G4700 Insomnia, unspecified: Secondary | ICD-10-CM | POA: Diagnosis not present

## 2016-08-03 DIAGNOSIS — E785 Hyperlipidemia, unspecified: Secondary | ICD-10-CM

## 2016-08-03 LAB — URINALYSIS, ROUTINE W REFLEX MICROSCOPIC
Bilirubin Urine: NEGATIVE
Hgb urine dipstick: NEGATIVE
Ketones, ur: NEGATIVE
Leukocytes, UA: NEGATIVE
Nitrite: NEGATIVE
RBC / HPF: NONE SEEN (ref 0–?)
SPECIFIC GRAVITY, URINE: 1.015 (ref 1.000–1.030)
TOTAL PROTEIN, URINE-UPE24: NEGATIVE
URINE GLUCOSE: NEGATIVE
Urobilinogen, UA: 0.2 (ref 0.0–1.0)
pH: 6 (ref 5.0–8.0)

## 2016-08-03 LAB — HEPATIC FUNCTION PANEL
ALBUMIN: 4.3 g/dL (ref 3.5–5.2)
ALK PHOS: 74 U/L (ref 39–117)
ALT: 16 U/L (ref 0–35)
AST: 17 U/L (ref 0–37)
Bilirubin, Direct: 0.1 mg/dL (ref 0.0–0.3)
TOTAL PROTEIN: 7.6 g/dL (ref 6.0–8.3)
Total Bilirubin: 0.5 mg/dL (ref 0.2–1.2)

## 2016-08-03 LAB — CBC WITH DIFFERENTIAL/PLATELET
BASOS ABS: 0 10*3/uL (ref 0.0–0.1)
Basophils Relative: 0.5 % (ref 0.0–3.0)
Eosinophils Absolute: 0.1 10*3/uL (ref 0.0–0.7)
Eosinophils Relative: 1.4 % (ref 0.0–5.0)
HCT: 37.7 % (ref 36.0–46.0)
HEMOGLOBIN: 13.1 g/dL (ref 12.0–15.0)
LYMPHS ABS: 3.5 10*3/uL (ref 0.7–4.0)
Lymphocytes Relative: 51.3 % — ABNORMAL HIGH (ref 12.0–46.0)
MCHC: 34.8 g/dL (ref 30.0–36.0)
MCV: 88.4 fl (ref 78.0–100.0)
MONOS PCT: 4.1 % (ref 3.0–12.0)
Monocytes Absolute: 0.3 10*3/uL (ref 0.1–1.0)
NEUTROS PCT: 42.7 % — AB (ref 43.0–77.0)
Neutro Abs: 2.9 10*3/uL (ref 1.4–7.7)
Platelets: 251 10*3/uL (ref 150.0–400.0)
RBC: 4.27 Mil/uL (ref 3.87–5.11)
RDW: 13.2 % (ref 11.5–15.5)
WBC: 6.8 10*3/uL (ref 4.0–10.5)

## 2016-08-03 LAB — LIPID PANEL
CHOLESTEROL: 237 mg/dL — AB (ref 0–200)
HDL: 71.6 mg/dL (ref >39.00)
LDL CALC: 152 mg/dL — AB (ref 0–99)
NonHDL: 165.59
TRIGLYCERIDES: 70 mg/dL (ref 0.0–149.0)
Total CHOL/HDL Ratio: 3
VLDL: 14 mg/dL (ref 0.0–40.0)

## 2016-08-03 LAB — BASIC METABOLIC PANEL
BUN: 11 mg/dL (ref 6–23)
CALCIUM: 9.7 mg/dL (ref 8.4–10.5)
CO2: 33 meq/L — AB (ref 19–32)
CREATININE: 0.8 mg/dL (ref 0.40–1.20)
Chloride: 103 mEq/L (ref 96–112)
GFR: 94.86 mL/min (ref >60.00)
GLUCOSE: 109 mg/dL — AB (ref 70–99)
Potassium: 3.9 mEq/L (ref 3.5–5.1)
SODIUM: 141 meq/L (ref 135–145)

## 2016-08-03 LAB — TSH: TSH: 1.45 u[IU]/mL (ref 0.35–4.50)

## 2016-08-03 MED ORDER — ZOLPIDEM TARTRATE 10 MG PO TABS: 10.0000 mg | ORAL_TABLET | Freq: Every evening | ORAL | 5 refills | Status: DC | PRN

## 2016-08-03 MED ORDER — EZETIMIBE 10 MG PO TABS: 10.0000 mg | ORAL_TABLET | Freq: Every day | ORAL | 3 refills | Status: DC

## 2016-08-03 NOTE — Progress Notes (Signed)
Subjective:    Patient ID: Catherine LukesCynthia Gardner, female    DOB: 11-Sep-1961, 55 y.o.   MRN: [id number]  HPI  Here for wellness and f/u;  Overall doing ok;  Pt denies Chest pain, worsening SOB, DOE, wheezing, orthopnea, PND, worsening LE edema, palpitations, dizziness or syncope.  Pt denies neurological change such as new headache, facial or extremity weakness.  Pt denies polydipsia, polyuria, or low sugar symptoms. Pt states overall good compliance with treatment and medications, good tolerability, and has been trying to follow appropriate diet.  No fever, night sweats, wt loss, loss of appetite, or other constitutional symptoms.  Pt states good ability with ADL's, has low fall risk, home safety reviewed and adequate, no other significant changes in hearing or vision, and only occasionally active with exercise. Declines flu shot.    Denies worsening depressive symptoms, suicidal ideation, or panic; has ongoing anxiety, not increased recently.   Has ongoing persistent ? Worsening nightly insomnia, hard to get to sleep most nights.  Has been statin intolerant in the past , but trying to follow low chol diet, willing to try nonstatin for lipids.   Past Medical History:  Diagnosis Date  . 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 ANXIETY 12/27/2010  . Arachnoiditis   . ARTHRITIS 10/31/2007  . Chronic lower back pain 10/28/2013  . DEPRESSION 10/31/2007  . FIBROMYALGIA 10/31/2007  . Headache(784.0) 07/12/2009  . HYPERLIPIDEMIA 10/31/2007  . HYPERTENSION 10/31/2007  . INSOMNIA 06/02/2010  . LOW BACK PAIN 10/31/2007  . Osteoporosis 10/31/2007  . POLYARTHRALGIA 10/31/2007  . RASH-NONVESICULAR 06/02/2010  . SHINGLES 11/09/2009  . SINUSITIS- ACUTE-NOS 12/20/2009  . VITAMIN D DEFICIENCY 07/28/2009   Past Surgical History:  Procedure Laterality Date  . ABDOMINAL HYSTERECTOMY    . implant     neuro spine stimulator  . LUMBAR FUSION  2008  . s/p diskectomy  2004   L5  . s/p right foot morton's neuroma  Jan. 2011    reports that she has never smoked. She has never used smokeless tobacco. She reports that she drinks alcohol. She reports that she does not use drugs. family history includes ADD / ADHD in her daughter; Alcohol abuse in her other; Crohn's disease in her other; Depression in her daughter; GER disease in her mother; Hyperlipidemia in her other; Leukemia in her father; Ovarian cancer in her maternal aunt; Throat cancer in her paternal uncle. Allergies  Allergen Reactions  . Meperidine Hives  . Morphine Hives  . Penicillins Hives       . Sumatriptan Other (See Comments)    headache  . Etodolac Other (See Comments)    Chest pain  . Meperidine Hcl Hives       . Dilaudid [Hydromorphone Hcl]   . Effexor [Venlafaxine]     incr anxiety  . Imitrex [Sumatriptan Base]   . Benazepril Other (See Comments)    Hair falls out  . Nickel Rash       . Simvastatin Other (See Comments)    Join pain and itching   Current Outpatient Prescriptions on File Prior to Visit  Medication Sig Dispense Refill  . Cholecalciferol (VITAMIN D-3 PO) Take 2,000 mg by mouth daily.    . hydrochlorothiazide (MICROZIDE) 12.5 MG capsule TAKE 1 CAPSULE (12.5 MG TOTAL) BY MOUTH DAILY. 90 capsule 1  . labetalol (NORMODYNE) 200 MG tablet TAKE 1 TABLET (200 MG TOTAL) BY MOUTH 2 (TWO) TIMES DAILY. 180 tablet 1  . Multiple Vitamin (MULTIVITAMIN) capsule Take 1 capsule by mouth daily.    .Marland Kitchen  promethazine (PHENERGAN) 25 MG tablet Take 1/2 to 1 tablet per day 30 tablet 2  . ranitidine (ZANTAC) 75 MG tablet Take 75 mg by mouth as needed for heartburn.    . Teriparatide, Recombinant, (FORTEO) 600 MCG/2.4ML SOLN Inject 20 mcg into the skin.    Marland Kitchen traMADol (ULTRAM-ER) 100 MG 24 hr tablet TAKE 1 TABLET EVERY DAY 30 tablet 5  . albuterol (PROVENTIL HFA;VENTOLIN HFA) 108 (90 Base) MCG/ACT inhaler Inhale 2 puffs into the lungs every 6 (six) hours. 1 Inhaler 0  . calcium-vitamin D (OSCAL WITH D) 500-200 MG-UNIT per tablet Take 1 tablet by mouth  daily.      . fluticasone (FLONASE) 50 MCG/ACT nasal spray Place 1 spray into both nostrils 2 (two) times daily. (Patient not taking: Reported on 06/06/2016) 16 g 3   No current facility-administered medications on file prior to visit.    Review of Systems Constitutional: Negative for increased diaphoresis, or other activity, appetite or siginficant weight change other than noted HENT: Negative for worsening hearing loss, ear pain, facial swelling, mouth sores and neck stiffness.   Eyes: Negative for other worsening pain, redness or visual disturbance.  Respiratory: Negative for choking or stridor Cardiovascular: Negative for other chest pain and palpitations.  Gastrointestinal: Negative for worsening diarrhea, blood in stool, or abdominal distention Genitourinary: Negative for hematuria, flank pain or change in urine volume.  Musculoskeletal: Negative for myalgias or other joint complaints.  Skin: Negative for other color change and wound or drainage.  Neurological: Negative for syncope and numbness. other than noted Hematological: Negative for adenopathy. or other swelling Psychiatric/Behavioral: Negative for hallucinations, SI, self-injury, decreased concentration or other worsening agitation.      Objective:   Physical Exam BP 138/80   Pulse 68   Temp 98 F (36.7 C)   Wt 180 lb (81.6 kg)   SpO2 98%   BMI 30.90 kg/m  VS noted,  Constitutional: Pt is oriented to person, place, and time. Appears well-developed and well-nourished, in no significant distress Head: Normocephalic and atraumatic  Eyes: Conjunctivae and EOM are normal. Pupils are equal, round, and reactive to light Right Ear: External ear normal.  Left Ear: External ear normal Nose: Nose normal.  Mouth/Throat: Oropharynx is clear and moist  Neck: Normal range of motion. Neck supple. No JVD present. No tracheal deviation present or significant neck LA or mass Cardiovascular: Normal rate, regular rhythm, normal heart  sounds and intact distal pulses.   Pulmonary/Chest: Effort normal and breath sounds without rales or wheezing  Abdominal: Soft. Bowel sounds are normal. NT. No HSM  Musculoskeletal: Normal range of motion. Exhibits no edema Lymphadenopathy: Has no cervical adenopathy.  Neurological: Pt is alert and oriented to person, place, and time. Pt has normal reflexes. No cranial nerve deficit. Motor grossly intact Skin: Skin is warm and dry. No rash noted or new ulcers Psychiatric:  Has mild nervous mood and affect. Behavior is normal.     Assessment & Plan:

## 2016-08-03 NOTE — Patient Instructions (Signed)
Please make Nurse Visit appt for the flu shot at the scheduling desk if you like  Please take all new medication as prescribed - the zetia 10 mg per day  Please continue all other medications as before, and refills have been done if requested - the ambien  Please have the pharmacy call with any other refills you may need.  Please continue your efforts at being more active, low cholesterol diet, and weight control.  You are otherwise up to date with prevention measures today.  Please keep your appointments with your specialists as you may have planned  Please go to the LAB in the Basement (turn left off the elevator) for the tests to be done today  You will be contacted by phone if any changes need to be made immediately.  Otherwise, you will receive a letter about your results with an explanation, but please check with MyChart first.  Please remember to sign up for MyChart if you have not done so, as this will be important to you in the future with finding out test results, communicating by private email, and scheduling acute appointments online when needed.  Please return in 1 year for your yearly visit, or sooner if needed, with Lab testing done 3-5 days before

## 2016-08-04 LAB — HEPATITIS C ANTIBODY: HCV AB: NEGATIVE

## 2016-08-06 NOTE — Assessment & Plan Note (Signed)

## 2016-08-06 NOTE — Assessment & Plan Note (Addendum)
Statin intolerant, persistent elevated, o/w stable overall by history and exam, recent data reviewed with pt, and pt to start zetia 10 qd,  to f/u any worsening symptoms or concerns Lab Results  Component Value Date   LDLCALC 152 (H) 08/03/2016   In addition to the time spent performing CPE, I spent an additional 25 minutes face to face,in which greater than 50% of this time was spent in counseling and coordination of care for patient's acute illness as documented.

## 2016-08-06 NOTE — Assessment & Plan Note (Signed)
Moderate persistent, for ambien refill,  to f/u any worsening symptoms or concerns

## 2016-08-06 NOTE — Assessment & Plan Note (Signed)
stable overall by history and exam, recent data reviewed with pt, and pt to continue medical treatment as before,  to f/u any worsening symptoms or concerns BP Readings from Last 3 Encounters:  08/03/16 138/80  06/06/16 (!) 139/95  05/16/16 130/70

## 2016-10-26 ENCOUNTER — Encounter: Payer: Medicare Other | Attending: Registered Nurse | Admitting: Registered Nurse

## 2016-10-26 ENCOUNTER — Encounter: Payer: Self-pay | Admitting: Registered Nurse

## 2016-10-26 VITALS — BP 151/92 | HR 70 | Resp 14

## 2016-10-26 DIAGNOSIS — M810 Age-related osteoporosis without current pathological fracture: Secondary | ICD-10-CM | POA: Insufficient documentation

## 2016-10-26 DIAGNOSIS — Z79899 Other long term (current) drug therapy: Secondary | ICD-10-CM | POA: Insufficient documentation

## 2016-10-26 DIAGNOSIS — M961 Postlaminectomy syndrome, not elsewhere classified: Secondary | ICD-10-CM

## 2016-10-26 DIAGNOSIS — M797 Fibromyalgia: Secondary | ICD-10-CM | POA: Diagnosis not present

## 2016-10-26 DIAGNOSIS — G894 Chronic pain syndrome: Secondary | ICD-10-CM

## 2016-10-26 DIAGNOSIS — M5416 Radiculopathy, lumbar region: Secondary | ICD-10-CM | POA: Insufficient documentation

## 2016-10-26 DIAGNOSIS — G8929 Other chronic pain: Secondary | ICD-10-CM | POA: Diagnosis not present

## 2016-10-26 NOTE — Progress Notes (Signed)
Subjective:    Patient ID: Catherine Gardner, female    DOB: [birth year], 55 y.o.   MRN: [id number]  HPI: Catherine Gardner is a 55 year old female who returns for follow up appointment for chronic pain/ She rates her pain 6. Her current exercise regime is walking. She states her pain is not controlled with Tramadol , she has stopped taking the Tramadol.Also states [facility] and medication was destroyed. She brought in Tramadol 50 mg and medication destroyed per office policy.  [facility] on 12/03/2015.  Catherine Gardner very tearful "stating she is tired of being in pain, she has been in pain since she was 55 years old she states". She denies depression or suicidal thoughts. We discussed counseling, [facility] Pamphlet given, states she will think about going to counseling.   We discussed changing her to Tylenol #3, at this time she doesn't want to be prescribed the medication, information given to Catherine Gardner regarding Tylenol #3. She states she had experience  side effects with Hydrocodone and Oxycodone such as nausea and abdominal cramping she states, she's not looking to be prescribed these medications either. We discuss other treatment modalities. Spent over 30 minutes answering questions and discussing counseling.    Pain Inventory Average Pain 5 Pain Right Now 6 My pain is intermittent, burning, dull and tingling  In the last 24 hours, has pain interfered with the following? General activity 6 Relation with others 8 Enjoyment of life 8 What TIME of day is your pain at its worst? varies Sleep (in general) Poor  Pain is worse with: walking, bending, sitting, inactivity, standing, some activites and lying down Pain improves with: nothing Relief from Meds: 0  Mobility walk without assistance ability to climb steps?  yes do you drive?  yes  Function not employed: date last employed  . disabled: date disabled . I need assistance with the following:  household duties and shopping  Neuro/Psych numbness tremor anxiety  Prior Studies Any changes since last visit?  no  Physicians involved in your care Any changes since last visit?  no   Family History  Problem Relation Age of Onset  . Hyperlipidemia Other   . Alcohol abuse Other     alcholism/addiction  . Crohn's disease Other   . Leukemia Father   . GER disease Mother   . Ovarian cancer Maternal Aunt   . Throat cancer Paternal Uncle   . ADD / ADHD Daughter   . Depression Daughter    Social History   Social History  . Marital status: Married    Spouse name: N/A  . Number of children: 2  . Years of education: N/A   Occupational History  . disablied Unemployed   Social History Main Topics  . Smoking status: Never Smoker  . Smokeless tobacco: Never Used  . Alcohol use 0.0 oz/week     Comment: occ  . Drug use: No  . Sexual activity: Not Asked   Other Topics Concern  . None   Social History Narrative  . None   Past Surgical History:  Procedure Laterality Date  . ABDOMINAL HYSTERECTOMY    . implant     neuro spine stimulator  . LUMBAR FUSION  2008  . s/p diskectomy  2004   L5  . s/p right foot morton's neuroma  Jan. 2011   Past Medical History:  Diagnosis Date  .  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 ANXIETY 12/27/2010  . Arachnoiditis   . ARTHRITIS 10/31/2007  . Chronic lower back pain 10/28/2013  . DEPRESSION 10/31/2007  . FIBROMYALGIA 10/31/2007  . Headache(784.0) 07/12/2009  . HYPERLIPIDEMIA 10/31/2007  . HYPERTENSION 10/31/2007  . INSOMNIA 06/02/2010  . LOW BACK PAIN 10/31/2007  . Osteoporosis 10/31/2007  . POLYARTHRALGIA 10/31/2007  . RASH-NONVESICULAR 06/02/2010  . SHINGLES 11/09/2009  . SINUSITIS- ACUTE-NOS 12/20/2009  . VITAMIN D DEFICIENCY 07/28/2009   There were no vitals taken for this visit.  Opioid Risk Score:   Fall Risk Score:  `1  Depression screen PHQ 2/9  Depression  screen [facility] 2/9 08/03/2016 09/21/2015 02/16/2015  Decreased Interest 0 1 0  Down, Depressed, Hopeless 1 1 0  PHQ - 2 Score 1 2 0  Altered sleeping - 2 -  Tired, decreased energy - 2 -  Change in appetite - 1 -  Feeling bad or failure about yourself  - 1 -  Trouble concentrating - 1 -  Moving slowly or fidgety/restless - 1 -  Suicidal thoughts - 1 -  PHQ-9 Score - 11 -  Difficult doing work/chores - Somewhat difficult -    Review of Systems  Constitutional: Negative.   HENT: Negative.   Eyes: Negative.   Respiratory: Negative.   Cardiovascular: Negative.   Gastrointestinal: Negative.   Endocrine: Negative.   Genitourinary: Negative.   Musculoskeletal: Positive for arthralgias and back pain.  Skin: Negative.   Allergic/Immunologic: Negative.   Neurological: Positive for tremors and numbness.  Hematological: Negative.   Psychiatric/Behavioral: The patient is nervous/anxious.   All other systems reviewed and are negative.      Objective:   Physical Exam  Constitutional: She is oriented to person, place, and time. She appears well-developed and well-nourished.  HENT:  Head: Normocephalic and atraumatic.  Neck: Normal range of motion. Neck supple.  Cardiovascular: Normal rate and regular rhythm.   Pulmonary/Chest: Effort normal and breath sounds normal.  Musculoskeletal:  Normal Muscle Bulk and Muscle Testing Reveals:  Upper Extremities: Full ROM and Muscle Strength 5/5 Thoracic and Lumbar Hypersensitivity Lower Extremities: Right Lower Extremity Flexion Produces pain into Right Hip and Left Lower extremity Flexion Produces Pain into her Lumbar Arises from Table with ease Narrow Based Gait  Neurological: She is alert and oriented to person, place, and time.  Skin: Skin is warm and dry.  Psychiatric: She has a normal mood and affect.  Nursing note and vitals reviewed.         Assessment & Plan:  1. Fibromyalgia syndrome: Continue HEP  2. Osteoporosis. Continue HEP    3. Lumbar Radiculopathy: Continue to Monitor Tramadol was discontinued per patient request, she denies T&C at this time. Information given. Instructed to call if she would like to be prescribe T&C #3, she verbalizes understanding.   F/U in 6 months

## 2016-10-26 NOTE — Patient Instructions (Signed)
Acetaminophen; Codeine tablets  What is this medicine? ACETAMINOPHEN; CODEINE (a set a MEE noe fen; KOE deen) is a pain reliever. It is used to treat mild to moderate pain. This medicine may be used for other purposes; ask your health care provider or pharmacist if you have questions. COMMON BRAND NAME(S): Cocet, Cocet Plus, Tylenol with Codeine No.3, Tylenol with Codeine No.4, Vopac What should I tell my health care provider before I take this medicine? They need to know if you have any of these conditions: -brain tumor -Crohn's disease, inflammatory bowel disease, or ulcerative colitis -drug abuse or addiction -head injury -heart or circulation problems -if you often drink alcohol -kidney disease or problems going to the bathroom -liver disease -lung disease, asthma, or breathing problems -an unusual or allergic reaction to acetaminophen, codeine, salicylates, other opioid analgesics, other medicines, foods, dyes, or preservatives -pregnant or trying to get pregnant -breast-feeding How should I use this medicine? Take this medicine by mouth with a full glass of water. Follow the directions on the prescription label. You can take it with or without food. If if upsets your stomach, take the medicine with food. Do not take your medicine more often than directed. A special MedGuide will be given to you by the pharmacist with each prescription and refill. Be sure to read this information carefully each time. Talk to your pediatrician regarding the use of this medicine in children. Special care may be needed. Overdosage: If you think you have taken too much of this medicine contact a poison control center or emergency room at once. NOTE: This medicine is only for you. Do not share this medicine with others. What if I miss a dose? If you miss a dose, take it as soon as you can. If it is almost time for your next dose, take only that dose. Do not take double or extra doses. What may interact  with this medicine? This medicine may interact with the following medications: -alcohol -antihistamines for allergy, cough and cold -antiviral medicines used for HIV or AIDS -atropine -certain antibiotics like erythromycin and clarithromycin -certain medicines for anxiety or sleep -certain medicines for bladder problems like oxybutynin, tolterodine -certain medicines for depression like amitriptyline, fluoxetine, sertraline -certain medicines for fungal infections like ketoconazole and itraconazole -certain medicines for irregular heart beat like amiodarone, propafenone, quinidine -certain medicines for Parkinson's disease like benztropine, trihexyphenidyl -certain medicines for seizures like carbamazepine, phenobarbital, phenytoin, primidone -certain medicines for stomach problems like dicyclomine, hyoscyamine -certain medicines for travel sickness like scopolamine -general anesthetics like halothane, isoflurane, methoxyflurane, propofol -ipratropium -local anesthetics like lidocaine, pramoxine, tetracaine -MAOIs like Carbex, Eldepryl, Marplan, Nardil, and Parnate -medicines that relax muscles for surgery -other medicines with acetaminophen -other narcotic medicines for pain or cough -phenothiazines like chlorpromazine, mesoridazine, prochlorperazine, thioridazine -rifampin This list may not describe all possible interactions. Give your health care provider a list of all the medicines, herbs, non-prescription drugs, or dietary supplements you use. Also tell them if you smoke, drink alcohol, or use illegal drugs. Some items may interact with your medicine. What should I watch for while using this medicine? Tell your doctor or health care professional if your pain does not go away, if it gets worse, or if you have new or a different type of pain. You may develop tolerance to the medication. Tolerance means that you will need a higher dose of the medication for pain relief. Tolerance is  normal and is expected if you take the medicine for a long time. Do  not suddenly stop taking your medicine because you may develop a severe reaction. Your body becomes used to the medicine. This does NOT mean you are addicted. Addiction is a behavior related to getting and using a drug for a non medical reason. If you have pain, you have a medical reason to take pain medicine. Your doctor will tell you how much medicine to take. If your doctor wants you to stop the medicine, the dose will be slowly lowered over time to avoid any side effects. There are different types of narcotic medicines (opiates). If you take more than one type at the same time or if you are taking another medicine that also causes drowsiness, you may have more side effects. Give your health care provider a list of all medicines you use. Your doctor will tell you how much medicine to take. Do not take more medicine than directed. Call emergency for help if you have problems breathing or unusual sleepiness. Do not take other medicines that contain acetaminophen with this medicine. Always read labels carefully. If you have questions, ask your doctor or pharmacist. If you take too much acetaminophen get medical help right away. Too much acetaminophen can be very dangerous and cause liver damage. Even if you do not have symptoms, it is important to get help right away You may get drowsy or dizzy. Do not drive, use machinery, or do anything that needs mental alertness until you know how this medicine affects you. Do not stand or sit up quickly, especially if you are an older patient. This reduces the risk of dizzy or fainting spells. Alcohol may interfere with the effect of this medicine. Avoid alcoholic drinks. The medicine will cause constipation. Try to have a bowel movement at least every 2 to 3 days. If you do not have a bowel movement for 3 days, call your doctor or health care professional. Your mouth may get dry. Chewing sugarless gum  or sucking hard candy, and drinking plenty of water may help. Contact your doctor if the problem does not go away or is severe. Immediately call your physician or get emergency help if you are breast-feeding and your baby is sleepier than usual, is limp, or has difficulty breastfeeding or breathing. Children may be at higher risk for side effects. If your child has slow breathing, noisy breathing, confusion, or unusual sleepiness, stop giving this medicine and get medical help right away. What side effects may I notice from receiving this medicine? Side effects that you should report to your doctor or health care professional as soon as possible: -allergic reactions like skin rash, itching or hives, swelling of the face, lips, or tongue -breathing problems -confusion -redness, blistering, peeling or loosening of the skin, including inside the mouth -signs and symptoms of low blood pressure like dizziness; feeling faint or lightheaded, falls; unusually weak or tired -trouble passing urine or change in the amount of urine -yellowing of the eyes or skin Side effects that usually do not require medical attention (report to your doctor or health care professional if they continue or are bothersome): -constipation -dry mouth -nausea, vomiting -tiredness This list may not describe all possible side effects. Call your doctor for medical advice about side effects. You may report side effects to FDA at 1-800-FDA-1088. Where should I keep my medicine? Keep out of the reach of children. This medicine can be abused. Keep your medicine in a safe place to protect it from theft. Do not share this medicine with anyone. Selling or  giving away this medicine is dangerous and against the law. This medicine may cause accidental overdose and death if it taken by other adults, children, or pets. Mix any unused medicine with a substance like cat litter or coffee grounds. Then throw the medicine away in a sealed container  like a sealed bag or a coffee can with a lid. Do not use the medicine after the expiration date. Store at room temperature between 15 and 30 degrees C (59 and 86 degrees F). NOTE: This sheet is a summary. It may not cover all possible information. If you have questions about this medicine, talk to your doctor, pharmacist, or health care provider.  2017 Elsevier/Gold Standard (2015-08-06 13:44:28)

## 2016-11-13 ENCOUNTER — Encounter: Payer: Self-pay | Admitting: Family Medicine

## 2016-11-13 ENCOUNTER — Ambulatory Visit (INDEPENDENT_AMBULATORY_CARE_PROVIDER_SITE_OTHER): Payer: Medicare Other | Admitting: Family Medicine

## 2016-11-13 VITALS — BP 128/90 | HR 78 | Temp 98.4°F | Wt 178.8 lb

## 2016-11-13 DIAGNOSIS — J0100 Acute maxillary sinusitis, unspecified: Secondary | ICD-10-CM

## 2016-11-13 MED ORDER — DOXYCYCLINE HYCLATE 100 MG PO TABS: 100.0000 mg | ORAL_TABLET | Freq: Two times a day (BID) | ORAL | 0 refills | Status: DC

## 2016-11-13 NOTE — Progress Notes (Signed)
Subjective:    Patient ID: Newt LukesCynthia Boodram, female    DOB: 11-May-1961, [age] y.o.   MRN: [id number]  HPI  Ms. Mayford KnifeWilliams is a 55 year old female who presents today with sinus pressure/pain that has been present for 10 days.  Associated chills, rhinitis with yellow mucous, nonproductive cough that is keeping her up at night, and post nasal drip.  She denies ear pain, vomiting/diarrhea, fever, sweats. She reports that her symptoms were improving however did not clear and is now worsening.  Treatment at home with humidifier and Vicks vaporub, zicam, and dayquil which has provided limited benefit. She denies history of asthma/bronchitis. No history of recent antibiotic use. Recent sick contact exposure with 1416 month old granddaughter. She is not UTD with influenza vaccine  Review of Systems  Constitutional: Positive for chills. Negative for fever.  HENT: Positive for congestion, postnasal drip, rhinorrhea, sinus pain and sinus pressure. Negative for ear pain, sneezing and sore throat.   Eyes: Negative for visual disturbance.  Respiratory: Positive for cough. Negative for shortness of breath and wheezing.   Cardiovascular: Negative for chest pain and palpitations.  Gastrointestinal: Negative for abdominal pain, diarrhea, nausea and vomiting.  Musculoskeletal: Negative for myalgias.  Skin: Negative for rash.   Past Medical History:  Diagnosis Date  . 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 ANXIETY 12/27/2010  . Arachnoiditis   . ARTHRITIS 10/31/2007  . Chronic lower back pain 10/28/2013  . DEPRESSION 10/31/2007  . FIBROMYALGIA 10/31/2007  . Headache(784.0) 07/12/2009  . HYPERLIPIDEMIA 10/31/2007  . HYPERTENSION 10/31/2007  . INSOMNIA 06/02/2010  . LOW BACK PAIN 10/31/2007  . Osteoporosis 10/31/2007  . POLYARTHRALGIA 10/31/2007  . RASH-NONVESICULAR 06/02/2010  . SHINGLES 11/09/2009  . SINUSITIS- ACUTE-NOS 12/20/2009  . VITAMIN D DEFICIENCY 07/28/2009     Social History   Social History  . Marital  status: Married    Spouse name: N/A  . Number of children: 2  . Years of education: N/A   Occupational History  . disablied Unemployed   Social History Main Topics  . Smoking status: Never Smoker  . Smokeless tobacco: Never Used  . Alcohol use 0.0 oz/week     Comment: occ  . Drug use: No  . Sexual activity: Not on file   Other Topics Concern  . Not on file   Social History Narrative  . No narrative on file    Past Surgical History:  Procedure Laterality Date  . ABDOMINAL HYSTERECTOMY    . implant     neuro spine stimulator  . LUMBAR FUSION  2008  . s/p diskectomy  2004   L5  . s/p right foot morton's neuroma  Jan. 2011    Family History  Problem Relation Age of Onset  . Hyperlipidemia Other   . Alcohol abuse Other     alcholism/addiction  . Crohn's disease Other   . Leukemia Father   . GER disease Mother   . Ovarian cancer Maternal Aunt   . Throat cancer Paternal Uncle   . ADD / ADHD Daughter   . Depression Daughter     Allergies  Allergen Reactions  . Meperidine Hives  . Morphine Hives  . Penicillins Hives       . Sumatriptan Other (See Comments)    headache  . Etodolac Other (See Comments)    Chest pain  . Meperidine Hcl Hives       . Dilaudid [Hydromorphone Hcl]   . Effexor [Venlafaxine]     incr anxiety  .  Imitrex [Sumatriptan Base]   . Benazepril Other (See Comments)    Hair falls out  . Nickel Rash       . Simvastatin Other (See Comments)    Join pain and itching    Current Outpatient Prescriptions on File Prior to Visit  Medication Sig Dispense Refill  . calcium-vitamin D (OSCAL WITH D) 500-200 MG-UNIT per tablet Take 1 tablet by mouth daily.      . Cholecalciferol (VITAMIN D-3 PO) Take 2,000 mg by mouth daily.    Marland Kitchen. ezetimibe (ZETIA) 10 MG tablet Take 1 tablet (10 mg total) by mouth daily. 90 tablet 3  . labetalol (NORMODYNE) 200 MG tablet TAKE 1 TABLET (200 MG TOTAL) BY MOUTH 2 (TWO) TIMES DAILY. 180 tablet 1  . Multiple Vitamin  (MULTIVITAMIN) capsule Take 1 capsule by mouth daily.    Marland Kitchen. omeprazole (PRILOSEC) 10 MG capsule Take 20 mg by mouth daily.    . promethazine (PHENERGAN) 25 MG tablet Take 1/2 to 1 tablet per day 30 tablet 2  . Teriparatide, Recombinant, (FORTEO) 600 MCG/2.4ML SOLN Inject 20 mcg into the skin.    Marland Kitchen. topiramate (TOPAMAX) 25 MG tablet Take 25 mg by mouth 2 (two) times daily.    Marland Kitchen. zolpidem (AMBIEN) 10 MG tablet Take 1 tablet (10 mg total) by mouth at bedtime as needed. 30 tablet 5  . fluticasone (FLONASE) 50 MCG/ACT nasal spray Place 1 spray into both nostrils 2 (two) times daily. (Patient not taking: Reported on 11/13/2016) 16 g 3   No current facility-administered medications on file prior to visit.     BP 128/90 (BP Location: Left Arm, Patient Position: Sitting, Cuff Size: Normal)   Pulse 78   Temp 98.4 F (36.9 C) (Oral)   Wt 178 lb 12.8 oz (81.1 kg)   SpO2 98%   BMI 30.69 kg/m       Objective:   Physical Exam  Constitutional: She is oriented to person, place, and time. She appears well-developed and well-nourished.  HENT:  Right Ear: Tympanic membrane normal.  Left Ear: Tympanic membrane normal.  Nose: Rhinorrhea present. Right sinus exhibits maxillary sinus tenderness. Right sinus exhibits no frontal sinus tenderness. Left sinus exhibits maxillary sinus tenderness. Left sinus exhibits no frontal sinus tenderness.  Mouth/Throat: Mucous membranes are normal. No oropharyngeal exudate or posterior oropharyngeal erythema.  Eyes: Pupils are equal, round, and reactive to light. No scleral icterus.  Neck: Neck supple.  Cardiovascular: Normal rate and regular rhythm.   Pulmonary/Chest: Effort normal and breath sounds normal. She has no wheezes. She has no rales.  Lymphadenopathy:    She has cervical adenopathy.  Neurological: She is alert and oriented to person, place, and time.  Skin: Skin is warm and dry. No rash noted.       Assessment & Plan:  1. Acute maxillary sinusitis,  recurrence not specified Duration of symptoms and double sickening presentation; Will treat with doxycycline and advise supportive measures of fluid, Allegra, Claritin, or Zyrtec for post nasal drip and Mucinex DM for cough as needed. - doxycycline (VIBRA-TABS) 100 MG tablet; Take 1 tablet (100 mg total) by mouth 2 (two) times daily.  Dispense: 20 tablet; Refill: 0  Follow up if symptoms do not improve in 3 to 4 days, worsen, or you develop a fever >101.  Roddie McJulia Rashauna Tep, FNP-C

## 2016-11-13 NOTE — Progress Notes (Signed)
Pre visit review using our clinic review tool, if applicable. No additional management support is needed unless otherwise documented below in the visit note. 

## 2016-11-13 NOTE — Patient Instructions (Addendum)
Please take medication as directed and follow up if symptoms do not improve in 3 to 4 days,worsen, or you develop a fever >101. You may also use Mucinex DM for cough and Allegra, Claritin, or Zyrtec for post nasal drip.  Sinusitis, Adult Sinusitis is soreness and inflammation of your sinuses. Sinuses are hollow spaces in the bones around your face. They are located:  Around your eyes.  In the middle of your forehead.  Behind your nose.  In your cheekbones. Your sinuses and nasal passages are lined with a stringy fluid (mucus). Mucus normally drains out of your sinuses. When your nasal tissues get inflamed or swollen, the mucus can get trapped or blocked so air cannot flow through your sinuses. This lets bacteria, viruses, and funguses grow, and that leads to infection. Follow these instructions at home: Medicines  Take, use, or apply over-the-counter and prescription medicines only as told by your doctor. These may include nasal sprays.  If you were prescribed an antibiotic medicine, take it as told by your doctor. Do not stop taking the antibiotic even if you start to feel better. Hydrate and Humidify  Drink enough water to keep your pee (urine) clear or pale yellow.  Use a cool mist humidifier to keep the humidity level in your home above 50%.  Breathe in steam for 10-15 minutes, 3-4 times a day or as told by your doctor. You can do this in the bathroom while a hot shower is running.  Try not to spend time in cool or dry air. Rest  Rest as much as possible.  Sleep with your head raised (elevated).  Make sure to get enough sleep each night. General instructions  Put a warm, moist washcloth on your face 3-4 times a day or as told by your doctor. This will help with discomfort.  Wash your hands often with soap and water. If there is no soap and water, use hand sanitizer.  Do not smoke. Avoid being around people who are smoking (secondhand smoke).  Keep all follow-up visits  as told by your doctor. This is important. Contact a doctor if:  You have a fever.  Your symptoms get worse.  Your symptoms do not get better within 10 days. Get help right away if:  You have a very bad headache.  You cannot stop throwing up (vomiting).  You have pain or swelling around your face or eyes.  You have trouble seeing.  You feel confused.  Your neck is stiff.  You have trouble breathing. This information is not intended to replace advice given to you by your health care provider. Make sure you discuss any questions you have with your health care provider. Document Released: 05/01/2008 Document Revised: 07/09/2016 Document Reviewed: 09/08/2015 Elsevier Interactive Patient Education  2017 [employer]

## 2016-12-12 ENCOUNTER — Other Ambulatory Visit: Payer: Self-pay | Admitting: Internal Medicine

## 2016-12-12 NOTE — Telephone Encounter (Signed)
faxed

## 2016-12-12 NOTE — Telephone Encounter (Signed)
Done hardcopy to Corinne  

## 2016-12-15 ENCOUNTER — Other Ambulatory Visit: Payer: Self-pay | Admitting: Orthopedic Surgery

## 2016-12-15 DIAGNOSIS — G935 Compression of brain: Secondary | ICD-10-CM

## 2016-12-17 ENCOUNTER — Ambulatory Visit
Admission: RE | Admit: 2016-12-17 | Discharge: 2016-12-17 | Disposition: A | Discharge status: Home / Self Care | Payer: Medicare Other | Source: Ambulatory Visit | Attending: Orthopedic Surgery | Admitting: Orthopedic Surgery

## 2016-12-17 DIAGNOSIS — G935 Compression of brain: Secondary | ICD-10-CM

## 2016-12-20 ENCOUNTER — Other Ambulatory Visit: Payer: Self-pay | Admitting: Internal Medicine

## 2016-12-20 DIAGNOSIS — Z1231 Encounter for screening mammogram for malignant neoplasm of breast: Secondary | ICD-10-CM

## 2016-12-25 ENCOUNTER — Ambulatory Visit
Admission: RE | Admit: 2016-12-25 | Discharge: 2016-12-25 | Disposition: A | Discharge status: Home / Self Care | Payer: Medicare Other | Source: Ambulatory Visit | Attending: Internal Medicine | Admitting: Internal Medicine

## 2016-12-25 DIAGNOSIS — M8580 Other specified disorders of bone density and structure, unspecified site: Secondary | ICD-10-CM

## 2017-01-02 DIAGNOSIS — Q048 Other specified congenital malformations of brain: Secondary | ICD-10-CM | POA: Insufficient documentation

## 2017-01-26 ENCOUNTER — Ambulatory Visit
Admission: RE | Admit: 2017-01-26 | Discharge: 2017-01-26 | Disposition: A | Discharge status: Home / Self Care | Payer: Medicare Other | Source: Ambulatory Visit | Attending: Internal Medicine | Admitting: Internal Medicine

## 2017-01-26 DIAGNOSIS — Z1231 Encounter for screening mammogram for malignant neoplasm of breast: Secondary | ICD-10-CM

## 2017-02-07 ENCOUNTER — Other Ambulatory Visit: Payer: Self-pay | Admitting: Internal Medicine

## 2017-02-08 ENCOUNTER — Other Ambulatory Visit: Payer: Self-pay | Admitting: Internal Medicine

## 2017-02-08 NOTE — Telephone Encounter (Signed)
Done hardcopy to Anna  

## 2017-02-08 NOTE — Telephone Encounter (Signed)
Send and faxed done!

## 2017-04-24 ENCOUNTER — Ambulatory Visit: Payer: Medicare Other | Admitting: Physical Medicine & Rehabilitation

## 2017-04-30 ENCOUNTER — Ambulatory Visit (HOSPITAL_BASED_OUTPATIENT_CLINIC_OR_DEPARTMENT_OTHER): Payer: Medicare Other | Admitting: Physical Medicine & Rehabilitation

## 2017-04-30 ENCOUNTER — Encounter: Payer: Self-pay | Admitting: Physical Medicine & Rehabilitation

## 2017-04-30 ENCOUNTER — Encounter: Payer: Medicare Other | Attending: Physical Medicine & Rehabilitation

## 2017-04-30 VITALS — BP 158/96 | HR 73

## 2017-04-30 DIAGNOSIS — M797 Fibromyalgia: Secondary | ICD-10-CM

## 2017-04-30 DIAGNOSIS — Z818 Family history of other mental and behavioral disorders: Secondary | ICD-10-CM | POA: Diagnosis not present

## 2017-04-30 DIAGNOSIS — G8929 Other chronic pain: Secondary | ICD-10-CM | POA: Diagnosis not present

## 2017-04-30 DIAGNOSIS — F338 Other recurrent depressive disorders: Secondary | ICD-10-CM | POA: Diagnosis not present

## 2017-04-30 DIAGNOSIS — Z800 Family history of malignant neoplasm of digestive organs: Secondary | ICD-10-CM | POA: Diagnosis not present

## 2017-04-30 DIAGNOSIS — F418 Other specified anxiety disorders: Secondary | ICD-10-CM

## 2017-04-30 DIAGNOSIS — Z90710 Acquired absence of both cervix and uterus: Secondary | ICD-10-CM | POA: Insufficient documentation

## 2017-04-30 DIAGNOSIS — Z9889 Other specified postprocedural states: Secondary | ICD-10-CM | POA: Diagnosis not present

## 2017-04-30 DIAGNOSIS — Z8249 Family history of ischemic heart disease and other diseases of the circulatory system: Secondary | ICD-10-CM | POA: Diagnosis not present

## 2017-04-30 DIAGNOSIS — Z79899 Other long term (current) drug therapy: Secondary | ICD-10-CM | POA: Diagnosis not present

## 2017-04-30 DIAGNOSIS — Z806 Family history of leukemia: Secondary | ICD-10-CM | POA: Diagnosis not present

## 2017-04-30 DIAGNOSIS — Z8041 Family history of malignant neoplasm of ovary: Secondary | ICD-10-CM | POA: Insufficient documentation

## 2017-04-30 DIAGNOSIS — I10 Essential (primary) hypertension: Secondary | ICD-10-CM | POA: Insufficient documentation

## 2017-04-30 DIAGNOSIS — R200 Anesthesia of skin: Secondary | ICD-10-CM | POA: Insufficient documentation

## 2017-04-30 DIAGNOSIS — Z811 Family history of alcohol abuse and dependence: Secondary | ICD-10-CM | POA: Diagnosis not present

## 2017-04-30 NOTE — Patient Instructions (Addendum)
Referral to Psychology  Non medication pain relief  Try ice alternating with heating pad for 20 minutes every 2 hours as needed  Try TENs unit that can be puchased in a pharmacy for about 40.00, brands include Aleve or Icy Hot  Try various muscle cremes  Aspercreme or others that contain trolamine- this is anti inflammatory  Capsaicin creme which reduces Pain substance P- try .025%  Lidocaine containing creme which has a numbing medicine  Menthol and camphor containing creme which has a cooling effect

## 2017-04-30 NOTE — Progress Notes (Signed)
Subjective:    Patient ID: Catherine Gardner, female    DOB: 1961-04-01, 56 y.o.   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 General activity 7 Relation with others 9 Enjoyment of life 9 What TIME of day is your pain at its worst? all Sleep (in general) Poor  Pain is worse with: . Pain improves with: . Relief from Meds: 2  Mobility walk without assistance  Function disabled: date disabled 2008  Neuro/Psych weakness numbness tremor trouble walking spasms dizziness depression anxiety loss of taste or smell  Prior Studies Any changes since last visit?  no  Physicians involved in your care Any changes since last visit?  no   Family History  Problem Relation Age of Onset  . Hyperlipidemia Other   . Alcohol abuse Other        alcholism/addiction  . Crohn's disease Other   . Leukemia Father   . GER disease Mother   . Ovarian cancer Maternal Aunt   . Throat cancer Paternal Uncle   . ADD / ADHD Daughter   . Depression Daughter    Social History   Social History  . Marital status: Married    Spouse name: N/A  . Number of children: 2  . Years of education: N/A   Occupational History  . disablied Unemployed   Social History Main Topics  . Smoking status: Never Smoker  . Smokeless tobacco: Never Used  . Alcohol use 0.0 oz/week     Comment: occ  . Drug use: No  . Sexual activity: Not on file   Other Topics Concern  . Not on file   Social History Narrative  . No  narrative on file   Past Surgical History:  Procedure Laterality Date  . ABDOMINAL HYSTERECTOMY    . BREAST BIOPSY Left 02/26/2009  . implant     neuro spine stimulator  . LUMBAR FUSION  2008  . s/p diskectomy  2004   L5  . s/p right foot morton's neuroma  Jan. 2011   Past Medical History:  Diagnosis Date  . 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 ANXIETY 12/27/2010  . Arachnoiditis   . ARTHRITIS 10/31/2007  . Chronic lower back pain 10/28/2013  . DEPRESSION 10/31/2007  . FIBROMYALGIA 10/31/2007  . Headache(784.0) 07/12/2009  . HYPERLIPIDEMIA 10/31/2007  . HYPERTENSION 10/31/2007  . INSOMNIA 06/02/2010  . LOW BACK PAIN 10/31/2007  . Osteoporosis 10/31/2007  . POLYARTHRALGIA 10/31/2007  . RASH-NONVESICULAR 06/02/2010  . SHINGLES 11/09/2009  . SINUSITIS- ACUTE-NOS 12/20/2009  . VITAMIN D DEFICIENCY 07/28/2009   There were no vitals taken for this visit.  Opioid Risk Score:   Fall Risk Score:  `1  Depression screen PHQ 2/9  [facility] 08/03/2016 09/21/2015 02/16/2015  Decreased Interest 0 1 0  Down, Depressed, Hopeless 1 1 0  PHQ - 2 Score 1 2 0  Altered sleeping - 2 -  Tired, decreased energy - 2 -  Change in appetite - 1 -  Feeling bad or failure about yourself  - 1 -  Trouble concentrating - 1 -  Moving slowly or fidgety/restless - 1 -  Suicidal thoughts - 1 -  PHQ-9 Score - 11 -  Difficult doing work/chores - Somewhat difficult -    Review of Systems  Constitutional: Negative.   HENT: Negative.   Eyes: Negative.   Respiratory: Negative.   Cardiovascular: Negative.   Gastrointestinal: Positive for abdominal pain.  Endocrine: Negative.   Genitourinary: Negative.   Musculoskeletal: Negative.   Skin: Negative.   Allergic/Immunologic: Negative.   Neurological: Negative.   Hematological: Negative.   Psychiatric/Behavioral: Negative.   All other systems reviewed and are negative.      Objective:   Physical Exam  Constitutional: She is oriented to person,  place, and time. She appears well-developed and well-nourished.  HENT:  Head: Normocephalic and atraumatic.  Eyes: Conjunctivae and EOM are normal. Pupils are equal, round, and reactive to light.  Neck: Normal range of motion.  Musculoskeletal:       Right hip: She exhibits bony tenderness. She exhibits normal range of motion.       Left hip: She exhibits bony tenderness. She exhibits normal range of motion.       Right knee: Tenderness found.       Left knee: Tenderness found.       Cervical back: She exhibits tenderness. She exhibits normal range of motion and no deformity.       Thoracic back: She exhibits tenderness. She exhibits normal range of motion and no deformity.       Lumbar back: She exhibits tenderness. She exhibits normal range of motion and no deformity.  Neurological: She is alert and oriented to person, place, and time. She has normal strength. Gait normal.  Motor strength is 5/5 bilateral deltoid, biceps, triceps, grip, hip flexor, knee extension, ankle dorsi flexors, plantar flexion  Nursing note and vitals reviewed.    Numbness in 3rd and fourth toes     Assessment & Plan:  1. Fibromyalgia syndrome with depression. She has not benefited from the typical fibromyalgia medications. She has poor tolerance of SNRI medications, we will send to neuropsychology, may benefit from SSRI, but given her complexity and multiple medication intolerances may need to send to psychiatry.  Patient has been started on Topamax by another physician, we will increase dosage  Encouraged aquatic exercise class such as [employer]  Return to clinic in 6 months

## 2017-05-03 ENCOUNTER — Encounter: Payer: Self-pay | Admitting: Internal Medicine

## 2017-05-03 ENCOUNTER — Ambulatory Visit (INDEPENDENT_AMBULATORY_CARE_PROVIDER_SITE_OTHER): Payer: Medicare Other | Admitting: Internal Medicine

## 2017-05-03 VITALS — BP 204/100 | HR 77 | Ht 64.0 in | Wt 171.0 lb

## 2017-05-03 DIAGNOSIS — I10 Essential (primary) hypertension: Secondary | ICD-10-CM

## 2017-05-03 MED ORDER — EZETIMIBE 10 MG PO TABS: 10.0000 mg | ORAL_TABLET | Freq: Every day | ORAL | 3 refills | Status: DC

## 2017-05-03 MED ORDER — AMLODIPINE BESYLATE-VALSARTAN 5-320 MG PO TABS: 1.0000 | ORAL_TABLET | Freq: Every day | ORAL | 3 refills | Status: DC

## 2017-05-03 MED ORDER — LABETALOL HCL 200 MG PO TABS: 200.0000 mg | ORAL_TABLET | Freq: Two times a day (BID) | ORAL | 3 refills | Status: AC

## 2017-05-03 NOTE — Patient Instructions (Addendum)
Please take all new medication as prescribed - the generic exforge  Please continue all other medications as before, including the labetolol  Please call on Wed June 13 with your current blood pressure at home; the goal is to be less than 140.90  Please have the pharmacy call with any other refills you may need.  Please continue your efforts at being more active, low cholesterol diet, and weight control  Please keep your appointments with your specialists as you may have planned  Please return in 3 months, or sooner if needed

## 2017-05-03 NOTE — Progress Notes (Signed)
Subjective:    Patient ID: Catherine Gardner, female    DOB: 24-Oct-1961, 55 y.o.   MRN: [id number]  HPI Here to f/u; overall doing ok,  Pt denies chest pain, increasing sob or doe, wheezing, orthopnea, PND, increased LE swelling, palpitations, dizziness or syncope.  Pt denies new neurological symptoms such as new headache, or facial or extremity weakness or numbness.  Pt denies polydipsia, polyuria.   Pt denies new neurological symptoms such as new headache, or facial or extremity weakness or numbness.   Pt states overall fair compliance with meds, sometimes does not take the labetolol twice per day,o/w mostly trying to follow appropriate diet  BP has been elevated recent 150-170's sbp however mult times past few wks at home and at doctor visit. Not ill feeling. Past Medical History:  Diagnosis Date  . 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 ANXIETY 12/27/2010  . Arachnoiditis   . ARTHRITIS 10/31/2007  . Chronic lower back pain 10/28/2013  . DEPRESSION 10/31/2007  . FIBROMYALGIA 10/31/2007  . Headache(784.0) 07/12/2009  . HYPERLIPIDEMIA 10/31/2007  . HYPERTENSION 10/31/2007  . INSOMNIA 06/02/2010  . LOW BACK PAIN 10/31/2007  . Osteoporosis 10/31/2007  . POLYARTHRALGIA 10/31/2007  . RASH-NONVESICULAR 06/02/2010  . SHINGLES 11/09/2009  . SINUSITIS- ACUTE-NOS 12/20/2009  . VITAMIN D DEFICIENCY 07/28/2009   Past Surgical History:  Procedure Laterality Date  . ABDOMINAL HYSTERECTOMY    . BREAST BIOPSY Left 02/26/2009  . implant     neuro spine stimulator  . LUMBAR FUSION  2008  . s/p diskectomy  2004   L5  . s/p right foot morton's neuroma  Jan. 2011    reports that she has never smoked. She has never used smokeless tobacco. She reports that she drinks alcohol. She reports that she does not use drugs. family history includes ADD / ADHD in her daughter; Alcohol abuse in her other; Crohn's disease in her other; Depression in her daughter; GER disease in her mother; Hyperlipidemia in her other; Leukemia in  her father; Ovarian cancer in her maternal aunt; Throat cancer in her paternal uncle. Allergies  Allergen Reactions  . Meperidine Hives  . Morphine Hives  . Penicillins Hives       . Sumatriptan Other (See Comments)    headache  . Etodolac Other (See Comments)    Chest pain  . Meperidine Hcl Hives       . Dilaudid [Hydromorphone Hcl]   . Effexor [Venlafaxine]     incr anxiety  . Imitrex [Sumatriptan Base]   . Benazepril Other (See Comments)    Hair falls out  . Nickel Rash       . Simvastatin Other (See Comments)    Join pain and itching   Current Outpatient Prescriptions on File Prior to Visit  Medication Sig Dispense Refill  . calcium-vitamin D (OSCAL WITH D) 500-200 MG-UNIT per tablet Take 1 tablet by mouth daily.      . Cholecalciferol (VITAMIN D-3 PO) Take 2,000 mg by mouth daily.    . Multiple Vitamin (MULTIVITAMIN) capsule Take 1 capsule by mouth daily.    Marland Kitchen omeprazole (PRILOSEC) 10 MG capsule Take 20 mg by mouth daily.    . Teriparatide, Recombinant, (FORTEO) 600 MCG/2.4ML SOLN Inject 20 mcg into the skin.    Marland Kitchen topiramate (TOPAMAX) 25 MG tablet Take 50 mg by mouth 2 (two) times daily.    Marland Kitchen zolpidem (AMBIEN) 10 MG tablet TAKE 1 TABLET BY MOUTH AT BEDTIME AS NEEDED 30 tablet 5   No current  facility-administered medications on file prior to visit.    Review of Systems All other system neg per pt    Objective:   Physical Exam BP (!) 204/100   Pulse 77   Ht 5\' 4"  (1.626 m)   Wt 171 lb (77.6 kg)   SpO2 100%   BMI 29.35 kg/m  VS noted,  Constitutional: Pt appears in NAD HENT: Head: NCAT.  Right Ear: External ear normal.  Left Ear: External ear normal.  Eyes: . Pupils are equal, round, and reactive to light. Conjunctivae and EOM are normal Nose: without d/c or deformity Neck: Neck supple. Gross normal ROM Cardiovascular: Normal rate and regular rhythm.   Pulmonary/Chest: Effort normal and breath sounds without rales or wheezing.  Neurological: Pt is alert.  At baseline orientation, motor grossly intact Skin: Skin is warm. No rashes, other new lesions, no LE edema Psychiatric: Pt behavior is normal without agitation  No other exam findings Lab Results  Component Value Date   WBC 6.8 08/03/2016   HGB 13.1 08/03/2016   HCT 37.7 08/03/2016   PLT 251.0 08/03/2016   GLUCOSE 109 (H) 08/03/2016   CHOL 237 (H) 08/03/2016   TRIG 70.0 08/03/2016   HDL 71.60 08/03/2016   LDLDIRECT 125.1 10/30/2013   LDLCALC 152 (H) 08/03/2016   ALT 16 08/03/2016   AST 17 08/03/2016   NA 141 08/03/2016   K 3.9 08/03/2016   CL 103 08/03/2016   CREATININE 0.80 08/03/2016   BUN 11 08/03/2016   CO2 33 (H) 08/03/2016   TSH 1.45 08/03/2016       Assessment & Plan:

## 2017-05-03 NOTE — Assessment & Plan Note (Signed)
Mild to mod uncontrolled, to add exforge 5-320, cont all other meds and urged compliance to take all meds as prescribed, f/u bp at home and next visit, to call next wk with results

## 2017-05-09 ENCOUNTER — Emergency Department (HOSPITAL_COMMUNITY): Payer: Medicare Other

## 2017-05-09 ENCOUNTER — Encounter (HOSPITAL_COMMUNITY): Payer: Self-pay | Admitting: *Deleted

## 2017-05-09 ENCOUNTER — Telehealth: Payer: Self-pay | Admitting: Internal Medicine

## 2017-05-09 ENCOUNTER — Emergency Department (HOSPITAL_COMMUNITY)
Admission: EM | Admit: 2017-05-09 | Discharge: 2017-05-09 | Disposition: A | Discharge status: Home / Self Care | Payer: Medicare Other | Attending: Emergency Medicine | Admitting: Emergency Medicine

## 2017-05-09 DIAGNOSIS — R42 Dizziness and giddiness: Secondary | ICD-10-CM | POA: Insufficient documentation

## 2017-05-09 DIAGNOSIS — I10 Essential (primary) hypertension: Secondary | ICD-10-CM | POA: Insufficient documentation

## 2017-05-09 DIAGNOSIS — Z7902 Long term (current) use of antithrombotics/antiplatelets: Secondary | ICD-10-CM | POA: Insufficient documentation

## 2017-05-09 DIAGNOSIS — Z79899 Other long term (current) drug therapy: Secondary | ICD-10-CM | POA: Diagnosis not present

## 2017-05-09 DIAGNOSIS — R55 Syncope and collapse: Secondary | ICD-10-CM | POA: Diagnosis present

## 2017-05-09 LAB — BASIC METABOLIC PANEL
Anion gap: 8 (ref 5–15)
BUN: 15 mg/dL (ref 6–20)
CO2: 25 mmol/L (ref 22–32)
Calcium: 9.9 mg/dL (ref 8.9–10.3)
Chloride: 108 mmol/L (ref 101–111)
Creatinine, Ser: 0.93 mg/dL (ref 0.44–1.00)
Glucose, Bld: 104 mg/dL — ABNORMAL HIGH (ref 65–99)
POTASSIUM: 3.6 mmol/L (ref 3.5–5.1)
SODIUM: 141 mmol/L (ref 135–145)

## 2017-05-09 LAB — URINALYSIS, ROUTINE W REFLEX MICROSCOPIC
Bilirubin Urine: NEGATIVE
Glucose, UA: NEGATIVE mg/dL
Hgb urine dipstick: NEGATIVE
KETONES UR: NEGATIVE mg/dL
LEUKOCYTES UA: NEGATIVE
NITRITE: NEGATIVE
PH: 8 (ref 5.0–8.0)
PROTEIN: NEGATIVE mg/dL
Specific Gravity, Urine: 1.01 (ref 1.005–1.030)

## 2017-05-09 LAB — CBC
HEMATOCRIT: 38.3 % (ref 36.0–46.0)
Hemoglobin: 12.6 g/dL (ref 12.0–15.0)
MCH: 30 pg (ref 26.0–34.0)
MCHC: 32.9 g/dL (ref 30.0–36.0)
MCV: 91.2 fL (ref 78.0–100.0)
PLATELETS: 230 10*3/uL (ref 150–400)
RBC: 4.2 MIL/uL (ref 3.87–5.11)
RDW: 13.2 % (ref 11.5–15.5)
WBC: 5.6 10*3/uL (ref 4.0–10.5)

## 2017-05-09 LAB — I-STAT TROPONIN, ED: Troponin i, poc: 0 ng/mL (ref 0.00–0.08)

## 2017-05-09 MED ORDER — AMLODIPINE BESYLATE-VALSARTAN 5-320 MG PO TABS: 0.5000 | ORAL_TABLET | Freq: Every day | ORAL | 3 refills | Status: DC

## 2017-05-09 NOTE — ED Notes (Signed)
Patient transported to X-ray 

## 2017-05-09 NOTE — ED Provider Notes (Signed)
[facility]-EMERGENCY DEPT Provider Note   CSN: [id number] Arrival date & time: 05/09/17  1153    History   Chief Complaint Chief Complaint  Patient presents with  . Hypertension  . Near Syncope    HPI Catherine Gardner is a 56 y.o. female.  Patient reports a 1 month h/o elevated BPs.  She reports associated chest pressure/ fullness and fluttering.  She notes that on Wed she went to her pain management doctor SBPs 150s and on Thursday it was 204/100 at her PCP's office.  She reports that she was prescribed an additional BP medication (Exforge).  She reports she had been monitoring BPs at home, which have been running 113-142.  She had a drop in her BP.  She notes that she had just eaten breakfast and felt the urge to go the restroom and felt light headed.  She denies LOC/ fall.  She reports that she was somewhat confused during this time (she describes this as delayed response that lasted several seconds).  Denies weakness, numbness, tingling, slurred speech.  She reports that her balance seems to be off more than normal lately.  She reports she has chronic balance issues but it is much more pronounced lately.  She denies dizziness at baseline.  She is not feeling dizzy currently.  She reports she immediately checked her BP and it was 98/69.  She notes that she continues to feel a tugging/ fluttering sensation in the left side of her chest that radiates to her left shoulder.  She has taken her BP medications today.  She has not been ill.  She has been eating, drinking, voiding normally.  She reports SOB (describing it as difficulty taking a deep breath in), which she is experiencing right now.  She is self employed.  No recent travel/ immobilization.  No tobacco use, no drug use.  Occasional social ETOH use.  She reports maternal great grandmother of CVA and maternal grandfather died from unknown arrhythmia.  No close relatives with CAD, CVA.        Past Medical History:  Diagnosis Date  .  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 ANXIETY 12/27/2010  . Arachnoiditis   . ARTHRITIS 10/31/2007  . Chronic lower back pain 10/28/2013  . DEPRESSION 10/31/2007  . FIBROMYALGIA 10/31/2007  . Headache(784.0) 07/12/2009  . HYPERLIPIDEMIA 10/31/2007  . HYPERTENSION 10/31/2007  . INSOMNIA 06/02/2010  . LOW BACK PAIN 10/31/2007  . Osteoporosis 10/31/2007  . POLYARTHRALGIA 10/31/2007  . RASH-NONVESICULAR 06/02/2010  . SHINGLES 11/09/2009  . SINUSITIS- ACUTE-NOS 12/20/2009  . VITAMIN D DEFICIENCY 07/28/2009    Patient Active Problem List   Diagnosis Date Noted  . Spider veins of both lower extremities 06/06/2016  . Postlaminectomy syndrome, lumbar region 09/21/2015  . Lumbar radiculopathy, chronic 09/21/2015  . 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 Memory loss 03/25/2015  . Chronic pain syndrome 03/25/2015  . Memory dysfunction 02/16/2015  . Nonallopathic lesion of cervical region 12/25/2014  . Trigger point of left shoulder region 12/05/2014  . Whiplash injuries 12/04/2014  . Acute hemorrhoid 10/04/2014  . Fractured coccyx (HCC) 10/04/2014  . Hormone replacement therapy 05/01/2014  . Nonallopathic lesion of thoracic region 03/02/2014  . Nonallopathic lesion of lumbosacral region 03/02/2014  . Nonallopathic lesion of sacral region 03/02/2014  . Trapezius muscle spasm 02/23/2014  . Menopausal symptoms 10/28/2013  . ANA positive 10/28/2013  . Chronic lower back pain 10/28/2013  . 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 Varicose veins with pain 05/06/2013  .  Abdominal pain, other specified site 09/16/2012  . Polyarthralgia 09/16/2012  . Palpitations 10/26/2011  . Back pain 10/26/2011  . Dyspepsia and other specified disorders of function of stomach 06/06/2011  . Encounter for well adult exam with abnormal findings 02/27/2011  . GERD (gastroesophageal reflux disease) 02/27/2011  . ANXIETY 12/27/2010  . INSOMNIA 06/02/2010  . RASH-NONVESICULAR 06/02/2010  .  SHINGLES 11/09/2009  . VITAMIN D DEFICIENCY 07/28/2009  . Hyperlipidemia 10/31/2007  . 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 Depression 10/31/2007  . Essential hypertension 10/31/2007  . ARTHRITIS 10/31/2007  . POLYARTHRALGIA 10/31/2007  . LOW BACK PAIN 10/31/2007  . Fibromyalgia 10/31/2007  . Osteoporosis 10/31/2007    Past Surgical History:  Procedure Laterality Date  . ABDOMINAL HYSTERECTOMY    . BREAST BIOPSY Left 02/26/2009  . implant     neuro spine stimulator  . LUMBAR FUSION  2008  . s/p diskectomy  2004   L5  . s/p right foot morton's neuroma  Jan. 2011    OB History    No data available       Home Medications    Prior to Admission medications   Medication Sig Start Date End Date Taking? Authorizing Provider  amLODipine-valsartan (EXFORGE) 5-320 MG tablet Take 1 tablet by mouth daily. 05/03/17   Corwin Levins, MD  calcium-vitamin D (OSCAL WITH D) 500-200 MG-UNIT per tablet Take 1 tablet by mouth daily.      [provider]  Cholecalciferol (VITAMIN D-3 PO) Take 2,000 mg by mouth daily.    [provider]  ezetimibe (ZETIA) 10 MG tablet Take 1 tablet (10 mg total) by mouth daily. 05/03/17   Corwin Levins, MD  labetalol (NORMODYNE) 200 MG tablet Take 1 tablet (200 mg total) by mouth 2 (two) times daily. 05/03/17   Corwin Levins, MD  Multiple Vitamin (MULTIVITAMIN) capsule Take 1 capsule by mouth daily.    [provider]  omeprazole (PRILOSEC) 10 MG capsule Take 20 mg by mouth daily.    [provider]  Teriparatide, Recombinant, (FORTEO) 600 MCG/2.4ML SOLN Inject 20 mcg into the skin.    [provider]  topiramate (TOPAMAX) 25 MG tablet Take 50 mg by mouth 2 (two) times daily.    [provider]  zolpidem (AMBIEN) 10 MG tablet TAKE 1 TABLET BY MOUTH AT BEDTIME AS NEEDED 02/08/17   Corwin Levins, MD    Family History Family History  Problem Relation Age of Onset  . Hyperlipidemia Other   . Alcohol abuse Other         alcholism/addiction  . Crohn's disease Other   . Leukemia Father   . GER disease Mother   . Ovarian cancer Maternal Aunt   . Throat cancer Paternal Uncle   . ADD / ADHD Daughter   . Depression Daughter     Social History Social History  Substance Use Topics  . Smoking status: Never Smoker  . Smokeless tobacco: Never Used  . Alcohol use 0.0 oz/week     Comment: occ     Allergies   Meperidine; Morphine; Penicillins; Sumatriptan; Etodolac; Meperidine hcl; Dilaudid [hydromorphone hcl]; Effexor [venlafaxine]; Imitrex [sumatriptan base]; Benazepril; Nickel; and Simvastatin   Review of Systems Review of Systems  Constitutional: Negative for activity change, appetite change, diaphoresis, fatigue and fever.  HENT: Negative for congestion, rhinorrhea and trouble swallowing.   Eyes: Negative for photophobia and visual disturbance.  Respiratory: Positive for chest tightness and shortness of breath. Negative for cough and wheezing.  Cardiovascular: Positive for palpitations (sensation of fluttering). Negative for chest pain.  Gastrointestinal: Negative for abdominal pain (but notes abdominal fullness in the LUQ), constipation, diarrhea, nausea and vomiting.  Genitourinary: Negative for difficulty urinating.  Musculoskeletal: Positive for back pain (chronic).  Neurological: Positive for dizziness (resolved), speech difficulty (resolved), light-headedness (resolved) and numbness (slight decreased sensation in RUE that is chronic for her). Negative for syncope, facial asymmetry and weakness.       Notes worsening balance problems  Psychiatric/Behavioral: Positive for confusion (resolved).   Physical Exam Updated Vital Signs BP (!) 149/93 (BP Location: Right Arm)   Pulse 77   Temp 98.1 F (36.7 C) (Oral)   Resp 20   SpO2 100%   Physical Exam  Constitutional: She is oriented to person, place, and time. She appears well-developed and well-nourished. No distress.  HENT:  Head:  Normocephalic and atraumatic.  Eyes: Conjunctivae and EOM are normal. Pupils are equal, round, and reactive to light.  Neck: Normal range of motion. Neck supple.  Cardiovascular: Normal rate, regular rhythm, normal heart sounds and intact distal pulses.   No murmur heard. Pulmonary/Chest: Effort normal and breath sounds normal. No respiratory distress.  Abdominal: Soft. Bowel sounds are normal. She exhibits no distension and no mass. There is tenderness (mild discomfort with palpation to epigastric and LUQ areas). There is no rebound and no guarding.  Musculoskeletal: Normal range of motion. She exhibits no edema.  Neurological: She is alert and oriented to person, place, and time. A sensory deficit (mild decrease in light touch sensation to LUE) is present. No cranial nerve deficit.  Skin: Capillary refill takes less than 2 seconds. She is not diaphoretic.  Psychiatric: She has a normal mood and affect. Her behavior is normal. Judgment and thought content normal.     ED Treatments / Results  Labs (all labs ordered are listed, but only abnormal results are displayed) Labs Reviewed  BASIC METABOLIC PANEL - Abnormal; Notable for the following:       Result Value   Glucose, Bld 104 (*)    All other components within normal limits  CBC  URINALYSIS, ROUTINE W REFLEX MICROSCOPIC  CBG MONITORING, ED    EKG  EKG Interpretation None       Radiology Dg Chest 2 View  Result Date: 05/09/2017 CLINICAL DATA:  Hypertension and shortness of Breath EXAM: CHEST  2 VIEW COMPARISON:  05/18/2016 FINDINGS: The heart size and mediastinal contours are within normal limits. Both lungs are clear. The visualized skeletal structures are unremarkable. IMPRESSION: No active cardiopulmonary disease. Electronically Signed   By: Alcide CleverMark  Lukens M.D.   On: 05/09/2017 14:53    Procedures Procedures (including critical care time)  Medications Ordered in ED Medications - No data to display  Orthostatic VS for  the past 24 hrs:  BP- Lying Pulse- Lying BP- Sitting Pulse- Sitting BP- Standing at 0 minutes Pulse- Standing at 0 minutes  05/09/17 1419 133/79 62 142/78 64 154/80 68    Initial Impression / Assessment and Plan / ED Course  I have reviewed the triage vital signs and the nursing notes.  Pertinent labs & imaging results that were available during my care of the patient were reviewed by me and considered in my medical decision making (see chart for details).    1459: Orthostatic vitals negative.  BMP, CBC unremarkable.  EKG NSR.  CXR negative for acute processes.   1523: Discussed care with patient's PCP, who recommends that she be discharged on half tab  of Exforge daily.  His office will contact her for close follow up appt.  1557: iTrop negative.  UA negative for evidence of infection or bleeding.  Final Clinical Impressions(s) / ED Diagnoses   Final diagnoses:  Dizziness   Catherine Gardner is a 56 y.o. female that presented to ED after she had dizziness related to an observed hypotensive episode.  Upon evaluation in ED, her BP was stable.  No hypotension seen.  She had orthostatic vitals performed that were negative.  Labs were unremarkable.  She had a negative CXR.  EKG NSR.  Cardiac monitoring revealed no arrhythmias.  Her symptoms were likely related to new BP medication.  Her care was discussed with her PCP Dr Jonny Ruiz, who recommended that she be discharged on 1/2 tab of her Exforge daily.  He will arrange close outpatient follow up.  Return precautions discussed with patient.  Home care instructions reviewed.  She was discharged in stable condition home.  New Prescriptions New Prescriptions   No medications on file     Raliegh Ip, DO 05/09/17 1557

## 2017-05-09 NOTE — ED Provider Notes (Signed)
I have personally seen and examined the patient. I have reviewed the documentation on PMH/FH/Soc Hx. I have discussed the plan of care with the resident and patient.  I have reviewed and agree with the resident's documentation. Please see associated encounter note.   EKG Interpretation None         Safa Derner, Amadeo GarnetPedro Eduardo, MD 05/09/17 1435

## 2017-05-09 NOTE — ED Triage Notes (Signed)
Pt reports recently having episodes of her sbp elevating > 200. Has been taking meds as prescribed. Reports today had sob and near syncopal episode and her sbp was 90. No acute distress is noted at this time. bp is 149/93.

## 2017-05-09 NOTE — Telephone Encounter (Signed)
Called pt, LVM.   

## 2017-05-09 NOTE — Telephone Encounter (Signed)
These look good, I would not add or change medication if the BP are such as this (less than 140/90); ok to continue same tx

## 2017-05-09 NOTE — ED Notes (Signed)
Patient to xray.

## 2017-05-09 NOTE — Discharge Instructions (Signed)
You were seen in the ED for dizziness associated with a low blood pressure.  You had labs done that were unremarkable.  Your EKG was negative for evidence of ischemia or abnormal heart rhythm.  You had a chest xray that was negative for pneumonia.  Your cardiac enzyme was negative for evidence of heart attack.  I discussed your care with Dr Jonny RuizJohn, who recommends that you cut your Exforge in half.  In other words, take 1/2 tablet daily.  His office will contact you with an appointment, you are recommended to follow up in the next 3 days for Blood pressure check.  Continue monitoring your blood pressures at home.  Stay well hydrated.

## 2017-05-09 NOTE — Telephone Encounter (Signed)
Pt stats Catherine Gardner asked her to log her BP  Yesterday at 6:45 am it was 113/79 at 9am after she did some activities it was 136/79  Today at 8:45 it was 135/79 She is taking her BP sitting down   She would like a call back

## 2017-05-10 MED ORDER — VALSARTAN 320 MG PO TABS: 320.0000 mg | ORAL_TABLET | Freq: Every day | ORAL | 3 refills | Status: DC

## 2017-05-10 NOTE — Telephone Encounter (Signed)
Not sure of the question meaning  The medication was changed from a combination medication (amlodipine and diovan) to diovan only  This should help the situation of overtreatment of the blood pressure

## 2017-05-10 NOTE — Addendum Note (Signed)
Addended by: Corwin LevinsJOHN, Nesreen Albano W on: 05/10/2017 01:22 PM   Modules accepted: Orders

## 2017-05-10 NOTE — Telephone Encounter (Signed)
Called pt, left a detailed msg with below info.

## 2017-05-10 NOTE — Telephone Encounter (Signed)
Pt called back and she did not listen to the message, she would like to know want this medication is going to do.

## 2017-05-10 NOTE — Telephone Encounter (Signed)
Pt returned your call. I gave her MD response.  Pt went to the ER yesterday when her BP dropped to 98/64 and was not feeling well. While at the ER they changed her Amlopipine dosage. When she got home, her BP was 106/68 and this morning it was 119/82.  She has not taken any medication this morning. Pt would like clarification on what exactly she needs to be taking. She is concerned about her BP dropping too low.

## 2017-05-10 NOTE — Telephone Encounter (Addendum)
Ok to change the Exforge 5/320 to Diovan (generic) 320 mg only  Done erx, ok to wait until tomorrow to start

## 2017-05-11 NOTE — Telephone Encounter (Signed)
Pt was informed and expressed understanding.  

## 2017-05-16 ENCOUNTER — Telehealth: Payer: Self-pay | Admitting: Internal Medicine

## 2017-05-16 NOTE — Telephone Encounter (Signed)
05/16/17 - [facility]  for upcoming appointment on 05/18/17 @ 8:40am with Dr. Rennis GoldenHilty. [facility]

## 2017-05-18 ENCOUNTER — Ambulatory Visit (INDEPENDENT_AMBULATORY_CARE_PROVIDER_SITE_OTHER): Payer: Medicare Other | Admitting: Internal Medicine

## 2017-05-18 ENCOUNTER — Encounter: Payer: Self-pay | Admitting: Internal Medicine

## 2017-05-18 VITALS — BP 130/84 | HR 73 | Ht 64.0 in | Wt 168.0 lb

## 2017-05-18 DIAGNOSIS — R0600 Dyspnea, unspecified: Secondary | ICD-10-CM

## 2017-05-18 DIAGNOSIS — R0609 Other forms of dyspnea: Secondary | ICD-10-CM

## 2017-05-18 DIAGNOSIS — R0989 Other specified symptoms and signs involving the circulatory and respiratory systems: Secondary | ICD-10-CM | POA: Diagnosis not present

## 2017-05-18 DIAGNOSIS — R42 Dizziness and giddiness: Secondary | ICD-10-CM

## 2017-05-18 DIAGNOSIS — R55 Syncope and collapse: Secondary | ICD-10-CM | POA: Diagnosis not present

## 2017-05-18 DIAGNOSIS — R002 Palpitations: Secondary | ICD-10-CM

## 2017-05-18 NOTE — Patient Instructions (Addendum)
Your physician has requested that you have an echocardiogram @ [address]. Echocardiography is a painless test that uses sound waves to create images of your heart. It provides your doctor with information about the size and shape of your heart and how well your heart's chambers and valves are working. This procedure takes approximately one hour. There are no restrictions for this procedure. ** please schedule same day ast [facility] appointment if able  Your physician has requested that you have a renal artery duplex @ [address]. During this test, an ultrasound is used to evaluate blood flow to the kidneys. Allow one hour for this exam. Do not eat after midnight the day before and avoid carbonated beverages. Take your medications as you usually do.  Your physician recommends that you schedule a follow-up appointment after testing

## 2017-05-18 NOTE — Progress Notes (Signed)
OFFICE CONSULT NOTE  Chief Complaint:  Palpitations, pre-syncope, labile hypertension  Primary Care Physician: Lorenda Ishihara, MD  HPI:  Catherine Gardner is a 56 y.o. female who is being seen today for the evaluation of palpitations, presyncope and labile hypertension at the request of Corwin Levins, MD. Catherine Gardner is a 56 year old female with a number of medical problems including chronic lower back pain, fibromyalgia, depression, hypertension, dyslipidemia, anxiety, iron deficiency anemia, insomnia and recent palpitations. She was referred by her primary care provider after an episode of labile hypertension which caused her to go to the emergency department. She says recently she's been having some elevated blood pressures for which was noted that her pain management specialist office. Her blood pressure was as high at 1. over 200 systolic and she was placed on amlodipine/valsartan 10. I personally reviewed blood pressure logs that she Which indicated a marked improvement in blood pressure on that medication however then she seemed to become somewhat hypotensive and symptomatic with this. She became presyncopal and presented to the emergency department with palpitations and left sided "chest pain. However, she pointed to the left upper abdominal quadrant. Her new primary care provider further adjusted her medications, stopping her amlodipine/valsartan and and started a diuretic. I reviewed records over the past week which indicate fairly reasonably well-controlled blood pressures however she did have one symptomatic episode of systolic blood pressure less than 100. She is ready been scheduled for a Holter monitor for 48 hours prior PCP on July 2. Again she reported some left upper abdominal pain but no significant chest pain. She denies any worsening shortness of breath or fatigue.  PMHx:  Past  Medical History:  Diagnosis Date  . 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 ANXIETY 12/27/2010  . Arachnoiditis   . ARTHRITIS 10/31/2007  . Chronic lower back pain 10/28/2013  . DEPRESSION 10/31/2007  . FIBROMYALGIA 10/31/2007  . Headache(784.0) 07/12/2009  . HYPERLIPIDEMIA 10/31/2007  . HYPERTENSION 10/31/2007  . INSOMNIA 06/02/2010  . LOW BACK PAIN 10/31/2007  . Osteoporosis 10/31/2007  . POLYARTHRALGIA 10/31/2007  . RASH-NONVESICULAR 06/02/2010  . SHINGLES 11/09/2009  . SINUSITIS- ACUTE-NOS 12/20/2009  . VITAMIN D DEFICIENCY 07/28/2009    Past Surgical History:  Procedure Laterality Date  . ABDOMINAL HYSTERECTOMY    . BREAST BIOPSY Left 02/26/2009  . implant     neuro spine stimulator  . LUMBAR FUSION  2008  . s/p diskectomy  2004   L5  . s/p right foot morton's neuroma  Jan. 2011    FAMHx:  Family History  Problem Relation Age of Onset  . Hyperlipidemia Other   . Alcohol abuse Other        alcholism/addiction  . Crohn's disease Other   . Leukemia Father   . GER disease Mother   . Ovarian cancer Maternal Aunt   . Throat cancer Paternal Uncle   . ADD / ADHD Daughter   . Depression Daughter     SOCHx:   reports that she has never smoked. She has never used smokeless tobacco. She reports that she drinks alcohol. She reports that she does not use drugs.  ALLERGIES:  Allergies  Allergen Reactions  . Meperidine Hives  . Morphine Hives  . Penicillins Hives       . Sumatriptan Other (See Comments)    headache  . Etodolac Other (See Comments)    Chest pain  . Meperidine Hcl Hives       . Dilaudid [Hydromorphone Hcl]   . Effexor [Venlafaxine]  incr anxiety  . Imitrex [Sumatriptan Base]   . Benazepril Other (See Comments)    Hair falls out  . Nickel Rash       . Simvastatin Other (See Comments)    Join pain and itching    ROS: Pertinent items noted in HPI and remainder of comprehensive ROS otherwise negative.  HOME MEDS: Current Outpatient Prescriptions on File  Prior to Visit  Medication Sig Dispense Refill  . calcium-vitamin D (OSCAL WITH D) 500-200 MG-UNIT per tablet Take 1 tablet by mouth daily.      . Cholecalciferol (VITAMIN D-3 PO) Take 2,000 mg by mouth daily.    Marland Kitchen. ezetimibe (ZETIA) 10 MG tablet Take 1 tablet (10 mg total) by mouth daily. 90 tablet 3  . labetalol (NORMODYNE) 200 MG tablet Take 1 tablet (200 mg total) by mouth 2 (two) times daily. 180 tablet 3  . Multiple Vitamin (MULTIVITAMIN) capsule Take 1 capsule by mouth daily.    Marland Kitchen. omeprazole (PRILOSEC) 10 MG capsule Take 20 mg by mouth daily.    . Teriparatide, Recombinant, (FORTEO) 600 MCG/2.4ML SOLN Inject 20 mcg into the skin.    Marland Kitchen. topiramate (TOPAMAX) 25 MG tablet Take 50 mg by mouth 2 (two) times daily.    . valsartan (DIOVAN) 320 MG tablet Take 1 tablet (320 mg total) by mouth daily. 90 tablet 3  . zolpidem (AMBIEN) 10 MG tablet TAKE 1 TABLET BY MOUTH AT BEDTIME AS NEEDED 30 tablet 5   No current facility-administered medications on file prior to visit.     LABS/IMAGING: No results found for this or any previous visit (from the past 48 hour(s)). No results found.  LIPID PANEL:    Component Value Date/Time   CHOL 237 (H) 08/03/2016 1348   TRIG 70.0 08/03/2016 1348   HDL 71.60 08/03/2016 1348   CHOLHDL 3 08/03/2016 1348   VLDL 14.0 08/03/2016 1348   LDLCALC 152 (H) 08/03/2016 1348   LDLDIRECT 125.1 10/30/2013 0934    WEIGHTS: Wt Readings from Last 3 Encounters:  05/18/17 168 lb (76.2 kg)  05/03/17 171 lb (77.6 kg)  11/13/16 178 lb 12.8 oz (81.1 kg)    VITALS: BP 130/84   Pulse 73   Ht 5\' 4"  (1.626 m)   Wt 168 lb (76.2 kg)   BMI 28.84 kg/m   EXAM: General appearance: alert and no distress Neck: no carotid bruit and no JVD Lungs: clear to auscultation bilaterally Heart: regular rate and rhythm Abdomen: soft, non-tender; bowel sounds normal; no masses,  no organomegaly and No renal bruits Extremities: extremities normal, atraumatic, no cyanosis or  edema Pulses: 2+ and symmetric Skin: Skin color, texture, turgor normal. No rashes or lesions Neurologic: Mental statuSignificant myofascial pain of the left neck and anterior chestAlert, oriented, thought content appropriate, Significant myofascial pain of the left neck and anterior chest Psych: Pleasant  EKG: Normal sinus rhythm at 73 - personally reviewed  ASSESSMENT: 1. Labile hypertension 2. Fibromyalgia 3. Possible autonomic dysfunction 4. Palpitations 5. Atypical chest/upper abdominal quadrant pain  PLAN: 1.   Mrs. Mayford KnifeWilliams has had labile blood pressures recently without clear etiology. She does have a history of fibromyalgia with chronic pain for a number of years and intolerance to number of medications. Recently she had symptomatic hypotension. I'm concerned about her labile hypertension which could be related to autonomic dysfunction or perhaps a renovascular problem. I recommend bilateral renal artery Dopplers. We'll also obtain an echocardiogram as she was presyncopal to rule out any type of cardiomyopathy.  She's not describing anginal type chest pain rather left upper abdominal quadrant pain. Exam regarding this was benign today. She does have myofascial pain particularly in the left neck. With regards her palpitations a monitor was ordered by her PCP for 48 hours and I'll follow up on those results. At this time it would be best to allow her blood pressure run higher than low since she seems to be more particularly symptomatic with low blood pressure.  Thanks for the kind referral. I will be in touch with the results of her studies shortly.  Chrystie Nose, MD, [facility]  [place]  [facility]  Attending Cardiologist  Direct Dial: [phone]  Fax: [phone]  Website:  [email or web address] 05/18/2017, 5:42 PM

## 2017-05-21 ENCOUNTER — Telehealth: Payer: Self-pay | Admitting: *Deleted

## 2017-05-21 NOTE — Telephone Encounter (Signed)
Left message for patient to call to reschedule Echo on 7/518

## 2017-05-31 ENCOUNTER — Other Ambulatory Visit (HOSPITAL_COMMUNITY): Payer: Medicare Other

## 2017-05-31 ENCOUNTER — Ambulatory Visit (INDEPENDENT_AMBULATORY_CARE_PROVIDER_SITE_OTHER): Payer: Medicare Other

## 2017-05-31 DIAGNOSIS — R002 Palpitations: Secondary | ICD-10-CM | POA: Diagnosis not present

## 2017-06-01 ENCOUNTER — Other Ambulatory Visit: Payer: Self-pay

## 2017-06-01 ENCOUNTER — Ambulatory Visit (HOSPITAL_COMMUNITY): Payer: Medicare Other | Attending: Cardiovascular Disease

## 2017-06-01 DIAGNOSIS — E785 Hyperlipidemia, unspecified: Secondary | ICD-10-CM | POA: Insufficient documentation

## 2017-06-01 DIAGNOSIS — R0600 Dyspnea, unspecified: Secondary | ICD-10-CM

## 2017-06-01 DIAGNOSIS — R0609 Other forms of dyspnea: Secondary | ICD-10-CM | POA: Insufficient documentation

## 2017-06-01 DIAGNOSIS — R55 Syncope and collapse: Secondary | ICD-10-CM | POA: Diagnosis not present

## 2017-06-01 DIAGNOSIS — I10 Essential (primary) hypertension: Secondary | ICD-10-CM | POA: Diagnosis not present

## 2017-06-01 DIAGNOSIS — R42 Dizziness and giddiness: Secondary | ICD-10-CM | POA: Diagnosis not present

## 2017-06-14 ENCOUNTER — Encounter: Payer: Medicare Other | Admitting: Psychology

## 2017-06-15 ENCOUNTER — Ambulatory Visit (HOSPITAL_COMMUNITY)
Admission: RE | Admit: 2017-06-15 | Discharge: 2017-06-15 | Disposition: A | Discharge status: Home / Self Care | Payer: Medicare Other | Source: Ambulatory Visit | Attending: Cardiovascular Disease | Admitting: Cardiovascular Disease

## 2017-06-15 DIAGNOSIS — I10 Essential (primary) hypertension: Secondary | ICD-10-CM | POA: Diagnosis not present

## 2017-06-15 DIAGNOSIS — R0989 Other specified symptoms and signs involving the circulatory and respiratory systems: Secondary | ICD-10-CM | POA: Diagnosis not present

## 2017-06-21 ENCOUNTER — Ambulatory Visit: Payer: Medicare Other | Admitting: Internal Medicine

## 2017-07-03 ENCOUNTER — Other Ambulatory Visit: Payer: Self-pay | Admitting: Endocrinology

## 2017-07-03 DIAGNOSIS — M810 Age-related osteoporosis without current pathological fracture: Secondary | ICD-10-CM

## 2017-07-12 ENCOUNTER — Encounter: Payer: Medicare Other | Admitting: Psychology

## 2017-07-23 ENCOUNTER — Encounter: Payer: Self-pay | Admitting: Internal Medicine

## 2017-07-23 ENCOUNTER — Ambulatory Visit (INDEPENDENT_AMBULATORY_CARE_PROVIDER_SITE_OTHER): Payer: Medicare Other | Admitting: Internal Medicine

## 2017-07-23 VITALS — BP 114/72 | HR 73 | Ht 64.5 in | Wt 157.8 lb

## 2017-07-23 DIAGNOSIS — R0989 Other specified symptoms and signs involving the circulatory and respiratory systems: Secondary | ICD-10-CM

## 2017-07-23 DIAGNOSIS — R0609 Other forms of dyspnea: Secondary | ICD-10-CM | POA: Diagnosis not present

## 2017-07-23 DIAGNOSIS — R002 Palpitations: Secondary | ICD-10-CM | POA: Diagnosis not present

## 2017-07-23 DIAGNOSIS — R0600 Dyspnea, unspecified: Secondary | ICD-10-CM

## 2017-07-23 NOTE — Patient Instructions (Signed)
Your physician recommends that you schedule a follow-up appointment as needed  

## 2017-07-23 NOTE — Progress Notes (Signed)
OFFICE CONSULT NOTE  Chief Complaint:  Follow-up studies  Primary Care Physician: Lorenda Ishihara, MD  HPI:  Catherine Gardner is a 56 y.o. female who is being seen today for the evaluation of palpitations, presyncope and labile hypertension at the request of Lorenda Ishihara,*. [name] is a 56 year old female with a number of medical problems including chronic lower back pain, fibromyalgia, depression, hypertension, dyslipidemia, anxiety, iron deficiency anemia, insomnia and recent palpitations. She was referred by her primary care provider after an episode of labile hypertension which caused her to go to the emergency department. She says recently she's been having some elevated blood pressures for which was noted that her pain management specialist office. Her blood pressure was as high at 1. over 200 systolic and she was placed on amlodipine/valsartan 10. I personally reviewed blood pressure logs that she Which indicated a marked improvement in blood pressure on that medication however then she seemed to become somewhat hypotensive and symptomatic with this. She became presyncopal and presented to the emergency department with palpitations and left sided "chest pain. However, she pointed to the left upper abdominal quadrant. Her new primary care provider further adjusted her medications, stopping her amlodipine/valsartan and and started a diuretic. I reviewed records over the past week which indicate fairly reasonably well-controlled blood pressures however she did have one symptomatic episode of systolic blood pressure less than 100. She is ready been scheduled for a Holter monitor for 48 hours prior PCP on July 2. Again she reported some left upper abdominal pain but no significant chest pain. She denies any worsening shortness of breath or fatigue.  07/23/2017  Mrs. Waag was seen  today in follow-up. She underwent a number of studies for labile hypertension, chest wall discomfort and palpitations. She wore monitor which demonstrated very infrequent PACs and PVCs. She noted that she had symptoms that were not associated with these extrasystoles therefore I do not believe they are causing her to have symptomatic palpitations. Blood pressure lability has improved. She says this is mostly related to strict diet and no recent adjustment in her medications. She also had an echo which showed normal systolic function and unusually grade 2 diastolic dysfunction. Left atrial size however was normal and I'm not suspicious this is due to hypertensive heart disease although it's possible. Renal artery Dopplers were negative for renal artery stenosis. Overall she feels better but still gets short of breath when she "fusses at her husband" or talks too fast, and with exercise, but quickly improves with resting. She denies any angina.  PMHx:  Past Medical History:  Diagnosis Date  . 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 ANXIETY 12/27/2010  . Arachnoiditis   . ARTHRITIS 10/31/2007  . Chronic lower back pain 10/28/2013  . DEPRESSION 10/31/2007  . FIBROMYALGIA 10/31/2007  . Headache(784.0) 07/12/2009  . HYPERLIPIDEMIA 10/31/2007  . HYPERTENSION 10/31/2007  . INSOMNIA 06/02/2010  . LOW BACK PAIN 10/31/2007  . Osteoporosis 10/31/2007  . POLYARTHRALGIA 10/31/2007  . RASH-NONVESICULAR 06/02/2010  . SHINGLES 11/09/2009  . SINUSITIS- ACUTE-NOS 12/20/2009  . VITAMIN D DEFICIENCY 07/28/2009    Past Surgical History:  Procedure Laterality Date  . ABDOMINAL HYSTERECTOMY    . BREAST BIOPSY Left 02/26/2009  . implant     neuro spine stimulator  . LUMBAR FUSION  2008  . s/p diskectomy  2004   L5  . s/p right foot morton's neuroma  Jan. 2011    FAMHx:  Family History  Problem Relation Age of Onset  . Hyperlipidemia Other   .  Alcohol abuse Other        alcholism/addiction  . Crohn's disease Other   .  Leukemia Father   . GER disease Mother   . Ovarian cancer Maternal Aunt   . Throat cancer Paternal Uncle   . ADD / ADHD Daughter   . Depression Daughter     SOCHx:   reports that she has never smoked. She has never used smokeless tobacco. She reports that she drinks alcohol. She reports that she does not use drugs.  ALLERGIES:  Allergies  Allergen Reactions  . Meperidine Hives  . Morphine Hives  . Penicillins Hives       . Sumatriptan Other (See Comments)    headache  . Etodolac Other (See Comments)    Chest pain  . Meperidine Hcl Hives       . Dilaudid [Hydromorphone Hcl]   . Effexor [Venlafaxine]     incr anxiety  . Imitrex [Sumatriptan Base]   . Benazepril Other (See Comments)    Hair falls out  . Nickel Rash       . Simvastatin Other (See Comments)    Join pain and itching    ROS: Pertinent items noted in HPI and remainder of comprehensive ROS otherwise negative.  HOME MEDS: Current Outpatient Prescriptions on File Prior to Visit  Medication Sig Dispense Refill  . calcium-vitamin D (OSCAL WITH D) 500-200 MG-UNIT per tablet Take 1 tablet by mouth daily.      . Cholecalciferol (VITAMIN D-3 PO) Take 2,000 mg by mouth daily.    Marland Kitchen ezetimibe (ZETIA) 10 MG tablet Take 1 tablet (10 mg total) by mouth daily. 90 tablet 3  . hydrochlorothiazide (HYDRODIURIL) 12.5 MG tablet Take 12.5 mg by mouth daily.    Marland Kitchen labetalol (NORMODYNE) 200 MG tablet Take 1 tablet (200 mg total) by mouth 2 (two) times daily. 180 tablet 3  . Multiple Vitamin (MULTIVITAMIN) capsule Take 1 capsule by mouth daily.    Marland Kitchen omeprazole (PRILOSEC) 10 MG capsule Take 20 mg by mouth daily.    . Teriparatide, Recombinant, (FORTEO) 600 MCG/2.4ML SOLN Inject 20 mcg into the skin.    Marland Kitchen topiramate (TOPAMAX) 25 MG tablet Take 50 mg by mouth 2 (two) times daily.    . valsartan (DIOVAN) 320 MG tablet Take 1 tablet (320 mg total) by mouth daily. 90 tablet 3  . zolpidem (AMBIEN) 10 MG tablet TAKE 1 TABLET BY MOUTH AT  BEDTIME AS NEEDED 30 tablet 5   No current facility-administered medications on file prior to visit.     LABS/IMAGING: No results found for this or any previous visit (from the past 48 hour(s)). No results found.  LIPID PANEL:    Component Value Date/Time   CHOL 237 (H) 08/03/2016 1348   TRIG 70.0 08/03/2016 1348   HDL 71.60 08/03/2016 1348   CHOLHDL 3 08/03/2016 1348   VLDL 14.0 08/03/2016 1348   LDLCALC 152 (H) 08/03/2016 1348   LDLDIRECT 125.1 10/30/2013 0934    WEIGHTS: Wt Readings from Last 3 Encounters:  07/23/17 157 lb 12.8 oz (71.6 kg)  05/18/17 168 lb (76.2 kg)  05/03/17 171 lb (77.6 kg)    VITALS: BP 114/72   Pulse 73   Ht 5' 4.5" (1.638 m)   Wt 157 lb 12.8 oz (71.6 kg)   SpO2 97%   BMI 26.67 kg/m   EXAM: Deferred  EKG: Deferred  ASSESSMENT: 1. Labile hypertension - negative renal artery Dopplers 2. Fibromyalgia 3. Possible autonomic dysfunction 4. Palpitations -  occasional PACs and PVCs 5. Atypical chest/upper abdominal quadrant pain 6. Echo demonstrated LVEF 60-65% with grade 2 diastolic dysfunction  PLAN: 1.   Mrs. Mcconathy has had improvement in her symptoms including improvement palpitations, the lability of her blood pressure and her shortness of breath to some extent although she still gets it. Her monitor was unrevealing and a sensitivity showed very infrequent extrasystoles which did not correlate with her palpitations. Renal Dopplers were negative. The lability of her hypertension has improved. She's restricted salt and watch her diet. She does have a grade 2 diastolic dysfunction which is out of proportion to other cardiac findings. He could explain her shortness of breath with exertion. I don't feel this is ischemic cause however she noted any worsening symptoms with exercise I encouraged her to contact me and we may consider stress testing. For now I would continue with her excellent dietary changes and exercise and I'm happy see her back on  an as-needed basis.  Thanks for allowing me to participate in her care.  Chrystie Nose, MD, [facility]  [place]  [facility]  Attending Cardiologist  Direct Dial: [phone]  Fax: [phone]  Website:  [email or web address] 07/23/2017, 1:07 PM

## 2017-08-02 ENCOUNTER — Ambulatory Visit: Payer: Medicare Other | Admitting: Internal Medicine

## 2017-11-01 ENCOUNTER — Ambulatory Visit: Payer: Medicare Other | Admitting: Physical Medicine & Rehabilitation

## 2017-11-08 ENCOUNTER — Ambulatory Visit: Payer: Medicare Other | Admitting: Physical Medicine & Rehabilitation

## 2017-11-30 ENCOUNTER — Telehealth: Payer: Self-pay | Admitting: Internal Medicine
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
## 2018-01-08 ENCOUNTER — Ambulatory Visit
Admission: RE | Admit: 2018-01-08 | Discharge: 2018-01-08 | Disposition: A | Discharge status: Home / Self Care | Payer: Medicare Other | Source: Ambulatory Visit | Attending: Endocrinology | Admitting: Endocrinology

## 2018-01-08 DIAGNOSIS — M810 Age-related osteoporosis without current pathological fracture: Secondary | ICD-10-CM

## 2018-03-19 ENCOUNTER — Other Ambulatory Visit: Payer: Self-pay | Admitting: Internal Medicine

## 2018-03-19 DIAGNOSIS — Z1231 Encounter for screening mammogram for malignant neoplasm of breast: Secondary | ICD-10-CM

## 2018-04-09 ENCOUNTER — Ambulatory Visit: Payer: Medicare Other

## 2018-04-25 ENCOUNTER — Ambulatory Visit
Admission: RE | Admit: 2018-04-25 | Discharge: 2018-04-25 | Disposition: A | Discharge status: Home / Self Care | Payer: Medicare Other | Source: Ambulatory Visit | Attending: Internal Medicine | Admitting: Internal Medicine

## 2018-04-25 DIAGNOSIS — Z1231 Encounter for screening mammogram for malignant neoplasm of breast: Secondary | ICD-10-CM

## 2019-02-20 ENCOUNTER — Encounter: Payer: Self-pay | Admitting: Gastroenterology

## 2019-04-07 ENCOUNTER — Other Ambulatory Visit: Payer: Self-pay | Admitting: Internal Medicine

## 2019-04-07 DIAGNOSIS — Z1231 Encounter for screening mammogram for malignant neoplasm of breast: Secondary | ICD-10-CM

## 2019-04-28 ENCOUNTER — Ambulatory Visit: Payer: Medicare Other | Admitting: Orthopaedic Surgery

## 2019-04-30 ENCOUNTER — Ambulatory Visit: Payer: Medicare Other | Admitting: Orthopaedic Surgery

## 2019-04-30 ENCOUNTER — Encounter: Payer: Self-pay | Admitting: Orthopaedic Surgery

## 2019-04-30 ENCOUNTER — Ambulatory Visit: Payer: Self-pay

## 2019-04-30 ENCOUNTER — Other Ambulatory Visit: Payer: Self-pay

## 2019-04-30 DIAGNOSIS — M25552 Pain in left hip: Secondary | ICD-10-CM

## 2019-04-30 NOTE — Progress Notes (Signed)
Subjective: Patient is here for ultrasound-guided intra-articular left hip injection.  She has been struggling with pain after falling down some stairs.  She has pain in the posterior and anterior hip.  She gets occasional popping sensation as well.  X-rays showed minimal arthritic change in her hip.  Objective: She has good range of motion with passive internal and external rotation.  She has more pain with external rotation than internal.  She is tender to palpation near the adductor tendons.  No tenderness over the greater trochanter.  Procedure: Ultrasound-guided diagnostic/therapeutic left hip injection: After sterile prep with Betadine, injected 8 cc 1% lidocaine without epinephrine and 40 mg methylprednisolone using a 22-gauge spinal needle, passing the needle through the iliofemoral ligament into the femoral head/neck junction.  Injectate was seen filling the joint capsule.  She had very good pain relief during the immediate anesthetic phase.  She will follow-up with Dr. Magnus Ivan as scheduled.
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
## 2019-05-01 ENCOUNTER — Telehealth: Payer: Self-pay | Admitting: Orthopaedic Surgery

## 2019-05-01 NOTE — Telephone Encounter (Signed)
CD is made and ready for pick up in the box at front. Patient also requested to have notes from yesterdays visit as well.  States she left voicemail but had not received a call back yet about the notes. Can you do this today?

## 2019-05-01 NOTE — Telephone Encounter (Signed)
Can we do this ?

## 2019-05-01 NOTE — Telephone Encounter (Signed)
Patient called requesting a copy of her xrays she had with Dr. Magnus Ivan on yesterday, 04/30/19.  Patient is requesting to pick up the x-rays today if possible so she can bring them to her doctor's appointment at [facility] tomorrow.  Please advise.

## 2019-05-01 NOTE — Telephone Encounter (Signed)
I did return call and lmvm [id number],that she need to sign release form

## 2019-05-14 ENCOUNTER — Other Ambulatory Visit: Payer: Self-pay

## 2019-05-14 ENCOUNTER — Encounter: Payer: Self-pay | Admitting: Physician Assistant

## 2019-05-14 ENCOUNTER — Ambulatory Visit (INDEPENDENT_AMBULATORY_CARE_PROVIDER_SITE_OTHER): Payer: Medicare Other | Admitting: Physician Assistant

## 2019-05-14 DIAGNOSIS — M25552 Pain in left hip: Secondary | ICD-10-CM

## 2019-05-14 NOTE — Progress Notes (Signed)
Office Visit Note   Patient: Catherine Gardner           Date of Birth: [birth year]           MRN: [id number] Visit Date: 05/14/2019              Requested by: Leeroy Cha, MD [address] [place],  [place] [address] PCP: Leeroy Cha, MD   Assessment & Plan: Visit Diagnoses:  1. Pain in left hip     Plan: We will obtain an MRI of her left hip to evaluate the cartilage.  Need to better assess her hip cartilage for arthritic changes versus edema to guide in her treatment.  Perform an open MRI due to the fact patient's claustrophobic.  Should follow-up after the MRI to go over the results and discuss further treatment  Follow-Up Instructions: Return for After MRI.   Orders:  No orders of the defined types were placed in this encounter.  No orders of the defined types were placed in this encounter.     Procedures: No procedures performed   Clinical Data: No additional findings.   Subjective: Chief Complaint  Patient presents with  . Left Hip - Follow-up    HPI Catherine Gardner returns today follow-up of her left hip status post intra-articular injection by Dr. Junius Roads.  She states the injection was helpful.  She states that she is having no pain in the right hip she was having some discomfort but her left hip that she had the injection and pain went away completely.  She does have a twinge of discomfort in the hip but nothing like it was prior to the injection.  Radiographs of her pelvis and left hip again showed no periosteal reactions.  Minimal arthritic changes with sclerosis of the acetabulum's bilaterally.  Review of Systems No fevers or chills.  Objective: Vital Signs: There were no vitals taken for this visit.  Physical Exam General: Well-developed well-nourished female no acute distress mood and affect appropriate Ortho Exam Bilateral hips good range of motion without pain today.  Particularly of the left hip internal rotation does  not cause discomfort as it did on prior exam. Specialty Comments:  No specialty comments available.  Imaging: No results found.   PMFS History: Patient Active Problem List   Diagnosis Date Noted  . Labile hypertension 05/18/2017  . Pre-syncope 05/18/2017  . DOE (dyspnea on exertion) 05/18/2017  . Lightheadedness 05/18/2017  . Spider veins of both lower extremities 06/06/2016  . Postlaminectomy syndrome, lumbar region 09/21/2015  . Lumbar radiculopathy, chronic 09/21/2015  . 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 Memory loss 03/25/2015  . Chronic pain syndrome 03/25/2015  . Memory dysfunction 02/16/2015  . Nonallopathic lesion of cervical region 12/25/2014  . Trigger point of left shoulder region 12/05/2014  . Whiplash injuries 12/04/2014  . Acute hemorrhoid 10/04/2014  . Fractured coccyx ([facility]) 10/04/2014  . Hormone replacement therapy 05/01/2014  . Nonallopathic lesion of thoracic region 03/02/2014  . Nonallopathic lesion of lumbosacral region 03/02/2014  . Nonallopathic lesion of sacral region 03/02/2014  . Trapezius muscle spasm 02/23/2014  . Menopausal symptoms 10/28/2013  . ANA positive 10/28/2013  . Chronic lower back pain 10/28/2013  . Varicose veins of lower extremities with other complications 05/20/7627  . Varicose veins with pain 05/06/2013  . Abdominal pain, other specified site 09/16/2012  . Polyarthralgia 09/16/2012  . Palpitations 10/26/2011  . Back pain 10/26/2011  . Dyspepsia and other specified disorders of function of stomach  06/06/2011  . Encounter for well adult exam with abnormal findings 02/27/2011  . GERD (gastroesophageal reflux disease) 02/27/2011  . ANXIETY 12/27/2010  . INSOMNIA 06/02/2010  . RASH-NONVESICULAR 06/02/2010  . SHINGLES 11/09/2009  . VITAMIN D DEFICIENCY 07/28/2009  . Hyperlipidemia 10/31/2007  . 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 Depression 10/31/2007  . Essential hypertension 10/31/2007  . ARTHRITIS 10/31/2007  .  POLYARTHRALGIA 10/31/2007  . LOW BACK PAIN 10/31/2007  . Fibromyalgia 10/31/2007  . Osteoporosis 10/31/2007   Past Medical History:  Diagnosis Date  . 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 ANXIETY 12/27/2010  . Arachnoiditis   . ARTHRITIS 10/31/2007  . Chronic lower back pain 10/28/2013  . DEPRESSION 10/31/2007  . FIBROMYALGIA 10/31/2007  . Headache(784.0) 07/12/2009  . HYPERLIPIDEMIA 10/31/2007  . HYPERTENSION 10/31/2007  . INSOMNIA 06/02/2010  . LOW BACK PAIN 10/31/2007  . Osteoporosis 10/31/2007  . POLYARTHRALGIA 10/31/2007  . RASH-NONVESICULAR 06/02/2010  . SHINGLES 11/09/2009  . SINUSITIS- ACUTE-NOS 12/20/2009  . VITAMIN D DEFICIENCY 07/28/2009    Family History  Problem Relation Age of Onset  . Hyperlipidemia Other   . Alcohol abuse Other        alcholism/addiction  . Crohn's disease Other   . Leukemia Father   . GER disease Mother   . Ovarian cancer Maternal Aunt   . Throat cancer Paternal Uncle   . ADD / ADHD Daughter   . Depression Daughter     Past Surgical History:  Procedure Laterality Date  . ABDOMINAL HYSTERECTOMY    . BREAST BIOPSY Left 02/26/2009  . implant     neuro spine stimulator  . LUMBAR FUSION  2008  . s/p diskectomy  2004   L5  . s/p right foot morton's neuroma  Jan. 2011   Social History   Occupational History  . Occupation: Chartered loss adjusterdisablied    Employer: UNEMPLOYED  Tobacco Use  . Smoking status: Never Smoker  . Smokeless tobacco: Never Used  Substance and Sexual Activity  . Alcohol use: Yes    Alcohol/week: 0.0 standard drinks    Comment: occ  . Drug use: No  . Sexual activity: Not on file

## 2019-05-15 ENCOUNTER — Other Ambulatory Visit: Payer: Self-pay

## 2019-05-15 DIAGNOSIS — M25552 Pain in left hip: Secondary | ICD-10-CM

## 2019-05-28 ENCOUNTER — Ambulatory Visit
Admission: RE | Admit: 2019-05-28 | Discharge: 2019-05-28 | Disposition: A | Discharge status: Home / Self Care | Payer: Medicare Other | Source: Ambulatory Visit | Attending: Internal Medicine | Admitting: Internal Medicine

## 2019-05-28 ENCOUNTER — Other Ambulatory Visit: Payer: Self-pay

## 2019-05-28 DIAGNOSIS — Z1231 Encounter for screening mammogram for malignant neoplasm of breast: Secondary | ICD-10-CM

## 2019-06-10 ENCOUNTER — Other Ambulatory Visit: Payer: Self-pay

## 2019-06-10 ENCOUNTER — Ambulatory Visit
Admission: RE | Admit: 2019-06-10 | Discharge: 2019-06-10 | Disposition: A | Discharge status: Home / Self Care | Payer: Medicaid Other | Source: Ambulatory Visit | Attending: Physician Assistant | Admitting: Physician Assistant

## 2019-06-10 DIAGNOSIS — M25552 Pain in left hip: Secondary | ICD-10-CM

## 2019-06-18 ENCOUNTER — Ambulatory Visit (INDEPENDENT_AMBULATORY_CARE_PROVIDER_SITE_OTHER): Payer: Medicare Other | Admitting: Orthopaedic Surgery

## 2019-06-18 ENCOUNTER — Other Ambulatory Visit: Payer: Self-pay

## 2019-06-18 ENCOUNTER — Encounter: Payer: Self-pay | Admitting: Orthopaedic Surgery

## 2019-06-18 VITALS — Ht 64.0 in | Wt 158.0 lb

## 2019-06-18 DIAGNOSIS — M25552 Pain in left hip: Secondary | ICD-10-CM | POA: Diagnosis not present

## 2019-06-18 NOTE — Progress Notes (Signed)
The patient is returning for follow-up to go over an MRI of her left hip.  She had a significant mechanical fall landing on that left hip and backside directly in May of this year.  After continued pain and the failure of conservative treatment we felt an MRI was warranted to assess the cartilage in her left hip as well as to make sure that she did not have any type of fracture or stress fracture given the pain that she was having.  She did have an intra-articular steroid injection in her left hip that did give her some relief of her symptoms.  She still has some pain in her groin and feels like she is getting better but is still been a slow process.  On exam she has fluid and full range of motion of her left hip and there is no pain over trochanteric area she does have pain in the groin on extremes of rotation.  MRI is reviewed and it does show some mild thinning of the articular cartilage at the weightbearing surface of the hip at the superior and anterior edge of the femoral head and acetabulum.  There is also some slight degenerative changes in the labrum.  There is some hamstring and gluteus medius and minimus tendinosis.  There is no obvious signs of fracture or stress in the bone there is no edema in the weightbearing surfaces either.  I gave her the MRI findings and reassurance that hopefully with time her symptoms will significantly improve or hopefully resolve since she was asymptomatic before this happened.  Obviously any type of trauma can accelerate arthritic process so things worsen at all she will let us know.  Again I feel that the MRI was medically warranted given the failure of conservative treatment and concerned that she may have a fracture that was being missed on plain films.  All question concerns were answered addressed.  Follow-up as otherwise as needed.

## 2020-01-19 ENCOUNTER — Other Ambulatory Visit: Payer: Self-pay | Admitting: Endocrinology

## 2020-01-19 DIAGNOSIS — M810 Age-related osteoporosis without current pathological fracture: Secondary | ICD-10-CM

## 2020-02-12 ENCOUNTER — Ambulatory Visit: Payer: Medicare Other | Attending: Internal Medicine

## 2020-02-12 DIAGNOSIS — Z23 Encounter for immunization: Secondary | ICD-10-CM

## 2020-02-12 NOTE — Progress Notes (Signed)
   Covid-19 Vaccination Clinic  Name:  Valicia Rief    MRN: [id number] DOB: 06/17/1961  02/12/2020  Ms. Pfahler was observed post Covid-19 immunization for 30 minutes based on pre-vaccination screening without incident. She was provided with Vaccine Information Sheet and instruction to access the V-Safe system.   Ms. Gitto was instructed to call 911 with any severe reactions post vaccine: Marland Kitchen Difficulty breathing  . Swelling of face and throat  . A fast heartbeat  . A bad rash all over body  . Dizziness and weakness   Immunizations Administered    Name Date Dose VIS Date Route   Pfizer COVID-19 Vaccine 02/12/2020  3:46 PM 0.3 mL 11/07/2019 Intramuscular   Manufacturer: [employer], [employer]   Lot: [id number]   NDC: [address]-1

## 2020-03-08 ENCOUNTER — Ambulatory Visit: Payer: Medicare Other | Attending: Internal Medicine

## 2020-03-08 DIAGNOSIS — Z23 Encounter for immunization: Secondary | ICD-10-CM

## 2020-03-08 NOTE — Progress Notes (Signed)
   Covid-19 Vaccination Clinic  Name:  Catherine Gardner    MRN: [id number] DOB: 1961/10/16  03/08/2020  [name] was observed post Covid-19 immunization for 15 minutes without incident. She was provided with Vaccine Information Sheet and instruction to access the V-Safe system.   [name] was instructed to call 911 with any severe reactions post vaccine: Marland Kitchen Difficulty breathing  . Swelling of face and throat  . A fast heartbeat  . A bad rash all over body  . Dizziness and weakness   Immunizations Administered    Name Date Dose VIS Date Route   Pfizer COVID-19 Vaccine 03/08/2020  4:11 PM 0.3 mL 11/07/2019 Intramuscular   Manufacturer: [employer], [employer]   Lot: [id number]   NDC: [address]-9

## 2020-04-05 ENCOUNTER — Ambulatory Visit
Admission: RE | Admit: 2020-04-05 | Discharge: 2020-04-05 | Disposition: A | Discharge status: Home / Self Care | Payer: Medicare Other | Source: Ambulatory Visit | Attending: Endocrinology | Admitting: Endocrinology

## 2020-04-05 ENCOUNTER — Other Ambulatory Visit: Payer: Self-pay

## 2020-04-05 DIAGNOSIS — M810 Age-related osteoporosis without current pathological fracture: Secondary | ICD-10-CM

## 2020-06-14 ENCOUNTER — Ambulatory Visit: Payer: Medicare Other | Admitting: Physician Assistant

## 2020-07-01 ENCOUNTER — Other Ambulatory Visit: Payer: Self-pay | Admitting: Internal Medicine

## 2020-07-01 DIAGNOSIS — Z1231 Encounter for screening mammogram for malignant neoplasm of breast: Secondary | ICD-10-CM

## 2020-07-06 ENCOUNTER — Other Ambulatory Visit: Payer: Self-pay

## 2020-07-06 ENCOUNTER — Ambulatory Visit
Admission: RE | Admit: 2020-07-06 | Discharge: 2020-07-06 | Disposition: A | Discharge status: Home / Self Care | Payer: Medicare Other | Source: Ambulatory Visit | Attending: Internal Medicine | Admitting: Internal Medicine

## 2020-07-06 ENCOUNTER — Ambulatory Visit: Payer: Medicare Other

## 2020-07-06 DIAGNOSIS — Z1231 Encounter for screening mammogram for malignant neoplasm of breast: Secondary | ICD-10-CM

## 2020-07-19 ENCOUNTER — Ambulatory Visit (INDEPENDENT_AMBULATORY_CARE_PROVIDER_SITE_OTHER): Payer: Medicare Other | Admitting: Physician Assistant

## 2020-07-19 ENCOUNTER — Ambulatory Visit: Payer: Self-pay

## 2020-07-19 ENCOUNTER — Encounter: Payer: Self-pay | Admitting: Physician Assistant

## 2020-07-19 DIAGNOSIS — M25552 Pain in left hip: Secondary | ICD-10-CM | POA: Diagnosis not present

## 2020-07-19 NOTE — Progress Notes (Signed)
Office Visit Note   Patient: Catherine Gardner           Date of Birth: September 08, 1961           MRN: [id number] Visit Date: 07/19/2020              Requested by: Catherine Ishihara, MD [address] [place],  [place] [address] PCP: Catherine Ishihara, MD   Assessment & Plan: Visit Diagnoses:  1. Pain in left hip     Plan: Due to the fact the patient had good results with the intra-articular injection of the left hip recommend repeat intra-articular injection and have her follow-up in 4 weeks to see how she is doing.  Questions were encouraged and answered at length.  I discussed with her that an intra-articular injection could be treatment as long as it lasts for good period of time preferably greater than 6 months.  Would not recommend injections at greater than every 6 months in the left hip.  Follow-Up Instructions: No follow-ups on file.   Orders:  Orders Placed This Encounter  Procedures  . XR HIP UNILAT W OR W/O PELVIS 2-3 VIEWS LEFT   No orders of the defined types were placed in this encounter.     Procedures: No procedures performed   Clinical Data: No additional findings.   Subjective: Chief Complaint  Patient presents with  . Left Hip - Pain    HPI Catherine Gardner comes in today for left hip pain.  She was last seen in the office 06/18/2019 for left hip and underwent MRI of her left hip 06/10/2019 and this showed some mild arthritis of the hip.  Slight degenerative changes of the labrum.  Also tendinosis involving the hamstrings and gluteus medius. Patient reports just recently she has had increasing pain deep in the left hip groin region.  Pain does radiate down to the thigh.  She states she is very cautious with walking movement for fear that the hip may give way.  Deep hip pain does wake her.  She is undergone 1 intra-articular injection in the hip that gave her good relief.  Given a history of mechanical fall landing on left hip directly in May  2020 otherwise no acute injuries. Review of Systems Please see HPI.  Objective: Vital Signs: There were no vitals taken for this visit.  Physical Exam Constitutional:      Appearance: She is not ill-appearing or diaphoretic.  Pulmonary:     Effort: Pulmonary effort is normal.  Neurological:     Mental Status: She is oriented to person, place, and time.  Psychiatric:        Mood and Affect: Mood normal.     Ortho Exam Bilateral hips good range of motion of both hips.  Internal rotation of the left hip causes pain.  Good range of motion left knee without pain. Specialty Comments:  No specialty comments available.  Imaging: XR HIP UNILAT W OR W/O PELVIS 2-3 VIEWS LEFT  Result Date: 07/19/2020 Left hip and AP pelvis: Bilateral hips well located.  Some minimal arthritic changes of the left hip.  Otherwise unchanged from prior films.  Possible cystic changes within the femoral head left hip.  No signs of AVN.    PMFS History: Patient Active Problem List   Diagnosis Date Noted  . Labile hypertension 05/18/2017  . Pre-syncope 05/18/2017  . DOE (dyspnea on exertion) 05/18/2017  . Lightheadedness 05/18/2017  . Spider veins of both lower extremities 06/06/2016  .  Postlaminectomy syndrome, lumbar region 09/21/2015  . Lumbar radiculopathy, chronic 09/21/2015  . 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 Memory loss 03/25/2015  . Chronic pain syndrome 03/25/2015  . Memory dysfunction 02/16/2015  . Nonallopathic lesion of cervical region 12/25/2014  . Trigger point of left shoulder region 12/05/2014  . Whiplash injuries 12/04/2014  . Acute hemorrhoid 10/04/2014  . Fractured coccyx ([facility]) 10/04/2014  . Hormone replacement therapy 05/01/2014  . Nonallopathic lesion of thoracic region 03/02/2014  . Nonallopathic lesion of lumbosacral region 03/02/2014  . Nonallopathic lesion of sacral region 03/02/2014  . Trapezius muscle spasm 02/23/2014  . Menopausal symptoms 10/28/2013  . ANA positive  10/28/2013  . Chronic lower back pain 10/28/2013  . 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 Varicose veins with pain 05/06/2013  . Abdominal pain, other specified site 09/16/2012  . Polyarthralgia 09/16/2012  . Palpitations 10/26/2011  . Back pain 10/26/2011  . Dyspepsia and other specified disorders of function of stomach 06/06/2011  . Encounter for well adult exam with abnormal findings 02/27/2011  . GERD (gastroesophageal reflux disease) 02/27/2011  . ANXIETY 12/27/2010  . INSOMNIA 06/02/2010  . RASH-NONVESICULAR 06/02/2010  . SHINGLES 11/09/2009  . VITAMIN D DEFICIENCY 07/28/2009  . Hyperlipidemia 10/31/2007  . 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 Depression 10/31/2007  . Essential hypertension 10/31/2007  . ARTHRITIS 10/31/2007  . POLYARTHRALGIA 10/31/2007  . LOW BACK PAIN 10/31/2007  . Fibromyalgia 10/31/2007  . Osteoporosis 10/31/2007   Past Medical History:  Diagnosis Date  . 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 ANXIETY 12/27/2010  . Arachnoiditis   . ARTHRITIS 10/31/2007  . Chronic lower back pain 10/28/2013  . DEPRESSION 10/31/2007  . FIBROMYALGIA 10/31/2007  . Headache(784.0) 07/12/2009  . HYPERLIPIDEMIA 10/31/2007  . HYPERTENSION 10/31/2007  . INSOMNIA 06/02/2010  . LOW BACK PAIN 10/31/2007  . Osteoporosis 10/31/2007  . POLYARTHRALGIA 10/31/2007  . RASH-NONVESICULAR 06/02/2010  . SHINGLES 11/09/2009  . SINUSITIS- ACUTE-NOS 12/20/2009  . VITAMIN D DEFICIENCY 07/28/2009    Family History  Problem Relation Age of Onset  . Hyperlipidemia Other   . Alcohol abuse Other        alcholism/addiction  . Crohn's disease Other   . Leukemia Father   . GER disease Mother   . Ovarian cancer Maternal Aunt   . Throat cancer Paternal Uncle   . ADD / ADHD Daughter   . Depression Daughter     Past Surgical History:  Procedure Laterality Date  . ABDOMINAL HYSTERECTOMY    . BREAST BIOPSY Left 02/26/2009  . implant     neuro spine stimulator  .  LUMBAR FUSION  2008  . s/p diskectomy  2004   L5  . s/p right foot morton's neuroma  Jan. 2011   Social History   Occupational History  . Occupation: Chartered loss adjuster: UNEMPLOYED  Tobacco Use  . Smoking status: Never Smoker  . Smokeless tobacco: Never Used  Substance and Sexual Activity  . Alcohol use: Yes    Alcohol/week: 0.0 standard drinks    Comment: occ  . Drug use: No  . Sexual activity: Not on file

## 2020-07-22 ENCOUNTER — Other Ambulatory Visit: Payer: Self-pay

## 2020-07-22 ENCOUNTER — Encounter: Payer: Self-pay | Admitting: Family Medicine

## 2020-07-22 ENCOUNTER — Ambulatory Visit: Payer: Self-pay

## 2020-07-22 ENCOUNTER — Ambulatory Visit (INDEPENDENT_AMBULATORY_CARE_PROVIDER_SITE_OTHER): Payer: Medicare Other | Admitting: Family Medicine

## 2020-07-22 DIAGNOSIS — M25552 Pain in left hip: Secondary | ICD-10-CM | POA: Diagnosis not present

## 2020-07-22 NOTE — Addendum Note (Signed)
Addended by: Rip Harbour on: 07/22/2020 09:36 AM   Modules accepted: Orders

## 2020-07-22 NOTE — Progress Notes (Signed)
Subjective: Patient is here for ultrasound-guided intra-articular left hip injection.   Injection 1 year ago took the edge off for a while.  Lately the pain is keeping her from sleeping well, she is unable to walk for exercise, and she starts limping after walking in the store.  Objective: She has pain with passive flexion and internal rotation, although her range of motion is still pretty good.  Procedure: Ultrasound-guided left hip injection: After sterile prep with Betadine, injected 8 cc 1% lidocaine without epinephrine and 40 mg methylprednisolone using a 22-gauge spinal needle, passing the needle through the iliofemoral ligament into the femoral head/neck junction.  Injectate was seen filling the joint capsule, which had a small effusion at the time of exam.  She had good relief during the immediate anesthetic phase.

## 2020-08-16 ENCOUNTER — Other Ambulatory Visit: Payer: Self-pay

## 2020-08-16 ENCOUNTER — Encounter: Payer: Self-pay | Admitting: Orthopaedic Surgery

## 2020-08-16 ENCOUNTER — Ambulatory Visit (INDEPENDENT_AMBULATORY_CARE_PROVIDER_SITE_OTHER): Payer: 59 | Admitting: Orthopaedic Surgery

## 2020-08-16 DIAGNOSIS — M25552 Pain in left hip: Secondary | ICD-10-CM | POA: Diagnosis not present

## 2020-08-16 NOTE — Progress Notes (Signed)
The patient is very well-known to me.  She is an active 59 year old female who we first saw last year with left hip pain that worsened after a mechanical fall with trauma to the left hip.  A steroid injection helped significantly in terms of calming down the pain in her left hip.  We had a steroid injection placed under ultrasound after a MRI of the hip showed just some mild arthritic changes in the femoral head and some degenerative tearing of the labrum.  That lasted her for a year.  She then came back with worsening left hip pain again.  More recently we sent her back to Dr. Prince Rome for another ultrasound-guided injection 1 year out from her previous injection in the left hip.  She said the injection was very helpful but only lasted for a few weeks.  Now the pain is come back in her groin.  It hurts mainly with the left hip when she significantly rotates that left hip.  It is become an achy pain at times.  On examination of her left hip she has smooth and congruent motion of the hip but pain on the extremes of internal and external rotation.  Her right hip moves more smoothly.  I would like to refer her to Dr. Duwayne Heck from [facility] to evaluate her for the possibility of an arthroscopic intervention based on the MRI findings showing just some mild arthritic changes in that hip with some degenerative tearing of the labrum and based on her clinical exam of how she feels.  She understands that this is a referral to get his opinion of whether or not an arthroscopic intervention is warranted and she is requested this as well.  I agree with this totally based on her MRI and her physical exam.  If there is awaiting arthroscopic intervention can temporize the symptoms it may be worth doing so.  However, I cautioned her that he will absolutely let us know whether or not this is something that the scope will just prolong the inevitable of needing a hip replacement.  I certainly feel that this referral is  warranted based on her clinical exam and MRI findings because an arthroscopic intervention could buy her even longer time.  We will work on making a referral.

## 2020-09-11 ENCOUNTER — Ambulatory Visit: Payer: 59 | Attending: Internal Medicine

## 2020-09-11 DIAGNOSIS — Z23 Encounter for immunization: Secondary | ICD-10-CM

## 2020-09-11 NOTE — Progress Notes (Signed)
   Covid-19 Vaccination Clinic  Name:  Catherine Gardner    MRN: [id number] DOB: 1961-11-16  09/11/2020  Catherine Gardner was observed post Covid-19 immunization for 15 minutes without incident. She was provided with Vaccine Information Sheet and instruction to access the V-Safe system.   Catherine Gardner was instructed to call 911 with any severe reactions post vaccine: Marland Kitchen Difficulty breathing  . Swelling of face and throat  . A fast heartbeat  . A bad rash all over body  . Dizziness and weakness

## 2021-01-31 ENCOUNTER — Emergency Department (HOSPITAL_COMMUNITY)
Admission: EM | Admit: 2021-01-31 | Discharge: 2021-01-31 | Disposition: A | Discharge status: Home / Self Care | Payer: 59 | Attending: Emergency Medicine | Admitting: Emergency Medicine

## 2021-01-31 ENCOUNTER — Encounter (HOSPITAL_COMMUNITY): Payer: Self-pay

## 2021-01-31 ENCOUNTER — Other Ambulatory Visit: Payer: Self-pay

## 2021-01-31 ENCOUNTER — Emergency Department (HOSPITAL_COMMUNITY): Payer: 59

## 2021-01-31 DIAGNOSIS — I10 Essential (primary) hypertension: Secondary | ICD-10-CM | POA: Insufficient documentation

## 2021-01-31 DIAGNOSIS — R079 Chest pain, unspecified: Secondary | ICD-10-CM | POA: Diagnosis present

## 2021-01-31 DIAGNOSIS — R0789 Other chest pain: Secondary | ICD-10-CM | POA: Diagnosis not present

## 2021-01-31 DIAGNOSIS — M791 Myalgia, unspecified site: Secondary | ICD-10-CM | POA: Insufficient documentation

## 2021-01-31 DIAGNOSIS — Z79899 Other long term (current) drug therapy: Secondary | ICD-10-CM | POA: Diagnosis not present

## 2021-01-31 DIAGNOSIS — Z8616 Personal history of COVID-19: Secondary | ICD-10-CM | POA: Diagnosis not present

## 2021-01-31 LAB — BASIC METABOLIC PANEL
Anion gap: 10 (ref 5–15)
BUN: 18 mg/dL (ref 8–23)
CO2: 23 mmol/L (ref 22–32)
Calcium: 9.3 mg/dL (ref 8.9–10.3)
Chloride: 106 mmol/L (ref 98–111)
Creatinine, Ser: 0.76 mg/dL (ref 0.44–1.00)
GFR, Estimated: >60 mL/min (ref >60)
Glucose, Bld: 99 mg/dL (ref 70–99)
Potassium: 3.8 mmol/L (ref 3.5–5.1)
Sodium: 139 mmol/L (ref 135–145)

## 2021-01-31 LAB — CBC
HCT: 39.3 % (ref 36.0–46.0)
Hemoglobin: 13.2 g/dL (ref 12.0–15.0)
MCH: 31.8 pg (ref 26.0–34.0)
MCHC: 33.6 g/dL (ref 30.0–36.0)
MCV: 94.7 fL (ref 80.0–100.0)
Platelets: 228 10*3/uL (ref 150–400)
RBC: 4.15 MIL/uL (ref 3.87–5.11)
RDW: 12.5 % (ref 11.5–15.5)
WBC: 6.1 10*3/uL (ref 4.0–10.5)
nRBC: 0 % (ref 0.0–0.2)

## 2021-01-31 LAB — TROPONIN I (HIGH SENSITIVITY)
Troponin I (High Sensitivity): 2 ng/L (ref <18)
Troponin I (High Sensitivity): <2 ng/L (ref <18)

## 2021-01-31 MED ORDER — NAPROXEN 500 MG PO TABS: ORAL_TABLET | ORAL | 0 refills | Status: AC

## 2021-01-31 NOTE — ED Provider Notes (Signed)
[facility]-EMERGENCY DEPT Provider Note   CSN: [id number] Arrival date & time: 01/31/21  1415     History Chief Complaint  Patient presents with  . Chest Pain    Catherine Gardner is a 60 y.o. female.  Patient complains of mild aching in her chest.  No shortness of breath no cough.  She recently tested positive for Covid but also tested positive the end of January.  Patient has no shortness of breath fever chills  The history is provided by the patient and medical records. No language interpreter was used.  Chest Pain Pain location:  Unable to specify Pain quality: aching   Pain radiates to:  Does not radiate Pain severity:  No pain Onset quality:  Sudden Timing:  Constant Associated symptoms: no abdominal pain, no back pain, no cough, no fatigue and no headache        Past Medical History:  Diagnosis Date  . 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 ANXIETY 12/27/2010  . Arachnoiditis   . ARTHRITIS 10/31/2007  . Chronic lower back pain 10/28/2013  . DEPRESSION 10/31/2007  . FIBROMYALGIA 10/31/2007  . Headache(784.0) 07/12/2009  . HYPERLIPIDEMIA 10/31/2007  . HYPERTENSION 10/31/2007  . INSOMNIA 06/02/2010  . LOW BACK PAIN 10/31/2007  . Osteoporosis 10/31/2007  . POLYARTHRALGIA 10/31/2007  . RASH-NONVESICULAR 06/02/2010  . SHINGLES 11/09/2009  . SINUSITIS- ACUTE-NOS 12/20/2009  . VITAMIN D DEFICIENCY 07/28/2009    Patient Active Problem List   Diagnosis Date Noted  . Labile hypertension 05/18/2017  . Pre-syncope 05/18/2017  . DOE (dyspnea on exertion) 05/18/2017  . Lightheadedness 05/18/2017  . 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 Spider veins of both lower extremities 06/06/2016  . Postlaminectomy syndrome, lumbar region 09/21/2015  . Lumbar radiculopathy, chronic 09/21/2015  . 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 Memory loss 03/25/2015  . Chronic pain syndrome 03/25/2015  . Memory dysfunction 02/16/2015  . Nonallopathic lesion of cervical  region 12/25/2014  . Trigger point of left shoulder region 12/05/2014  . Whiplash injuries 12/04/2014  . Acute hemorrhoid 10/04/2014  . Fractured coccyx (HCC) 10/04/2014  . Hormone replacement therapy 05/01/2014  . Nonallopathic lesion of thoracic region 03/02/2014  . Nonallopathic lesion of lumbosacral region 03/02/2014  . Nonallopathic lesion of sacral region 03/02/2014  . Trapezius muscle spasm 02/23/2014  . Menopausal symptoms 10/28/2013  . ANA positive 10/28/2013  . Chronic lower back pain 10/28/2013  . 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 Varicose veins with pain 05/06/2013  . Myalgia and myositis 04/30/2013  . Abdominal pain, other specified site 09/16/2012  . Polyarthralgia 09/16/2012  . Palpitations 10/26/2011  . Back pain 10/26/2011  . Dyspepsia and other specified disorders of function of stomach 06/06/2011  . Encounter for well adult exam with abnormal findings 02/27/2011  . GERD (gastroesophageal reflux disease) 02/27/2011  . ANXIETY 12/27/2010  . INSOMNIA 06/02/2010  . RASH-NONVESICULAR 06/02/2010  . SHINGLES 11/09/2009  . VITAMIN D DEFICIENCY 07/28/2009  . Hyperlipidemia 10/31/2007  . 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 Depression 10/31/2007  . Essential hypertension 10/31/2007  . ARTHRITIS 10/31/2007  . POLYARTHRALGIA 10/31/2007  . LOW BACK PAIN 10/31/2007  . Fibromyalgia 10/31/2007  . Osteoporosis 10/31/2007    Past Surgical History:  Procedure Laterality Date  . ABDOMINAL HYSTERECTOMY    . BREAST BIOPSY Left 02/26/2009  . implant     neuro spine stimulator  . LUMBAR FUSION  2008  . s/p diskectomy  2004   L5  . s/p right foot  morton's neuroma  Jan. 2011     OB History   No obstetric history on file.     Family History  Problem Relation Age of Onset  . Hyperlipidemia Other   . Alcohol abuse Other        alcholism/addiction  . Crohn's disease Other   . Leukemia Father   . GER disease Mother   .  Ovarian cancer Maternal Aunt   . Throat cancer Paternal Uncle   . ADD / ADHD Daughter   . Depression Daughter     Social History   Tobacco Use  . Smoking status: Never Smoker  . Smokeless tobacco: Never Used  Substance Use Topics  . Alcohol use: Yes    Alcohol/week: 0.0 standard drinks    Comment: occ  . Drug use: No    Home Medications Prior to Admission medications   Medication Sig Start Date End Date Taking? Authorizing Provider  naproxen (NAPROSYN) 500 MG tablet Take 1 every 12 hours as needed for mild discomfort 01/31/21  Yes Bethann Berkshire, MD  calcium-vitamin D (OSCAL WITH D) 500-200 MG-UNIT per tablet Take 1 tablet by mouth daily.      [provider]  Cholecalciferol (VITAMIN D-3 PO) Take 2,000 mg by mouth daily.    [provider]  famotidine (PEPCID) 20 MG tablet Take 20 mg by mouth daily. 05/10/20   [provider]  hydrochlorothiazide (HYDRODIURIL) 12.5 MG tablet Take 12.5 mg by mouth daily.    [provider]  labetalol (NORMODYNE) 200 MG tablet Take 1 tablet (200 mg total) by mouth 2 (two) times daily. 05/03/17   Corwin Levins, MD  Multiple Vitamin (MULTIVITAMIN) capsule Take 1 capsule by mouth daily.    [provider]  potassium chloride (KLOR-CON) 10 MEQ tablet Take 10 mEq by mouth daily. 02/16/20   [provider]  topiramate (TOPAMAX) 50 MG tablet Take 50 mg by mouth 2 (two) times daily. 07/07/20   [provider]    Allergies    Meperidine, Morphine, Penicillins, Sumatriptan, Etodolac, Meperidine hcl, Dilaudid [hydromorphone hcl], Effexor [venlafaxine], Imitrex [sumatriptan base], Benazepril, Nickel, and Simvastatin  Review of Systems   Review of Systems  Constitutional: Negative for appetite change and fatigue.  HENT: Negative for congestion, ear discharge and sinus pressure.   Eyes: Negative for discharge.  Respiratory: Negative for cough.   Cardiovascular: Positive for chest pain.   Gastrointestinal: Negative for abdominal pain and diarrhea.  Genitourinary: Negative for frequency and hematuria.  Musculoskeletal: Negative for back pain.  Skin: Negative for rash.  Neurological: Negative for seizures and headaches.  Psychiatric/Behavioral: Negative for hallucinations.    Physical Exam Updated Vital Signs BP 139/73   Pulse 69   Temp 98.8 F (37.1 C) (Oral)   Resp 16   Ht 5\' 4"  (1.626 m)   Wt 67.1 kg   SpO2 99%   BMI 25.40 kg/m   Physical Exam Vitals and nursing note reviewed.  Constitutional:      Appearance: She is well-developed.  HENT:     Head: Normocephalic.     Mouth/Throat:     Mouth: Mucous membranes are moist.  Eyes:     General: No scleral icterus.    Extraocular Movements: EOM normal.     Conjunctiva/sclera: Conjunctivae normal.  Neck:     Thyroid: No thyromegaly.  Cardiovascular:     Rate and Rhythm: Normal rate and regular rhythm.     Heart sounds: No murmur heard. No friction rub. No gallop.  Pulmonary:     Breath sounds: No stridor. No wheezing or rales.  Chest:     Chest wall: No tenderness.  Abdominal:     General: There is no distension.     Tenderness: There is no abdominal tenderness. There is no rebound.  Musculoskeletal:        General: No edema. Normal range of motion.     Cervical back: Neck supple.  Lymphadenopathy:     Cervical: No cervical adenopathy.  Skin:    Findings: No erythema or rash.  Neurological:     Mental Status: She is alert and oriented to person, place, and time.     Motor: No abnormal muscle tone.     Coordination: Coordination normal.  Psychiatric:        Mood and Affect: Mood and affect normal.        Behavior: Behavior normal.     ED Results / Procedures / Treatments   Labs (all labs ordered are listed, but only abnormal results are displayed) Labs Reviewed  BASIC METABOLIC PANEL  CBC  TROPONIN I (HIGH SENSITIVITY)  TROPONIN I (HIGH SENSITIVITY)    EKG EKG  Interpretation  Date/Time:  Monday January 31 2021 14:23:35 EST Ventricular Rate:  84 PR Interval:    QRS Duration: 87 QT Interval:  360 QTC Calculation: 426 R Axis:   29 Text Interpretation: Sinus rhythm Low voltage, precordial leads Probable anteroseptal infarct, old Baseline wander in lead(s) V1 12 Lead; Mason-Likar Confirmed by Bethann Berkshire [id number]) on 01/31/2021 4:40:11 PM   Radiology DG Chest 2 View  Result Date: 01/31/2021 CLINICAL DATA:  Chest pain and shortness of breath. Nonproductive cough. COVID positive this week. EXAM: CHEST - 2 VIEW COMPARISON:  05/09/2017 FINDINGS: The cardiomediastinal contours are normal. The lungs are clear. Pulmonary vasculature is normal. No consolidation, pleural effusion, or pneumothorax. No acute osseous abnormalities are seen. IMPRESSION: No acute chest findings.  No evidence of pneumonia. Electronically Signed   By: Narda Rutherford M.D.   On: 01/31/2021 15:19    Procedures Procedures   Medications Ordered in ED Medications - No data to display  ED Course  I have reviewed the triage vital signs and the nursing notes.  Pertinent labs & imaging results that were available during my care of the patient were reviewed by me and considered in my medical decision making (see chart for details).    MDM Rules/Calculators/A&P                         Patient with myalgias.  She also has positive Covid test but she was +6 weeks ago also.  Patient not hypoxic not toxic.  She will be discharged home with some nonsteroidals and follow-up with her PCP  Final Clinical Impression(s) / ED Diagnoses Final diagnoses:  Atypical chest pain    Rx / DC Orders ED Discharge Orders         Ordered    naproxen (NAPROSYN) 500 MG tablet        01/31/21 1912           Bethann Berkshire, MD 01/31/21 [phone]

## 2021-01-31 NOTE — ED Triage Notes (Signed)
Pt presents with c/o chest pain. Pt reports the pain is in the center of her chest, her back, and bilateral rib cage area. Pt reports she was diagnosed with Covid on 3/4 and that is when the pain began.

## 2021-01-31 NOTE — Discharge Instructions (Addendum)
Follow-up with your doctor next week for recheck if any problems 

## 2021-07-26 ENCOUNTER — Other Ambulatory Visit: Payer: Self-pay | Admitting: Internal Medicine

## 2021-07-26 DIAGNOSIS — Z1231 Encounter for screening mammogram for malignant neoplasm of breast: Secondary | ICD-10-CM

## 2021-08-15 ENCOUNTER — Ambulatory Visit: Payer: 59

## 2021-09-14 ENCOUNTER — Ambulatory Visit: Payer: 59

## 2021-10-10 ENCOUNTER — Ambulatory Visit
Admission: RE | Admit: 2021-10-10 | Discharge: 2021-10-10 | Disposition: A | Discharge status: Home / Self Care | Payer: 59 | Source: Ambulatory Visit | Attending: Internal Medicine | Admitting: Internal Medicine

## 2021-10-10 ENCOUNTER — Other Ambulatory Visit: Payer: Self-pay

## 2021-10-10 DIAGNOSIS — Z1231 Encounter for screening mammogram for malignant neoplasm of breast: Secondary | ICD-10-CM

## 2022-01-17 DIAGNOSIS — I10 Essential (primary) hypertension: Secondary | ICD-10-CM | POA: Diagnosis not present

## 2022-01-17 DIAGNOSIS — M810 Age-related osteoporosis without current pathological fracture: Secondary | ICD-10-CM | POA: Diagnosis not present

## 2022-01-17 DIAGNOSIS — E559 Vitamin D deficiency, unspecified: Secondary | ICD-10-CM | POA: Diagnosis not present

## 2022-02-28 DIAGNOSIS — Z0000 Encounter for general adult medical examination without abnormal findings: Secondary | ICD-10-CM | POA: Diagnosis not present

## 2022-02-28 DIAGNOSIS — E785 Hyperlipidemia, unspecified: Secondary | ICD-10-CM | POA: Diagnosis not present

## 2022-02-28 DIAGNOSIS — Z23 Encounter for immunization: Secondary | ICD-10-CM | POA: Diagnosis not present

## 2022-02-28 DIAGNOSIS — Z1389 Encounter for screening for other disorder: Secondary | ICD-10-CM | POA: Diagnosis not present

## 2022-02-28 DIAGNOSIS — I10 Essential (primary) hypertension: Secondary | ICD-10-CM | POA: Diagnosis not present

## 2022-02-28 DIAGNOSIS — M810 Age-related osteoporosis without current pathological fracture: Secondary | ICD-10-CM | POA: Diagnosis not present

## 2022-02-28 DIAGNOSIS — R202 Paresthesia of skin: Secondary | ICD-10-CM | POA: Diagnosis not present

## 2022-03-16 ENCOUNTER — Other Ambulatory Visit: Payer: Self-pay | Admitting: Endocrinology

## 2022-03-16 DIAGNOSIS — M810 Age-related osteoporosis without current pathological fracture: Secondary | ICD-10-CM

## 2022-04-19 ENCOUNTER — Other Ambulatory Visit: Payer: Self-pay | Admitting: Internal Medicine

## 2022-04-19 ENCOUNTER — Ambulatory Visit
Admission: RE | Admit: 2022-04-19 | Discharge: 2022-04-19 | Disposition: A | Discharge status: Home / Self Care | Payer: Medicare Other | Source: Ambulatory Visit | Attending: Internal Medicine | Admitting: Internal Medicine

## 2022-04-19 DIAGNOSIS — M2550 Pain in unspecified joint: Secondary | ICD-10-CM | POA: Diagnosis not present

## 2022-04-19 DIAGNOSIS — M7918 Myalgia, other site: Secondary | ICD-10-CM

## 2022-04-19 DIAGNOSIS — Z8616 Personal history of COVID-19: Secondary | ICD-10-CM | POA: Diagnosis not present

## 2022-04-19 DIAGNOSIS — M546 Pain in thoracic spine: Secondary | ICD-10-CM | POA: Diagnosis not present

## 2022-04-19 DIAGNOSIS — I10 Essential (primary) hypertension: Secondary | ICD-10-CM | POA: Diagnosis not present

## 2022-05-08 DIAGNOSIS — E785 Hyperlipidemia, unspecified: Secondary | ICD-10-CM | POA: Diagnosis not present

## 2022-05-10 DIAGNOSIS — I10 Essential (primary) hypertension: Secondary | ICD-10-CM | POA: Diagnosis not present

## 2022-05-10 DIAGNOSIS — E785 Hyperlipidemia, unspecified: Secondary | ICD-10-CM | POA: Diagnosis not present

## 2022-05-10 DIAGNOSIS — M7918 Myalgia, other site: Secondary | ICD-10-CM | POA: Diagnosis not present

## 2022-05-19 DIAGNOSIS — M50122 Cervical disc disorder at C5-C6 level with radiculopathy: Secondary | ICD-10-CM | POA: Diagnosis not present

## 2022-05-19 DIAGNOSIS — M961 Postlaminectomy syndrome, not elsewhere classified: Secondary | ICD-10-CM | POA: Diagnosis not present

## 2022-05-19 DIAGNOSIS — M7918 Myalgia, other site: Secondary | ICD-10-CM | POA: Diagnosis not present

## 2022-05-23 ENCOUNTER — Other Ambulatory Visit: Payer: Self-pay | Admitting: Orthopedic Surgery

## 2022-05-23 DIAGNOSIS — M50122 Cervical disc disorder at C5-C6 level with radiculopathy: Secondary | ICD-10-CM

## 2022-05-23 DIAGNOSIS — M4722 Other spondylosis with radiculopathy, cervical region: Secondary | ICD-10-CM

## 2022-06-07 ENCOUNTER — Ambulatory Visit
Admission: RE | Admit: 2022-06-07 | Discharge: 2022-06-07 | Disposition: A | Discharge status: Home / Self Care | Payer: Medicare Other | Source: Ambulatory Visit | Attending: Orthopedic Surgery | Admitting: Orthopedic Surgery

## 2022-06-07 DIAGNOSIS — M4802 Spinal stenosis, cervical region: Secondary | ICD-10-CM | POA: Diagnosis not present

## 2022-06-07 DIAGNOSIS — M4722 Other spondylosis with radiculopathy, cervical region: Secondary | ICD-10-CM

## 2022-06-07 DIAGNOSIS — M50122 Cervical disc disorder at C5-C6 level with radiculopathy: Secondary | ICD-10-CM

## 2022-06-16 DIAGNOSIS — M50122 Cervical disc disorder at C5-C6 level with radiculopathy: Secondary | ICD-10-CM | POA: Diagnosis not present

## 2022-06-22 DIAGNOSIS — M50122 Cervical disc disorder at C5-C6 level with radiculopathy: Secondary | ICD-10-CM | POA: Diagnosis not present

## 2022-07-10 DIAGNOSIS — I10 Essential (primary) hypertension: Secondary | ICD-10-CM | POA: Diagnosis not present

## 2022-07-10 DIAGNOSIS — R5383 Other fatigue: Secondary | ICD-10-CM | POA: Diagnosis not present

## 2022-07-10 DIAGNOSIS — E785 Hyperlipidemia, unspecified: Secondary | ICD-10-CM | POA: Diagnosis not present

## 2022-07-10 DIAGNOSIS — E8889 Other specified metabolic disorders: Secondary | ICD-10-CM | POA: Diagnosis not present

## 2022-07-10 DIAGNOSIS — K219 Gastro-esophageal reflux disease without esophagitis: Secondary | ICD-10-CM | POA: Diagnosis not present

## 2022-07-13 DIAGNOSIS — M50121 Cervical disc disorder at C4-C5 level with radiculopathy: Secondary | ICD-10-CM | POA: Diagnosis not present

## 2022-07-13 DIAGNOSIS — M50122 Cervical disc disorder at C5-C6 level with radiculopathy: Secondary | ICD-10-CM | POA: Diagnosis not present

## 2022-07-13 DIAGNOSIS — M4802 Spinal stenosis, cervical region: Secondary | ICD-10-CM | POA: Diagnosis not present

## 2022-07-14 DIAGNOSIS — M47812 Spondylosis without myelopathy or radiculopathy, cervical region: Secondary | ICD-10-CM | POA: Diagnosis not present

## 2022-07-14 DIAGNOSIS — G894 Chronic pain syndrome: Secondary | ICD-10-CM | POA: Diagnosis not present

## 2022-07-14 DIAGNOSIS — E785 Hyperlipidemia, unspecified: Secondary | ICD-10-CM | POA: Diagnosis not present

## 2022-07-14 DIAGNOSIS — I10 Essential (primary) hypertension: Secondary | ICD-10-CM | POA: Diagnosis not present

## 2022-07-24 DIAGNOSIS — I10 Essential (primary) hypertension: Secondary | ICD-10-CM | POA: Diagnosis not present

## 2022-07-24 DIAGNOSIS — G894 Chronic pain syndrome: Secondary | ICD-10-CM | POA: Diagnosis not present

## 2022-07-28 DIAGNOSIS — M4802 Spinal stenosis, cervical region: Secondary | ICD-10-CM | POA: Diagnosis not present

## 2022-07-28 DIAGNOSIS — M50121 Cervical disc disorder at C4-C5 level with radiculopathy: Secondary | ICD-10-CM | POA: Diagnosis not present

## 2022-08-03 DIAGNOSIS — Z01818 Encounter for other preprocedural examination: Secondary | ICD-10-CM | POA: Diagnosis not present

## 2022-08-03 DIAGNOSIS — Z01812 Encounter for preprocedural laboratory examination: Secondary | ICD-10-CM | POA: Diagnosis not present

## 2022-08-03 DIAGNOSIS — M4802 Spinal stenosis, cervical region: Secondary | ICD-10-CM | POA: Diagnosis not present

## 2022-08-09 DIAGNOSIS — M50021 Cervical disc disorder at C4-C5 level with myelopathy: Secondary | ICD-10-CM | POA: Diagnosis not present

## 2022-08-09 DIAGNOSIS — Z981 Arthrodesis status: Secondary | ICD-10-CM | POA: Diagnosis not present

## 2022-08-09 DIAGNOSIS — M4302 Spondylolysis, cervical region: Secondary | ICD-10-CM | POA: Diagnosis not present

## 2022-08-09 DIAGNOSIS — M4322 Fusion of spine, cervical region: Secondary | ICD-10-CM | POA: Diagnosis not present

## 2022-08-09 DIAGNOSIS — M50121 Cervical disc disorder at C4-C5 level with radiculopathy: Secondary | ICD-10-CM | POA: Diagnosis not present

## 2022-08-09 DIAGNOSIS — M4802 Spinal stenosis, cervical region: Secondary | ICD-10-CM | POA: Diagnosis not present

## 2022-08-09 DIAGNOSIS — M5412 Radiculopathy, cervical region: Secondary | ICD-10-CM | POA: Diagnosis not present

## 2022-08-09 DIAGNOSIS — M4712 Other spondylosis with myelopathy, cervical region: Secondary | ICD-10-CM | POA: Diagnosis not present

## 2022-08-09 DIAGNOSIS — M50122 Cervical disc disorder at C5-C6 level with radiculopathy: Secondary | ICD-10-CM | POA: Diagnosis not present

## 2022-08-09 DIAGNOSIS — M4722 Other spondylosis with radiculopathy, cervical region: Secondary | ICD-10-CM | POA: Diagnosis not present

## 2022-08-10 DIAGNOSIS — M50021 Cervical disc disorder at C4-C5 level with myelopathy: Secondary | ICD-10-CM | POA: Diagnosis not present

## 2022-08-10 DIAGNOSIS — R531 Weakness: Secondary | ICD-10-CM | POA: Diagnosis not present

## 2022-08-10 DIAGNOSIS — M4322 Fusion of spine, cervical region: Secondary | ICD-10-CM | POA: Diagnosis not present

## 2022-08-10 DIAGNOSIS — Z9889 Other specified postprocedural states: Secondary | ICD-10-CM | POA: Diagnosis not present

## 2022-08-10 DIAGNOSIS — M4722 Other spondylosis with radiculopathy, cervical region: Secondary | ICD-10-CM | POA: Diagnosis not present

## 2022-08-10 DIAGNOSIS — M4312 Spondylolisthesis, cervical region: Secondary | ICD-10-CM | POA: Diagnosis not present

## 2022-08-10 DIAGNOSIS — M4802 Spinal stenosis, cervical region: Secondary | ICD-10-CM | POA: Diagnosis not present

## 2022-08-10 DIAGNOSIS — M7989 Other specified soft tissue disorders: Secondary | ICD-10-CM | POA: Diagnosis not present

## 2022-08-11 DIAGNOSIS — I10 Essential (primary) hypertension: Secondary | ICD-10-CM | POA: Diagnosis not present

## 2022-08-11 DIAGNOSIS — M4322 Fusion of spine, cervical region: Secondary | ICD-10-CM | POA: Diagnosis not present

## 2022-08-11 DIAGNOSIS — R262 Difficulty in walking, not elsewhere classified: Secondary | ICD-10-CM | POA: Diagnosis not present

## 2022-08-11 DIAGNOSIS — M6281 Muscle weakness (generalized): Secondary | ICD-10-CM | POA: Diagnosis not present

## 2022-08-14 DIAGNOSIS — M6281 Muscle weakness (generalized): Secondary | ICD-10-CM | POA: Diagnosis not present

## 2022-08-14 DIAGNOSIS — M4322 Fusion of spine, cervical region: Secondary | ICD-10-CM | POA: Diagnosis not present

## 2022-08-14 DIAGNOSIS — I10 Essential (primary) hypertension: Secondary | ICD-10-CM | POA: Diagnosis not present

## 2022-08-14 DIAGNOSIS — R262 Difficulty in walking, not elsewhere classified: Secondary | ICD-10-CM | POA: Diagnosis not present

## 2022-08-17 DIAGNOSIS — R262 Difficulty in walking, not elsewhere classified: Secondary | ICD-10-CM | POA: Diagnosis not present

## 2022-08-17 DIAGNOSIS — M4322 Fusion of spine, cervical region: Secondary | ICD-10-CM | POA: Diagnosis not present

## 2022-08-17 DIAGNOSIS — I10 Essential (primary) hypertension: Secondary | ICD-10-CM | POA: Diagnosis not present

## 2022-08-17 DIAGNOSIS — M6281 Muscle weakness (generalized): Secondary | ICD-10-CM | POA: Diagnosis not present

## 2022-08-18 DIAGNOSIS — R262 Difficulty in walking, not elsewhere classified: Secondary | ICD-10-CM | POA: Diagnosis not present

## 2022-08-18 DIAGNOSIS — M6281 Muscle weakness (generalized): Secondary | ICD-10-CM | POA: Diagnosis not present

## 2022-08-18 DIAGNOSIS — M4322 Fusion of spine, cervical region: Secondary | ICD-10-CM | POA: Diagnosis not present

## 2022-08-21 DIAGNOSIS — I10 Essential (primary) hypertension: Secondary | ICD-10-CM | POA: Diagnosis not present

## 2022-08-21 DIAGNOSIS — M4322 Fusion of spine, cervical region: Secondary | ICD-10-CM | POA: Diagnosis not present

## 2022-08-21 DIAGNOSIS — M6281 Muscle weakness (generalized): Secondary | ICD-10-CM | POA: Diagnosis not present

## 2022-08-21 DIAGNOSIS — R262 Difficulty in walking, not elsewhere classified: Secondary | ICD-10-CM | POA: Diagnosis not present

## 2022-08-23 DIAGNOSIS — R262 Difficulty in walking, not elsewhere classified: Secondary | ICD-10-CM | POA: Diagnosis not present

## 2022-08-23 DIAGNOSIS — I10 Essential (primary) hypertension: Secondary | ICD-10-CM | POA: Diagnosis not present

## 2022-08-23 DIAGNOSIS — M6281 Muscle weakness (generalized): Secondary | ICD-10-CM | POA: Diagnosis not present

## 2022-08-23 DIAGNOSIS — M4322 Fusion of spine, cervical region: Secondary | ICD-10-CM | POA: Diagnosis not present

## 2022-08-28 ENCOUNTER — Other Ambulatory Visit: Payer: Self-pay | Admitting: Internal Medicine

## 2022-08-28 DIAGNOSIS — M6281 Muscle weakness (generalized): Secondary | ICD-10-CM | POA: Diagnosis not present

## 2022-08-28 DIAGNOSIS — R262 Difficulty in walking, not elsewhere classified: Secondary | ICD-10-CM | POA: Diagnosis not present

## 2022-08-28 DIAGNOSIS — I10 Essential (primary) hypertension: Secondary | ICD-10-CM | POA: Diagnosis not present

## 2022-08-28 DIAGNOSIS — Z1231 Encounter for screening mammogram for malignant neoplasm of breast: Secondary | ICD-10-CM

## 2022-08-28 DIAGNOSIS — M4322 Fusion of spine, cervical region: Secondary | ICD-10-CM | POA: Diagnosis not present

## 2022-08-29 DIAGNOSIS — I10 Essential (primary) hypertension: Secondary | ICD-10-CM | POA: Diagnosis not present

## 2022-08-29 DIAGNOSIS — M47812 Spondylosis without myelopathy or radiculopathy, cervical region: Secondary | ICD-10-CM | POA: Diagnosis not present

## 2022-08-30 DIAGNOSIS — R262 Difficulty in walking, not elsewhere classified: Secondary | ICD-10-CM | POA: Diagnosis not present

## 2022-08-30 DIAGNOSIS — M4322 Fusion of spine, cervical region: Secondary | ICD-10-CM | POA: Diagnosis not present

## 2022-08-30 DIAGNOSIS — I10 Essential (primary) hypertension: Secondary | ICD-10-CM | POA: Diagnosis not present

## 2022-08-30 DIAGNOSIS — M6281 Muscle weakness (generalized): Secondary | ICD-10-CM | POA: Diagnosis not present

## 2022-09-04 ENCOUNTER — Ambulatory Visit
Admission: RE | Admit: 2022-09-04 | Discharge: 2022-09-04 | Disposition: A | Discharge status: Home / Self Care | Payer: Medicare Other | Source: Ambulatory Visit | Attending: Endocrinology | Admitting: Endocrinology

## 2022-09-04 DIAGNOSIS — M85851 Other specified disorders of bone density and structure, right thigh: Secondary | ICD-10-CM | POA: Diagnosis not present

## 2022-09-04 DIAGNOSIS — M810 Age-related osteoporosis without current pathological fracture: Secondary | ICD-10-CM

## 2022-09-04 DIAGNOSIS — Z780 Asymptomatic menopausal state: Secondary | ICD-10-CM | POA: Diagnosis not present

## 2022-09-08 DIAGNOSIS — E049 Nontoxic goiter, unspecified: Secondary | ICD-10-CM | POA: Diagnosis not present

## 2022-09-08 DIAGNOSIS — I10 Essential (primary) hypertension: Secondary | ICD-10-CM | POA: Diagnosis not present

## 2022-09-08 DIAGNOSIS — M7062 Trochanteric bursitis, left hip: Secondary | ICD-10-CM | POA: Diagnosis not present

## 2022-09-08 DIAGNOSIS — M791 Myalgia, unspecified site: Secondary | ICD-10-CM | POA: Diagnosis not present

## 2022-09-08 DIAGNOSIS — M810 Age-related osteoporosis without current pathological fracture: Secondary | ICD-10-CM | POA: Diagnosis not present

## 2022-09-08 DIAGNOSIS — M4322 Fusion of spine, cervical region: Secondary | ICD-10-CM | POA: Diagnosis not present

## 2022-09-08 DIAGNOSIS — E559 Vitamin D deficiency, unspecified: Secondary | ICD-10-CM | POA: Diagnosis not present

## 2022-09-18 DIAGNOSIS — E785 Hyperlipidemia, unspecified: Secondary | ICD-10-CM | POA: Diagnosis not present

## 2022-09-22 DIAGNOSIS — G894 Chronic pain syndrome: Secondary | ICD-10-CM | POA: Diagnosis not present

## 2022-09-22 DIAGNOSIS — I10 Essential (primary) hypertension: Secondary | ICD-10-CM | POA: Diagnosis not present

## 2022-09-22 DIAGNOSIS — E785 Hyperlipidemia, unspecified: Secondary | ICD-10-CM | POA: Diagnosis not present

## 2022-09-22 DIAGNOSIS — M810 Age-related osteoporosis without current pathological fracture: Secondary | ICD-10-CM | POA: Diagnosis not present

## 2022-09-25 DIAGNOSIS — I10 Essential (primary) hypertension: Secondary | ICD-10-CM | POA: Diagnosis not present

## 2022-09-25 DIAGNOSIS — M810 Age-related osteoporosis without current pathological fracture: Secondary | ICD-10-CM | POA: Diagnosis not present

## 2022-10-12 ENCOUNTER — Ambulatory Visit
Admission: RE | Admit: 2022-10-12 | Discharge: 2022-10-12 | Disposition: A | Discharge status: Home / Self Care | Payer: Medicare Other | Source: Ambulatory Visit | Attending: Internal Medicine | Admitting: Internal Medicine

## 2022-10-12 ENCOUNTER — Ambulatory Visit: Payer: Medicare Other

## 2022-10-12 DIAGNOSIS — Z1231 Encounter for screening mammogram for malignant neoplasm of breast: Secondary | ICD-10-CM

## 2022-10-12 DIAGNOSIS — I10 Essential (primary) hypertension: Secondary | ICD-10-CM | POA: Diagnosis not present

## 2022-10-12 DIAGNOSIS — Z23 Encounter for immunization: Secondary | ICD-10-CM | POA: Diagnosis not present

## 2022-11-03 DIAGNOSIS — M4712 Other spondylosis with myelopathy, cervical region: Secondary | ICD-10-CM | POA: Diagnosis not present

## 2022-11-03 DIAGNOSIS — M7062 Trochanteric bursitis, left hip: Secondary | ICD-10-CM | POA: Diagnosis not present

## 2022-11-03 DIAGNOSIS — M4802 Spinal stenosis, cervical region: Secondary | ICD-10-CM | POA: Diagnosis not present

## 2022-11-03 DIAGNOSIS — M4322 Fusion of spine, cervical region: Secondary | ICD-10-CM | POA: Diagnosis not present

## 2022-11-16 ENCOUNTER — Ambulatory Visit: Payer: Medicare Other

## 2022-11-28 ENCOUNTER — Other Ambulatory Visit: Payer: Self-pay

## 2022-11-28 ENCOUNTER — Encounter: Payer: Self-pay | Admitting: Physical Therapy

## 2022-11-28 ENCOUNTER — Ambulatory Visit (INDEPENDENT_AMBULATORY_CARE_PROVIDER_SITE_OTHER): Payer: Medicare Other | Admitting: Physical Therapy

## 2022-11-28 DIAGNOSIS — M6281 Muscle weakness (generalized): Secondary | ICD-10-CM

## 2022-11-28 DIAGNOSIS — M542 Cervicalgia: Secondary | ICD-10-CM | POA: Diagnosis not present

## 2022-11-28 DIAGNOSIS — M5459 Other low back pain: Secondary | ICD-10-CM | POA: Diagnosis not present

## 2022-11-28 NOTE — Therapy (Signed)
OUTPATIENT PHYSICAL THERAPY CERVICAL/Back EVALUATION   Patient Name: Catherine Gardner MRN: [id number] DOB:[birth year], 62 y.o., female Today's Date: 11/28/2022  END OF SESSION:  PT End of Session - 11/28/22 1345     Visit Number 1    Number of Visits 12    Date for PT Re-Evaluation 01/09/23    Progress Note Due on Visit 10    PT Start Time 1300    PT Stop Time 1344    PT Time Calculation (min) 44 min    Activity Tolerance Patient tolerated treatment well    Behavior During Therapy [facility] for tasks assessed/performed             Past Medical History:  Diagnosis Date   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   ANXIETY 12/27/2010   Arachnoiditis    ARTHRITIS 10/31/2007   Chronic lower back pain 10/28/2013   DEPRESSION 10/31/2007   FIBROMYALGIA 10/31/2007   Headache(784.0) 07/12/2009   HYPERLIPIDEMIA 10/31/2007   HYPERTENSION 10/31/2007   INSOMNIA 06/02/2010   LOW BACK PAIN 10/31/2007   Osteoporosis 10/31/2007   POLYARTHRALGIA 10/31/2007   RASH-NONVESICULAR 06/02/2010   SHINGLES 11/09/2009   SINUSITIS- ACUTE-NOS 12/20/2009   VITAMIN D DEFICIENCY 07/28/2009   Past Surgical History:  Procedure Laterality Date   ABDOMINAL HYSTERECTOMY     BREAST BIOPSY Left 02/26/2009   implant     neuro spine stimulator   LUMBAR FUSION  2008   s/p diskectomy  2004   L5   s/p right foot morton's neuroma  Jan. 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   Memory loss 03/25/2015   Chronic pain syndrome 03/25/2015   Memory dysfunction 02/16/2015   Nonallopathic lesion of cervical region 12/25/2014   Trigger point of left shoulder region  12/05/2014   Whiplash injuries 12/04/2014   Acute hemorrhoid 10/04/2014   Fractured coccyx (HCC) 10/04/2014   Hormone replacement therapy 05/01/2014   Nonallopathic lesion of thoracic region 03/02/2014   Nonallopathic lesion of lumbosacral region 03/02/2014   Nonallopathic lesion of sacral region 03/02/2014   Trapezius muscle spasm 02/23/2014   Menopausal symptoms 10/28/2013   ANA positive 10/28/2013   Chronic lower back pain 10/28/2013   Varicose veins of lower extremities with other complications 05/15/2013   Varicose veins with pain 05/06/2013   Myalgia and myositis 04/30/2013   Abdominal pain, other specified site 09/16/2012   Polyarthralgia 09/16/2012   Palpitations 10/26/2011   Back pain 10/26/2011   Dyspepsia and other specified disorders of function of stomach 06/06/2011   Encounter for well adult exam with abnormal findings 02/27/2011   GERD (gastroesophageal reflux disease) 02/27/2011   ANXIETY 12/27/2010   INSOMNIA 06/02/2010   RASH-NONVESICULAR 06/02/2010   SHINGLES 11/09/2009   VITAMIN D DEFICIENCY 07/28/2009   Hyperlipidemia 10/31/2007   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   Depression 10/31/2007   Essential hypertension 10/31/2007   ARTHRITIS 10/31/2007   POLYARTHRALGIA 10/31/2007   LOW BACK PAIN 10/31/2007   Fibromyalgia 10/31/2007   Osteoporosis 10/31/2007    PCP: Lorenda Ishihara, MD   REFERRING PROVIDER: Adelfa Koh, MD  REFERRING DIAG: [phone] (ICD-10-CM) - Fusion of spine, cervical region Lumbar spine and left leg strengthening  THERAPY  DIAG:  Cervicalgia  Other low back pain  Muscle weakness (generalized)  Rationale for Evaluation and Treatment: Rehabilitation  ONSET DATE: chronic neck and back pain, most recent surgery was neck fusion 08/09/22  SUBJECTIVE:                                                                                                                                                                                                          SUBJECTIVE STATEMENT: She says back pain since she was 8, she has had lumbar fusion, she has spinal stimulator. She also had neck fusion September 2023 and has difficulty turning her head and neck since.   PERTINENT HISTORY:  HQP:RFFM 08/09/22, lumbar fusion, lumbar spinal stimulator, chronic pain, depression, fibromyalgia, osteoporosis  PAIN:  Are you having pain? Yes: NPRS scale: neck: 2-5 and back is 5/10 Pain location: left neck and shoulder, and low back Pain description: ache and burn Aggravating factors: turning her head left, driving, sleeping, too much activity Relieving factors: rest, 0 gravity  PRECAUTIONS: Cervical/Lumbar Fusion  WEIGHT BEARING RESTRICTIONS: No  FALLS:  Has patient fallen in last 6 months? Yes. Number of falls 1, tripped on curb and fell 11/26/22 on knee caps  and forearms so some left shoulder pain since thi  OCCUPATION: work from home  PLOF: Independent  PATIENT GOALS: reduce pain, feel better, turn her head more to the left  NEXT MD VISIT: 12/01/21  OBJECTIVE:   DIAGNOSTIC FINDINGS:  Will have XR when she sees MD again 12/01/21  PATIENT SURVEYS:  Eval: FOTO 52% functional, goal is 54%  COGNITION: Overall cognitive status: Within functional limits for tasks assessed  SENSATION: WFL  POSTURE: rounded shoulders  PALPATION: Eval: TTP in Cervical-thoracic-lumbar spine, in left shoulder and scapula, upper trap   CERVICAL ROM:   Active ROM AROM (deg) eval  Flexion 30  Extension 20  Right lateral flexion 20  Left lateral flexion 10  Right rotation 60  Left rotation 30   (Blank rows = not tested)  Lumbar ROM at EVAL Flexion: 50% Extension: 75% Rotation: WNL   UPPER EXTREMITY ROM:  Active ROM Right eval Left eval  Shoulder flexion 120 110  Shoulder extension    Shoulder abduction 120 110  Shoulder adduction    Shoulder extension    Shoulder internal rotation WNL WNL  Shoulder external  rotation WNL WNL  Elbow flexion    Elbow extension    Wrist flexion    Wrist extension    Wrist ulnar deviation    Wrist radial deviation  Wrist pronation    Wrist supination     (Blank rows = not tested)  UPPER/LOWER EXTREMITY MMT:  MMT Right eval Left eval  Shoulder flexion 4 4  Shoulder extension    Shoulder abduction 4 3  Shoulder adduction    Shoulder extension    Shoulder internal rotation 5 5  Shoulder external rotation 5       Hip flexion in sitting 4+ 3  Hip abd in sitting 4+ 4+  Knee extension 5 4-  Knee flexion 5 4  Wrist extension    Wrist ulnar deviation    Wrist radial deviation    Wrist pronation    Wrist supination    Grip strength     (Blank rows = not tested)  SPECIAL TESTS:  Spurling's test: Negative SLUMP test + on left, inconclusive on Rt  FUNCTIONAL TESTS:    TODAY'S TREATMENT:  Eval HEP creation and review with demonstration and trial set preformed, see below for details    PATIENT EDUCATION: Education details: HEP, PT plan of care Person educated: Patient Education method: Explanation, Demonstration, Verbal cues, and Handouts Education comprehension: verbalized understanding and needs further education   HOME EXERCISE PROGRAM: Access Code: [email or web address] URL: [email or web address] Date: 11/28/2022 Prepared by: Elsie Ra  Exercises - Seated Assisted Cervical Rotation with Towel  - 2 x daily - 6 x weekly - 1 sets - 10 reps - 5 hold - Seated Cervical Sidebending Stretch (Mirrored)  - 2 x daily - 6 x weekly - 1 sets - 3 reps - 20 sec hold - Standing Scapular Retraction with External Rotation  - 2 x daily - 6 x weekly - 1-2 sets - 10 reps - Slump Stretch  - 2 x daily - 6 x weekly - 1-2 sets - 10 reps - 3 hold - Seated Straight Leg Raise with Quad Contraction  - 2 x daily - 6 x weekly - 1-2 sets - 10 reps - Sit to Stand  - 2 x daily - 6 x weekly - 1-2 sets - 10 reps  ASSESSMENT:  CLINICAL IMPRESSION: Patient  referred to PT for Chronic neck and back pain S/P ACDF 08/09/22, lumbar fusion, and lumbar spinal stimulator, She has decreased strength in left UE and LE.  Patient will benefit from skilled PT to address below impairments, limitations and improve overall function.  OBJECTIVE IMPAIRMENTS: decreased activity tolerance, difficulty walking,  decreased endurance, decreased mobility, decreased ROM, decreased strength, impaired flexibility, impaired UE/LE use, postural dysfunction, and pain.  ACTIVITY LIMITATIONS: bending, lifting, carry, locomotion, cleaning, community activity, driving, and or occupation  PERSONAL FACTORS: UMP:NTIR 08/09/22, lumbar fusion, lumbar spinal stimulator, chronic pain, depression, fibromyalgia, osteoporosis are also affecting patient's functional outcome.  REHAB POTENTIAL: Good  CLINICAL DECISION MAKING: Evolving/moderate complexity  EVALUATION COMPLEXITY: Moderate    GOALS: Short term PT Goals Target date: 12/26/2022   Pt will be I and compliant with HEP. Baseline:  Goal status: New Pt will improve Left cervical rotation to 45 deg Baseline:30 Goal status: New  Long term PT goals Target date:01/09/2023   Pt will neck left cervical rotation ROM to 60 deg to improve scanning for driving Baseline: Goal status: New Pt will improve  Left hip/knee strength to at least 4/5 MMT to improve functional strength Baseline:3 Goal status: New Pt will improve FOTO to at least 54% functional to show improved function Baseline: Goal status: New Pt will reduce pain by overall 50% overall with usual activity Baseline: Goal status: New Pt will reduce  pain to overall less than 2-3/10 with usual activity, work, and sleeping Baseline: Goal status: New  PLAN: PT FREQUENCY: 1-2 times per week   PT DURATION: 6 weeks  PLANNED INTERVENTIONS (unless contraindicated): aquatic PT, Canalith repositioning, cryotherapy, Electrical stimulation, Iontophoresis with 4 mg/ml  dexamethasome, Moist heat, traction, Ultrasound, gait training, Therapeutic exercise, balance training, neuromuscular re-education, patient/family education, prosthetic training, manual techniques, passive ROM, dry needling, taping, vasopnuematic device, vestibular, spinal manipulations, joint manipulations  PLAN FOR NEXT SESSION: review and update HEP, needs left leg strengthening to tolerance, consider manual or DN to neck but history of fusions in neck/back   April Manson, PT,DPT 11/28/2022, 1:47 PM

## 2022-12-01 DIAGNOSIS — M7062 Trochanteric bursitis, left hip: Secondary | ICD-10-CM | POA: Diagnosis not present

## 2022-12-01 DIAGNOSIS — M4802 Spinal stenosis, cervical region: Secondary | ICD-10-CM | POA: Diagnosis not present

## 2022-12-01 DIAGNOSIS — G039 Meningitis, unspecified: Secondary | ICD-10-CM | POA: Diagnosis not present

## 2022-12-01 DIAGNOSIS — M4712 Other spondylosis with myelopathy, cervical region: Secondary | ICD-10-CM | POA: Diagnosis not present

## 2022-12-01 DIAGNOSIS — M4322 Fusion of spine, cervical region: Secondary | ICD-10-CM | POA: Diagnosis not present

## 2022-12-04 ENCOUNTER — Ambulatory Visit: Payer: Medicare Other | Admitting: Physical Therapy

## 2022-12-04 ENCOUNTER — Encounter: Payer: Self-pay | Admitting: Physical Therapy

## 2022-12-04 DIAGNOSIS — M542 Cervicalgia: Secondary | ICD-10-CM

## 2022-12-04 DIAGNOSIS — M6281 Muscle weakness (generalized): Secondary | ICD-10-CM | POA: Diagnosis not present

## 2022-12-04 DIAGNOSIS — M5459 Other low back pain: Secondary | ICD-10-CM

## 2022-12-04 NOTE — Therapy (Deleted)
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   ANXIETY 12/27/2010   Arachnoiditis    ARTHRITIS 10/31/2007   Chronic lower back pain 10/28/2013   DEPRESSION 10/31/2007   FIBROMYALGIA 10/31/2007   Headache(784.0) 07/12/2009   HYPERLIPIDEMIA 10/31/2007   HYPERTENSION 10/31/2007   INSOMNIA 06/02/2010   LOW BACK PAIN 10/31/2007   Osteoporosis 10/31/2007   POLYARTHRALGIA 10/31/2007   RASH-NONVESICULAR 06/02/2010   SHINGLES 11/09/2009   SINUSITIS- ACUTE-NOS 12/20/2009   VITAMIN D DEFICIENCY 07/28/2009   Past Surgical History:  Procedure Laterality Date   ABDOMINAL HYSTERECTOMY     BREAST BIOPSY Left 02/26/2009   implant     neuro spine stimulator   LUMBAR FUSION  2008   s/p diskectomy  2004   L5   s/p right foot morton's neuroma  Jan. 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   Memory loss 03/25/2015   Chronic pain syndrome 03/25/2015   Memory dysfunction 02/16/2015   Nonallopathic lesion of cervical region 12/25/2014   Trigger point of left shoulder region 12/05/2014   Whiplash injuries 12/04/2014   Acute hemorrhoid 10/04/2014   Fractured coccyx (HCC) 10/04/2014   Hormone replacement therapy 05/01/2014   Nonallopathic lesion of thoracic region 03/02/2014   Nonallopathic lesion of lumbosacral region 03/02/2014   Nonallopathic lesion of sacral region 03/02/2014   Trapezius muscle spasm 02/23/2014   Menopausal  symptoms 10/28/2013   ANA positive 10/28/2013   Chronic lower back pain 10/28/2013   Varicose veins of lower extremities with other complications 05/15/2013   Varicose veins with pain 05/06/2013   Myalgia and myositis 04/30/2013   Abdominal pain, other specified site 09/16/2012   Polyarthralgia 09/16/2012   Palpitations 10/26/2011   Back pain 10/26/2011   Dyspepsia and other specified disorders of function of stomach 06/06/2011   Encounter for well adult exam with abnormal findings 02/27/2011   GERD (gastroesophageal reflux disease) 02/27/2011   ANXIETY 12/27/2010   INSOMNIA 06/02/2010   RASH-NONVESICULAR 06/02/2010   SHINGLES 11/09/2009   VITAMIN D DEFICIENCY 07/28/2009   Hyperlipidemia 10/31/2007   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   Depression 10/31/2007   Essential hypertension 10/31/2007   ARTHRITIS 10/31/2007   POLYARTHRALGIA 10/31/2007   LOW BACK PAIN 10/31/2007   Fibromyalgia 10/31/2007   Osteoporosis 10/31/2007    PCP: Lorenda Ishihara, MD   REFERRING PROVIDER: Adelfa Koh, MD  REFERRING DIAG: [phone] (ICD-10-CM) - Fusion of spine, cervical region Lumbar spine and left leg strengthening  THERAPY DIAG:  No diagnosis found.  Rationale for Evaluation and Treatment: Rehabilitation  ONSET DATE: chronic neck and back pain, most recent surgery was neck fusion 08/09/22  SUBJECTIVE:  SUBJECTIVE STATEMENT: She says back pain since she was 8, she has had lumbar fusion, she has spinal stimulator. She also had neck fusion September 2023 and has difficulty turning her head and neck since.   PERTINENT HISTORY:  YWV:PXTG 08/09/22, lumbar fusion, lumbar spinal stimulator, chronic pain, depression, fibromyalgia, osteoporosis  PAIN:  Are you having pain? Yes:  NPRS scale: neck: 2-5 and back is 5/10 Pain location: left neck and shoulder, and low back Pain description: ache and burn Aggravating factors: turning her head left, driving, sleeping, too much activity Relieving factors: rest, 0 gravity  PRECAUTIONS: Cervical/Lumbar Fusion  WEIGHT BEARING RESTRICTIONS: No  FALLS:  Has patient fallen in last 6 months? Yes. Number of falls 1, tripped on curb and fell 11/26/22 on knee caps  and forearms so some left shoulder pain since thi  OCCUPATION: work from home  PLOF: Independent  PATIENT GOALS: reduce pain, feel better, turn her head more to the left  NEXT MD VISIT: 12/01/21  OBJECTIVE:   DIAGNOSTIC FINDINGS:  Will have XR when she sees MD again 12/01/21  PATIENT SURVEYS:  Eval: FOTO 52% functional, goal is 54%  COGNITION: Overall cognitive status: Within functional limits for tasks assessed  SENSATION: WFL  POSTURE: rounded shoulders  PALPATION: Eval: TTP in Cervical-thoracic-lumbar spine, in left shoulder and scapula, upper trap   CERVICAL ROM:   Active ROM AROM (deg) eval  Flexion 30  Extension 20  Right lateral flexion 20  Left lateral flexion 10  Right rotation 60  Left rotation 30   (Blank rows = not tested)  Lumbar ROM at EVAL Flexion: 50% Extension: 75% Rotation: WNL   UPPER EXTREMITY ROM:  Active ROM Right eval Left eval  Shoulder flexion 120 110  Shoulder extension    Shoulder abduction 120 110  Shoulder adduction    Shoulder extension    Shoulder internal rotation WNL WNL  Shoulder external rotation WNL WNL  Elbow flexion    Elbow extension    Wrist flexion    Wrist extension    Wrist ulnar deviation    Wrist radial deviation    Wrist pronation    Wrist supination     (Blank rows = not tested)  UPPER/LOWER EXTREMITY MMT:  MMT Right eval Left eval  Shoulder flexion 4 4  Shoulder extension    Shoulder abduction 4 3  Shoulder adduction    Shoulder extension    Shoulder internal  rotation 5 5  Shoulder external rotation 5       Hip flexion in sitting 4+ 3  Hip abd in sitting 4+ 4+  Knee extension 5 4-  Knee flexion 5 4  Wrist extension    Wrist ulnar deviation    Wrist radial deviation    Wrist pronation    Wrist supination    Grip strength     (Blank rows = not tested)  SPECIAL TESTS:  Spurling's test: Negative SLUMP test + on left, inconclusive on Rt  FUNCTIONAL TESTS:    TODAY'S TREATMENT:  Eval HEP creation and review with demonstration and trial set preformed, see below for details    PATIENT EDUCATION: Education details: HEP, PT plan of care Person educated: Patient Education method: Explanation, Demonstration, Verbal cues, and Handouts Education comprehension: verbalized understanding and needs further education   HOME EXERCISE PROGRAM: Access Code: [email or web address] URL: [email or web address] Date: 11/28/2022 Prepared by: Elsie Ra  Exercises - Seated Assisted Cervical Rotation with Towel  - 2 x daily - 6 x weekly - 1  sets - 10 reps - 5 hold - Seated Cervical Sidebending Stretch (Mirrored)  - 2 x daily - 6 x weekly - 1 sets - 3 reps - 20 sec hold - Standing Scapular Retraction with External Rotation  - 2 x daily - 6 x weekly - 1-2 sets - 10 reps - Slump Stretch  - 2 x daily - 6 x weekly - 1-2 sets - 10 reps - 3 hold - Seated Straight Leg Raise with Quad Contraction  - 2 x daily - 6 x weekly - 1-2 sets - 10 reps - Sit to Stand  - 2 x daily - 6 x weekly - 1-2 sets - 10 reps  ASSESSMENT:  CLINICAL IMPRESSION: Patient referred to PT for Chronic neck and back pain S/P ACDF 08/09/22, lumbar fusion, and lumbar spinal stimulator, She has decreased strength in left UE and LE.  Patient will benefit from skilled PT to address below impairments, limitations and improve overall function.  OBJECTIVE IMPAIRMENTS: decreased activity tolerance, difficulty walking,  decreased endurance, decreased mobility, decreased ROM, decreased strength,  impaired flexibility, impaired UE/LE use, postural dysfunction, and pain.  ACTIVITY LIMITATIONS: bending, lifting, carry, locomotion, cleaning, community activity, driving, and or occupation  PERSONAL FACTORS: [id number] 08/09/22, lumbar fusion, lumbar spinal stimulator, chronic pain, depression, fibromyalgia, osteoporosis are also affecting patient's functional outcome.  REHAB POTENTIAL: Good  CLINICAL DECISION MAKING: Evolving/moderate complexity  EVALUATION COMPLEXITY: Moderate    GOALS: Short term PT Goals Target date: 12/26/2022   Pt will be I and compliant with HEP. Baseline:  Goal status: New Pt will improve Left cervical rotation to 45 deg Baseline:30 Goal status: New  Long term PT goals Target date:01/09/2023   Pt will neck left cervical rotation ROM to 60 deg to improve scanning for driving Baseline: Goal status: New Pt will improve  Left hip/knee strength to at least 4/5 MMT to improve functional strength Baseline:3 Goal status: New Pt will improve FOTO to at least 54% functional to show improved function Baseline: Goal status: New Pt will reduce pain by overall 50% overall with usual activity Baseline: Goal status: New Pt will reduce pain to overall less than 2-3/10 with usual activity, work, and sleeping Baseline: Goal status: New  PLAN: PT FREQUENCY: 1-2 times per week   PT DURATION: 6 weeks  PLANNED INTERVENTIONS (unless contraindicated): aquatic PT, Canalith repositioning, cryotherapy, Electrical stimulation, Iontophoresis with 4 mg/ml dexamethasome, Moist heat, traction, Ultrasound, gait training, Therapeutic exercise, balance training, neuromuscular re-education, patient/family education, prosthetic training, manual techniques, passive ROM, dry needling, taping, vasopnuematic device, vestibular, spinal manipulations, joint manipulations  PLAN FOR NEXT SESSION: review and update HEP, needs left leg strengthening to tolerance, consider manual or DN to  neck but history of fusions in neck/back   Oretha Caprice, PT,DPT 12/04/2022, 1:04 PM

## 2022-12-04 NOTE — Therapy (Signed)
OUTPATIENT PHYSICAL THERAPY TREATMENT   Patient Name: Catherine Gardner MRN: [id number] DOB:15-Oct-1961, 62 y.o., female Today's Date: 12/04/2022  END OF SESSION:  PT End of Session - 12/04/22 1354     Visit Number 2    Number of Visits 12    Date for PT Re-Evaluation 01/09/23    Progress Note Due on Visit 10    PT Start Time 1884    PT Stop Time 1353    PT Time Calculation (min) 48 min    Activity Tolerance Patient tolerated treatment well;Patient limited by pain    Behavior During Therapy [facility] for tasks assessed/performed              Past Medical History:  Diagnosis Date   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   ANXIETY 12/27/2010   Arachnoiditis    ARTHRITIS 10/31/2007   Chronic lower back pain 10/28/2013   DEPRESSION 10/31/2007   FIBROMYALGIA 10/31/2007   Headache(784.0) 07/12/2009   HYPERLIPIDEMIA 10/31/2007   HYPERTENSION 10/31/2007   INSOMNIA 06/02/2010   LOW BACK PAIN 10/31/2007   Osteoporosis 10/31/2007   POLYARTHRALGIA 10/31/2007   RASH-NONVESICULAR 06/02/2010   SHINGLES 11/09/2009   SINUSITIS- ACUTE-NOS 12/20/2009   VITAMIN D DEFICIENCY 07/28/2009   Past Surgical History:  Procedure Laterality Date   ABDOMINAL HYSTERECTOMY     BREAST BIOPSY Left 02/26/2009   implant     neuro spine stimulator   LUMBAR FUSION  2008   s/p diskectomy  2004   L5   s/p right foot morton's neuroma  Jan. 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   Memory loss 03/25/2015   Chronic pain syndrome 03/25/2015   Memory dysfunction 02/16/2015   Nonallopathic lesion of cervical region 12/25/2014   Trigger point of left  shoulder region 12/05/2014   Whiplash injuries 12/04/2014   Acute hemorrhoid 10/04/2014   Fractured coccyx ([place]) 10/04/2014   Hormone replacement therapy 05/01/2014   Nonallopathic lesion of thoracic region 03/02/2014   Nonallopathic lesion of lumbosacral region 03/02/2014   Nonallopathic lesion of sacral region 03/02/2014   Trapezius muscle spasm 02/23/2014   Menopausal symptoms 10/28/2013   ANA positive 10/28/2013   Chronic lower back pain 10/28/2013   Varicose veins of lower extremities with other complications 16/60/6301   Varicose veins with pain 05/06/2013   Myalgia and myositis 04/30/2013   Abdominal pain, other specified site 09/16/2012   Polyarthralgia 09/16/2012   Palpitations 10/26/2011   Back pain 10/26/2011   Dyspepsia and other specified disorders of function of stomach 06/06/2011   Encounter for well adult exam with abnormal findings 02/27/2011   GERD (gastroesophageal reflux disease) 02/27/2011   ANXIETY 12/27/2010   INSOMNIA 06/02/2010   RASH-NONVESICULAR 06/02/2010   SHINGLES 11/09/2009   VITAMIN D DEFICIENCY 07/28/2009   Hyperlipidemia 10/31/2007   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   Depression 10/31/2007   Essential hypertension 10/31/2007   ARTHRITIS 10/31/2007   POLYARTHRALGIA 10/31/2007   LOW BACK PAIN 10/31/2007   Fibromyalgia 10/31/2007   Osteoporosis 10/31/2007    PCP: Leeroy Cha, MD   REFERRING PROVIDER: Murlean Iba, MD  REFERRING DIAG: [phone] (ICD-10-CM) - Fusion of spine, cervical region Lumbar spine and left leg  strengthening  THERAPY DIAG:  Cervicalgia  Other low back pain  Muscle weakness (generalized)  Rationale for Evaluation and Treatment: Rehabilitation  ONSET DATE: chronic neck and back pain, most recent surgery was neck fusion 08/09/22  SUBJECTIVE:                                                                                                                                                                                                          SUBJECTIVE STATEMENT: Pt arriving today reporting 4/10 pain between her shoulder blades. Pt said she saw Dr on 12/01/22 and they are going to schedule a MRI for her low back.   PERTINENT HISTORY:  YQM:VHQI 08/09/22, lumbar fusion, lumbar spinal stimulator, chronic pain, depression, fibromyalgia, osteoporosis  PAIN:  Are you having pain? Yes Pain scale: 4/10 Location: between shoulder blades  PRECAUTIONS: Cervical/Lumbar Fusion  WEIGHT BEARING RESTRICTIONS: No  FALLS:  Has patient fallen in last 6 months? Yes. Number of falls 1, tripped on curb and fell 11/26/22 on knee caps  and forearms so some left shoulder pain since thi  OCCUPATION: work from home  PLOF: Independent  PATIENT GOALS: reduce pain, feel better, turn her head more to the left  NEXT MD VISIT: 12/01/21  OBJECTIVE:   DIAGNOSTIC FINDINGS:  Will have XR when she sees MD again 12/01/21  PATIENT SURVEYS:  Eval: FOTO 52% functional, goal is 54%  COGNITION: Overall cognitive status: Within functional limits for tasks assessed  SENSATION: WFL  POSTURE: rounded shoulders  PALPATION: Eval: TTP in Cervical-thoracic-lumbar spine, in left shoulder and scapula, upper trap   CERVICAL ROM:   Active ROM AROM (deg) eval  Flexion 30  Extension 20  Right lateral flexion 20  Left lateral flexion 10  Right rotation 60  Left rotation 30   (Blank rows = not tested)  Lumbar ROM at EVAL Flexion: 50% Extension: 75% Rotation: WNL   UPPER EXTREMITY ROM:  Active ROM Right eval Left eval  Shoulder flexion 120 110  Shoulder extension    Shoulder abduction 120 110  Shoulder adduction    Shoulder extension    Shoulder internal rotation WNL WNL  Shoulder external rotation WNL WNL  Elbow flexion    Elbow extension    Wrist flexion    Wrist extension    Wrist ulnar deviation    Wrist radial deviation    Wrist pronation    Wrist supination     (Blank rows = not  tested)  UPPER/LOWER EXTREMITY MMT:  MMT Right eval Left eval  Shoulder flexion 4 4  Shoulder extension    Shoulder abduction 4 3  Shoulder adduction    Shoulder extension    Shoulder internal rotation 5 5  Shoulder external rotation 5       Hip flexion in sitting 4+ 3  Hip abd in sitting 4+ 4+  Knee extension 5 4-  Knee flexion 5 4  Wrist extension    Wrist ulnar deviation    Wrist radial deviation    Wrist pronation    Wrist supination    Grip strength     (Blank rows = not tested)  SPECIAL TESTS:  Spurling's test: Negative SLUMP test + on left, inconclusive on Rt  FUNCTIONAL TESTS:  12/04/21:  5 time sit to stand: 26 seconds c UE support  TODAY'S TREATMENT:  12/04/22: TherEx:  Nustep: Level 5 x 6 minutes Sit to stand: x 10 Cervical rotation with towel x 3 holding 10 sec Cervical side bending with self overpressure x 3 holding 10 sec Scapular retraction with ER x 10 holding 5 seconds using yellow theraband  Seated SLR x 10 bil LE's  Slump stretch x 5 each LE holding 5 seconds Manual:  STM to cervical paraspinals and occipitals in supine, active trigger point release to left levator and rhomboids to pt's tolerance  PROM to cervical spine Modalities;  Moist heat x 5 minutes to cervical and thoracic spine   Eval HEP creation and review with demonstration and trial set preformed, see below for details    PATIENT EDUCATION: Education details: HEP, PT plan of care Person educated: Patient Education method: Explanation, Demonstration, Verbal cues, and Handouts Education comprehension: verbalized understanding and needs further education   HOME EXERCISE PROGRAM: Access Code: [email or web address] URL: [email or web address] Date: 11/28/2022 Prepared by: Ivery Quale  Exercises - Seated Assisted Cervical Rotation with Towel  - 2 x daily - 6 x weekly - 1 sets - 10 reps - 5 hold - Seated Cervical Sidebending Stretch (Mirrored)  - 2 x daily - 6 x weekly - 1  sets - 3 reps - 20 sec hold - Standing Scapular Retraction with External Rotation  - 2 x daily - 6 x weekly - 1-2 sets - 10 reps - Slump Stretch  - 2 x daily - 6 x weekly - 1-2 sets - 10 reps - 3 hold - Seated Straight Leg Raise with Quad Contraction  - 2 x daily - 6 x weekly - 1-2 sets - 10 reps - Sit to Stand  - 2 x daily - 6 x weekly - 1-2 sets - 10 reps  ASSESSMENT:  CLINICAL IMPRESSION:  Pt arriving to clinic with 4/10 pain in her lower cervical and thoracic spine. Pt with pin point tenderness along left medial scapular border and left levator scapula. Active trigger point release, manual therapy and STM performed to pt's tolerance followed by moist heat. Pt reporting pain at end of treatment is more localized and not all over. Pt's HEP was reviewed and pt able to return demonstration. Continue skilled PT to maximize function.   OBJECTIVE IMPAIRMENTS: decreased activity tolerance, difficulty walking,  decreased endurance, decreased mobility, decreased ROM, decreased strength, impaired flexibility, impaired UE/LE use, postural dysfunction, and pain.  ACTIVITY LIMITATIONS: bending, lifting, carry, locomotion, cleaning, community activity, driving, and or occupation  PERSONAL FACTORS: MWN:UUVO 08/09/22, lumbar fusion, lumbar spinal stimulator, chronic pain, depression, fibromyalgia, osteoporosis are also affecting patient's functional outcome.  REHAB POTENTIAL: Good  CLINICAL DECISION MAKING: Evolving/moderate complexity  EVALUATION COMPLEXITY: Moderate    GOALS: Short term PT  Goals Target date: 12/26/2022   Pt will be I and compliant with HEP. Baseline:  Goal status: On-going 12/04/22 Pt will improve Left cervical rotation to 45 deg Baseline:30 Goal status: New  Long term PT goals Target date:01/09/2023   Pt will neck left cervical rotation ROM to 60 deg to improve scanning for driving Baseline: Goal status: New Pt will improve  Left hip/knee strength to at least 4/5 MMT to  improve functional strength Baseline:3 Goal status: New Pt will improve FOTO to at least 54% functional to show improved function Baseline: Goal status: New Pt will reduce pain by overall 50% overall with usual activity Baseline: Goal status: New Pt will reduce pain to overall less than 2-3/10 with usual activity, work, and sleeping Baseline: Goal status: New  PLAN: PT FREQUENCY: 1-2 times per week   PT DURATION: 6 weeks  PLANNED INTERVENTIONS (unless contraindicated): aquatic PT, Canalith repositioning, cryotherapy, Electrical stimulation, Iontophoresis with 4 mg/ml dexamethasome, Moist heat, traction, Ultrasound, gait training, Therapeutic exercise, balance training, neuromuscular re-education, patient/family education, prosthetic training, manual techniques, passive ROM, dry needling, taping, vasopnuematic device, vestibular, spinal manipulations, joint manipulations  PLAN FOR NEXT SESSION: update HEP as appropriate, needs left leg strengthening to tolerance, consider manual or DN to neck but history of fusions in neck/back   Sharmon Leyden, PT,MPT 12/04/2022, 1:56 PM

## 2022-12-06 ENCOUNTER — Ambulatory Visit: Payer: Medicare Other | Admitting: Physical Therapy

## 2022-12-06 ENCOUNTER — Encounter: Payer: Self-pay | Admitting: Physical Therapy

## 2022-12-06 DIAGNOSIS — M5459 Other low back pain: Secondary | ICD-10-CM

## 2022-12-06 DIAGNOSIS — M542 Cervicalgia: Secondary | ICD-10-CM

## 2022-12-06 NOTE — Therapy (Signed)
OUTPATIENT PHYSICAL THERAPY TREATMENT   Patient Name: Catherine Gardner MRN: [id number] DOB:August 30, 1961, 62 y.o., female Today's Date: 12/06/2022  END OF SESSION:  PT End of Session - 12/06/22 1015     Visit Number 3    Number of Visits 12    Date for PT Re-Evaluation 01/09/23    Progress Note Due on Visit 10    PT Start Time 0930    PT Stop Time 1011    PT Time Calculation (min) 41 min    Activity Tolerance Patient tolerated treatment well;Patient limited by pain    Behavior During Therapy [facility] for tasks assessed/performed               Past Medical History:  Diagnosis Date   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   ANXIETY 12/27/2010   Arachnoiditis    ARTHRITIS 10/31/2007   Chronic lower back pain 10/28/2013   DEPRESSION 10/31/2007   FIBROMYALGIA 10/31/2007   Headache(784.0) 07/12/2009   HYPERLIPIDEMIA 10/31/2007   HYPERTENSION 10/31/2007   INSOMNIA 06/02/2010   LOW BACK PAIN 10/31/2007   Osteoporosis 10/31/2007   POLYARTHRALGIA 10/31/2007   RASH-NONVESICULAR 06/02/2010   SHINGLES 11/09/2009   SINUSITIS- ACUTE-NOS 12/20/2009   VITAMIN D DEFICIENCY 07/28/2009   Past Surgical History:  Procedure Laterality Date   ABDOMINAL HYSTERECTOMY     BREAST BIOPSY Left 02/26/2009   implant     neuro spine stimulator   LUMBAR FUSION  2008   s/p diskectomy  2004   L5   s/p right foot morton's neuroma  Jan. 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   Memory loss 03/25/2015   Chronic pain syndrome 03/25/2015   Memory dysfunction 02/16/2015   Nonallopathic lesion of cervical region 12/25/2014   Trigger point of left  shoulder region 12/05/2014   Whiplash injuries 12/04/2014   Acute hemorrhoid 10/04/2014   Fractured coccyx ([place]) 10/04/2014   Hormone replacement therapy 05/01/2014   Nonallopathic lesion of thoracic region 03/02/2014   Nonallopathic lesion of lumbosacral region 03/02/2014   Nonallopathic lesion of sacral region 03/02/2014   Trapezius muscle spasm 02/23/2014   Menopausal symptoms 10/28/2013   ANA positive 10/28/2013   Chronic lower back pain 10/28/2013   Varicose veins of lower extremities with other complications 16/11/930   Varicose veins with pain 05/06/2013   Myalgia and myositis 04/30/2013   Abdominal pain, other specified site 09/16/2012   Polyarthralgia 09/16/2012   Palpitations 10/26/2011   Back pain 10/26/2011   Dyspepsia and other specified disorders of function of stomach 06/06/2011   Encounter for well adult exam with abnormal findings 02/27/2011   GERD (gastroesophageal reflux disease) 02/27/2011   ANXIETY 12/27/2010   INSOMNIA 06/02/2010   RASH-NONVESICULAR 06/02/2010   SHINGLES 11/09/2009   VITAMIN D DEFICIENCY 07/28/2009   Hyperlipidemia 10/31/2007   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   Depression 10/31/2007   Essential hypertension 10/31/2007   ARTHRITIS 10/31/2007   POLYARTHRALGIA 10/31/2007   LOW BACK PAIN 10/31/2007   Fibromyalgia 10/31/2007   Osteoporosis 10/31/2007    PCP: Leeroy Cha, MD   REFERRING PROVIDER: Murlean Iba, MD  REFERRING DIAG: [phone] (ICD-10-CM) - Fusion of spine, cervical region Lumbar spine and left  leg strengthening  THERAPY DIAG:  Cervicalgia  Other low back pain  Rationale for Evaluation and Treatment: Rehabilitation  ONSET DATE: chronic neck and back pain, most recent surgery was neck fusion 08/09/22  SUBJECTIVE:                                                                                                                                                                                                          SUBJECTIVE STATEMENT: Pt stating she was a little sore following her last session, but reporting overall good response. Pt arriving today with 2/10 pain.   PERTINENT HISTORY:  AYT:KZSW 08/09/22, lumbar fusion, lumbar spinal stimulator, chronic pain, depression, fibromyalgia, osteoporosis  PAIN:  Are you having pain? Yes Pain scale: 2/10 Location: between shoulder blades  PRECAUTIONS: Cervical/Lumbar Fusion  WEIGHT BEARING RESTRICTIONS: No  FALLS:  Has patient fallen in last 6 months? Yes. Number of falls 1, tripped on curb and fell 11/26/22 on knee caps  and forearms so some left shoulder pain since thi  OCCUPATION: work from home  PLOF: Independent  PATIENT GOALS: reduce pain, feel better, turn her head more to the left  NEXT MD VISIT: 12/01/21  OBJECTIVE:   DIAGNOSTIC FINDINGS:  Will have XR when she sees MD again 12/01/21  PATIENT SURVEYS:  Eval: FOTO 52% functional, goal is 54%  COGNITION: Overall cognitive status: Within functional limits for tasks assessed  SENSATION: WFL  POSTURE: rounded shoulders  PALPATION: Eval: TTP in Cervical-thoracic-lumbar spine, in left shoulder and scapula, upper trap   CERVICAL ROM:   Active ROM AROM (deg) eval  Flexion 30  Extension 20  Right lateral flexion 20  Left lateral flexion 10  Right rotation 60  Left rotation 30   (Blank rows = not tested)  Lumbar ROM at EVAL Flexion: 50% Extension: 75% Rotation: WNL   UPPER EXTREMITY ROM:  Active ROM Right eval Left eval  Shoulder flexion 120 110  Shoulder extension    Shoulder abduction 120 110  Shoulder adduction    Shoulder extension    Shoulder internal rotation WNL WNL  Shoulder external rotation WNL WNL  Elbow flexion    Elbow extension    Wrist flexion    Wrist extension    Wrist ulnar deviation    Wrist radial deviation    Wrist pronation    Wrist supination     (Blank rows = not tested)  UPPER/LOWER EXTREMITY MMT:  MMT Right eval  Left eval  Shoulder flexion 4 4  Shoulder extension    Shoulder abduction 4 3  Shoulder adduction    Shoulder extension    Shoulder internal rotation 5 5  Shoulder external rotation 5       Hip flexion in sitting 4+ 3  Hip abd in sitting 4+ 4+  Knee extension 5 4-  Knee flexion 5 4  Wrist extension    Wrist ulnar deviation    Wrist radial deviation    Wrist pronation    Wrist supination    Grip strength     (Blank rows = not tested)  SPECIAL TESTS:  Spurling's test: Negative SLUMP test + on left, inconclusive on Rt  FUNCTIONAL TESTS:  12/04/21:  5 time sit to stand: 26 seconds c UE support  TODAY'S TREATMENT:  12/06/22 TherEx:  UBE: Level 2  x 2.5 minutes each direction Rows: x 2 x 10 Level 3 band Shoulder extension: 2 x 10 Level 2 band Door stretch: 90 deg, x 3 holding 20 seconds Supine PNF Diagonal 1 Level 2 band following with head x 10 each UE Supine shoulder flexion c 2 # bar x 15  Manual:  STM to cervical paraspinals in prone active trigger point release to left levator and bilateral rhomboids to pt's tolerance using Biofreeze     12/04/22: TherEx:  Nustep: Level 5 x 6 minutes Sit to stand: x 10 Cervical rotation with towel x 3 holding 10 sec Cervical side bending with self overpressure x 3 holding 10 sec Scapular retraction with ER x 10 holding 5 seconds using yellow theraband  Seated SLR x 10 bil LE's  Slump stretch x 5 each LE holding 5 seconds Manual:  STM to cervical paraspinals and occipitals in supine, active trigger point release to left levator and rhomboids to pt's tolerance  PROM to cervical spine Modalities;  Moist heat x 5 minutes to cervical and thoracic spine   Eval HEP creation and review with demonstration and trial set preformed, see below for details    PATIENT EDUCATION: Education details: HEP, PT plan of care Person educated: Patient Education method: Explanation, Demonstration, Verbal cues, and Handouts Education  comprehension: verbalized understanding and needs further education   HOME EXERCISE PROGRAM: Access Code: [email or web address] URL: [email or web address] Date: 11/28/2022 Prepared by: Elsie Ra  Exercises - Seated Assisted Cervical Rotation with Towel  - 2 x daily - 6 x weekly - 1 sets - 10 reps - 5 hold - Seated Cervical Sidebending Stretch (Mirrored)  - 2 x daily - 6 x weekly - 1 sets - 3 reps - 20 sec hold - Standing Scapular Retraction with External Rotation  - 2 x daily - 6 x weekly - 1-2 sets - 10 reps - Slump Stretch  - 2 x daily - 6 x weekly - 1-2 sets - 10 reps - 3 hold - Seated Straight Leg Raise with Quad Contraction  - 2 x daily - 6 x weekly - 1-2 sets - 10 reps - Sit to Stand  - 2 x daily - 6 x weekly - 1-2 sets - 10 reps  ASSESSMENT:  CLINICAL IMPRESSION:  Pt arriving to clinic today with 2/10 pain in her thoracic spine and lower cervical spine. Pt with good tolerance to last treatment session with pt reporting mild soreness but overall decrease in pain. Pt tolerating exercises well today ending with good response to manual therapy. Continue skilled PT to maximize pt's function.   OBJECTIVE IMPAIRMENTS: decreased activity tolerance, difficulty walking,  decreased endurance, decreased mobility, decreased ROM, decreased strength, impaired flexibility, impaired UE/LE use, postural dysfunction, and  pain.  ACTIVITY LIMITATIONS: bending, lifting, carry, locomotion, cleaning, community activity, driving, and or occupation  PERSONAL FACTORS: VOH:YWVP 08/09/22, lumbar fusion, lumbar spinal stimulator, chronic pain, depression, fibromyalgia, osteoporosis are also affecting patient's functional outcome.  REHAB POTENTIAL: Good  CLINICAL DECISION MAKING: Evolving/moderate complexity  EVALUATION COMPLEXITY: Moderate    GOALS: Short term PT Goals Target date: 12/26/2022   Pt will be I and compliant with HEP. Baseline:  Goal status: On-going 12/06/22 Pt will improve Left  cervical rotation to 45 deg Baseline:30 Goal status: New  Long term PT goals Target date:01/09/2023   Pt will neck left cervical rotation ROM to 60 deg to improve scanning for driving Baseline: Goal status: New Pt will improve  Left hip/knee strength to at least 4/5 MMT to improve functional strength Baseline:3 Goal status: New Pt will improve FOTO to at least 54% functional to show improved function Baseline: Goal status: New Pt will reduce pain by overall 50% overall with usual activity Baseline: Goal status: New Pt will reduce pain to overall less than 2-3/10 with usual activity, work, and sleeping Baseline: Goal status: New  PLAN: PT FREQUENCY: 1-2 times per week   PT DURATION: 6 weeks  PLANNED INTERVENTIONS (unless contraindicated): aquatic PT, Canalith repositioning, cryotherapy, Electrical stimulation, Iontophoresis with 4 mg/ml dexamethasome, Moist heat, traction, Ultrasound, gait training, Therapeutic exercise, balance training, neuromuscular re-education, patient/family education, prosthetic training, manual techniques, passive ROM, dry needling, taping, vasopnuematic device, vestibular, spinal manipulations, joint manipulations  PLAN FOR NEXT SESSION: update HEP as appropriate, needs left leg strengthening to tolerance, consider manual or DN to neck but history of fusions in neck/back   Sharmon Leyden, PT, MPT 12/06/2022, 10:16 AM

## 2022-12-07 ENCOUNTER — Other Ambulatory Visit: Payer: Self-pay | Admitting: Internal Medicine

## 2022-12-07 DIAGNOSIS — Z1231 Encounter for screening mammogram for malignant neoplasm of breast: Secondary | ICD-10-CM

## 2022-12-11 ENCOUNTER — Ambulatory Visit: Payer: Medicare Other | Admitting: Physical Therapy

## 2022-12-11 ENCOUNTER — Encounter: Payer: Self-pay | Admitting: Physical Therapy

## 2022-12-11 DIAGNOSIS — M542 Cervicalgia: Secondary | ICD-10-CM

## 2022-12-11 DIAGNOSIS — M6281 Muscle weakness (generalized): Secondary | ICD-10-CM

## 2022-12-11 DIAGNOSIS — M5459 Other low back pain: Secondary | ICD-10-CM | POA: Diagnosis not present

## 2022-12-11 NOTE — Therapy (Signed)
OUTPATIENT PHYSICAL THERAPY TREATMENT   Patient Name: Catherine Gardner MRN: [id number] DOB:06/19/1961, 62 y.o., female Today's Date: 12/11/2022  END OF SESSION:  PT End of Session - 12/11/22 1151     Visit Number 4    Number of Visits 12    Date for PT Re-Evaluation 01/09/23    Progress Note Due on Visit 10    PT Start Time 1146    PT Stop Time 1226    PT Time Calculation (min) 40 min    Activity Tolerance Patient tolerated treatment well;Patient limited by pain    Behavior During Therapy [facility] for tasks assessed/performed                Past Medical History:  Diagnosis Date   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   ANXIETY 12/27/2010   Arachnoiditis    ARTHRITIS 10/31/2007   Chronic lower back pain 10/28/2013   DEPRESSION 10/31/2007   FIBROMYALGIA 10/31/2007   Headache(784.0) 07/12/2009   HYPERLIPIDEMIA 10/31/2007   HYPERTENSION 10/31/2007   INSOMNIA 06/02/2010   LOW BACK PAIN 10/31/2007   Osteoporosis 10/31/2007   POLYARTHRALGIA 10/31/2007   RASH-NONVESICULAR 06/02/2010   SHINGLES 11/09/2009   SINUSITIS- ACUTE-NOS 12/20/2009   VITAMIN D DEFICIENCY 07/28/2009   Past Surgical History:  Procedure Laterality Date   ABDOMINAL HYSTERECTOMY     BREAST BIOPSY Left 02/26/2009   implant     neuro spine stimulator   LUMBAR FUSION  2008   s/p diskectomy  2004   L5   s/p right foot morton's neuroma  Jan. 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   Memory loss 03/25/2015   Chronic pain syndrome 03/25/2015   Memory dysfunction 02/16/2015   Nonallopathic lesion of cervical region 12/25/2014   Trigger point of left  shoulder region 12/05/2014   Whiplash injuries 12/04/2014   Acute hemorrhoid 10/04/2014   Fractured coccyx ([facility]) 10/04/2014   Hormone replacement therapy 05/01/2014   Nonallopathic lesion of thoracic region 03/02/2014   Nonallopathic lesion of lumbosacral region 03/02/2014   Nonallopathic lesion of sacral region 03/02/2014   Trapezius muscle spasm 02/23/2014   Menopausal symptoms 10/28/2013   ANA positive 10/28/2013   Chronic lower back pain 10/28/2013   Varicose veins of lower extremities with other complications 05/15/2013   Varicose veins with pain 05/06/2013   Myalgia and myositis 04/30/2013   Abdominal pain, other specified site 09/16/2012   Polyarthralgia 09/16/2012   Palpitations 10/26/2011   Back pain 10/26/2011   Dyspepsia and other specified disorders of function of stomach 06/06/2011   Encounter for well adult exam with abnormal findings 02/27/2011   GERD (gastroesophageal reflux disease) 02/27/2011   ANXIETY 12/27/2010   INSOMNIA 06/02/2010   RASH-NONVESICULAR 06/02/2010   SHINGLES 11/09/2009   VITAMIN D DEFICIENCY 07/28/2009   Hyperlipidemia 10/31/2007   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   Depression 10/31/2007   Essential hypertension 10/31/2007   ARTHRITIS 10/31/2007   POLYARTHRALGIA 10/31/2007   LOW BACK PAIN 10/31/2007   Fibromyalgia 10/31/2007   Osteoporosis 10/31/2007    PCP: Lorenda Ishihara, MD   REFERRING PROVIDER: Adelfa Koh, MD  REFERRING DIAG: [phone] (ICD-10-CM) - Fusion of spine, cervical region Lumbar spine and  left leg strengthening  THERAPY DIAG:  Cervicalgia  Other low back pain  Muscle weakness (generalized)  Rationale for Evaluation and Treatment: Rehabilitation  ONSET DATE: chronic neck and back pain, most recent surgery was neck fusion 08/09/22  SUBJECTIVE:                                                                                                                                                                                                          SUBJECTIVE STATEMENT: Pt stating more achy today with the cold damp weather   PERTINENT HISTORY:  QMG:QQPY 08/09/22, lumbar fusion, lumbar spinal stimulator, chronic pain, depression, fibromyalgia, osteoporosis  PAIN:  Are you having pain? Yes Pain scale: 3/10 Location: between shoulder blades  PRECAUTIONS: Cervical/Lumbar Fusion  WEIGHT BEARING RESTRICTIONS: No  FALLS:  Has patient fallen in last 6 months? Yes. Number of falls 1, tripped on curb and fell 11/26/22 on knee caps  and forearms so some left shoulder pain since thi  OCCUPATION: work from home  PLOF: Independent  PATIENT GOALS: reduce pain, feel better, turn her head more to the left  NEXT MD VISIT: 12/01/21  OBJECTIVE:   DIAGNOSTIC FINDINGS:  Will have XR when she sees MD again 12/01/21  PATIENT SURVEYS:  Eval: FOTO 52% functional, goal is 54%  COGNITION: Overall cognitive status: Within functional limits for tasks assessed  SENSATION: WFL  POSTURE: rounded shoulders  PALPATION: Eval: TTP in Cervical-thoracic-lumbar spine, in left shoulder and scapula, upper trap   CERVICAL ROM:   Active ROM AROM (deg) eval AROM 12/11/22  Flexion 30 42  Extension 20 35  Right lateral flexion 20 28  Left lateral flexion 10 30  Right rotation 60 68  Left rotation 30 48   (Blank rows = not tested)  Lumbar ROM at EVAL Flexion: 50% Extension: 75% Rotation: WNL   UPPER EXTREMITY ROM:  Active ROM Right eval Left eval  Shoulder flexion 120 110  Shoulder extension    Shoulder abduction 120 110  Shoulder adduction    Shoulder extension    Shoulder internal rotation WNL WNL  Shoulder external rotation WNL WNL  Elbow flexion    Elbow extension    Wrist flexion    Wrist extension    Wrist ulnar deviation    Wrist radial deviation    Wrist pronation    Wrist supination     (Blank rows = not tested)  UPPER/LOWER EXTREMITY MMT:  MMT Right eval Left eval   Shoulder flexion 4 4  Shoulder extension    Shoulder abduction 4 3  Shoulder adduction    Shoulder extension    Shoulder internal rotation 5 5  Shoulder external rotation 5       Hip flexion in sitting 4+ 3  Hip abd in sitting 4+ 4+  Knee extension 5 4-  Knee flexion 5 4  Wrist extension    Wrist ulnar deviation    Wrist radial deviation    Wrist pronation    Wrist supination    Grip strength     (Blank rows = not tested)  SPECIAL TESTS:  Spurling's test: Negative SLUMP test + on left, inconclusive on Rt  FUNCTIONAL TESTS:  12/04/21:  5 time sit to stand: 26 seconds c UE support  TODAY'S TREATMENT:  12/11/22 TherEx:  Nu step L5  X10 min UE/LE seat #6 Neck rotation AROM (held AAROM due to pain/pinch)  X10 bilat Neck extension AAROM 5 sec X 10 Neck sidebending AROM 5 sec  X10 Rows: x 2 x 10 Level 3 band Shoulder extension: 2 x 10 Level 3 band Bilat shoulder ER with scap retraction L3 band 2X10 Sitting shoulder flexion c 2 # bar 2 X 10 Manual:  STM and Cupping to upper traps, cervical paraspinals bilat  12/06/22 TherEx:  UBE: Level 2  x 2.5 minutes each direction Rows: x 2 x 10 Level 3 band Shoulder extension: 2 x 10 Level 2 band Door stretch: 90 deg, x 3 holding 20 seconds Supine PNF Diagonal 1 Level 2 band following with head x 10 each UE Supine shoulder flexion c 2 # bar x 15  Manual:  STM to cervical paraspinals in prone active trigger point release to left levator and bilateral rhomboids to pt's tolerance using Biofreeze     12/04/22: TherEx:  Nustep: Level 5 x 6 minutes Sit to stand: x 10 Cervical rotation with towel x 3 holding 10 sec Cervical side bending with self overpressure x 3 holding 10 sec Scapular retraction with ER x 10 holding 5 seconds using yellow theraband  Seated SLR x 10 bil LE's  Slump stretch x 5 each LE holding 5 seconds Manual:  STM to cervical paraspinals and occipitals in supine, active trigger point release to left levator and  rhomboids to pt's tolerance  PROM to cervical spine Modalities;  Moist heat x 5 minutes to cervical and thoracic spine   Eval HEP creation and review with demonstration and trial set preformed, see below for details    PATIENT EDUCATION: Education details: HEP, PT plan of care Person educated: Patient Education method: Explanation, Demonstration, Verbal cues, and Handouts Education comprehension: verbalized understanding and needs further education   HOME EXERCISE PROGRAM: Access Code: [email or web address] URL: [email or web address] Date: 11/28/2022 Prepared by: Elsie Ra  Exercises - Seated Assisted Cervical Rotation with Towel  - 2 x daily - 6 x weekly - 1 sets - 10 reps - 5 hold - Seated Cervical Sidebending Stretch (Mirrored)  - 2 x daily - 6 x weekly - 1 sets - 3 reps - 20 sec hold - Standing Scapular Retraction with External Rotation  - 2 x daily - 6 x weekly - 1-2 sets - 10 reps - Slump Stretch  - 2 x daily - 6 x weekly - 1-2 sets - 10 reps - 3 hold - Seated Straight Leg Raise with Quad Contraction  - 2 x daily - 6 x weekly - 1-2 sets - 10 reps - Sit to Stand  - 2 x daily - 6 x weekly - 1-2 sets - 10 reps  ASSESSMENT:  CLINICAL IMPRESSION:  She showed improvements in neck ROM noted today with updated measurements. She is still very tender to palpation in her neck and upper traps so treated her with soft tissue work and cupping therapy to see if this helps with any of her pain and tightness.    OBJECTIVE IMPAIRMENTS: decreased activity tolerance, difficulty walking,  decreased endurance, decreased mobility, decreased ROM, decreased strength, impaired flexibility, impaired UE/LE use, postural dysfunction, and pain.  ACTIVITY LIMITATIONS: bending, lifting, carry, locomotion, cleaning, community activity, driving, and or occupation  PERSONAL FACTORS: EXN:TZGY 08/09/22, lumbar fusion, lumbar spinal stimulator, chronic pain, depression, fibromyalgia, osteoporosis are  also affecting patient's functional outcome.  REHAB POTENTIAL: Good  CLINICAL DECISION MAKING: Evolving/moderate complexity  EVALUATION COMPLEXITY: Moderate    GOALS: Short term PT Goals Target date: 12/26/2022   Pt will be I and compliant with HEP. Baseline:  Goal status: On-going 12/06/22 Pt will improve Left cervical rotation to 45 deg Baseline:30 Goal status: New  Long term PT goals Target date:01/09/2023   Pt will neck left cervical rotation ROM to 60 deg to improve scanning for driving Baseline: Goal status: New Pt will improve  Left hip/knee strength to at least 4/5 MMT to improve functional strength Baseline:3 Goal status: New Pt will improve FOTO to at least 54% functional to show improved function Baseline: Goal status: New Pt will reduce pain by overall 50% overall with usual activity Baseline: Goal status: New Pt will reduce pain to overall less than 2-3/10 with usual activity, work, and sleeping Baseline: Goal status: New  PLAN: PT FREQUENCY: 1-2 times per week   PT DURATION: 6 weeks  PLANNED INTERVENTIONS (unless contraindicated): aquatic PT, Canalith repositioning, cryotherapy, Electrical stimulation, Iontophoresis with 4 mg/ml dexamethasome, Moist heat, traction, Ultrasound, gait training, Therapeutic exercise, balance training, neuromuscular re-education, patient/family education, prosthetic training, manual techniques, passive ROM, dry needling, taping, vasopnuematic device, vestibular, spinal manipulations, joint manipulations  PLAN FOR NEXT SESSION: neck and posture strengthening, neck ROM, needs left leg strengthening to tolerance, consider manual and check how cupping went.  History of fusions in neck/back   Debbe Odea, PT, DPT 12/11/2022, 1:08 PM

## 2022-12-14 ENCOUNTER — Inpatient Hospital Stay: Admission: RE | Admit: 2022-12-14 | Payer: Medicare Other | Source: Ambulatory Visit

## 2022-12-14 ENCOUNTER — Ambulatory Visit
Admission: RE | Admit: 2022-12-14 | Discharge: 2022-12-14 | Disposition: A | Discharge status: Home / Self Care | Payer: Medicare Other | Source: Ambulatory Visit | Attending: Internal Medicine | Admitting: Internal Medicine

## 2022-12-14 DIAGNOSIS — Z1231 Encounter for screening mammogram for malignant neoplasm of breast: Secondary | ICD-10-CM

## 2022-12-18 ENCOUNTER — Ambulatory Visit: Payer: Medicare Other | Admitting: Physical Therapy

## 2022-12-18 ENCOUNTER — Encounter: Payer: Self-pay | Admitting: Physical Therapy

## 2022-12-18 ENCOUNTER — Other Ambulatory Visit: Payer: Self-pay | Admitting: Internal Medicine

## 2022-12-18 DIAGNOSIS — M542 Cervicalgia: Secondary | ICD-10-CM | POA: Diagnosis not present

## 2022-12-18 DIAGNOSIS — M5459 Other low back pain: Secondary | ICD-10-CM | POA: Diagnosis not present

## 2022-12-18 DIAGNOSIS — R928 Other abnormal and inconclusive findings on diagnostic imaging of breast: Secondary | ICD-10-CM

## 2022-12-18 DIAGNOSIS — M6281 Muscle weakness (generalized): Secondary | ICD-10-CM

## 2022-12-18 NOTE — Therapy (Signed)
OUTPATIENT PHYSICAL THERAPY TREATMENT   Patient Name: Catherine Gardner MRN: [id number] DOB:Oct 17, 1961, 62 y.o., female Today's Date: 12/18/2022  END OF SESSION:  PT End of Session - 12/18/22 1114     Visit Number 5    Number of Visits 12    Date for PT Re-Evaluation 01/09/23    Progress Note Due on Visit 10    PT Start Time 1100    PT Stop Time 1140    PT Time Calculation (min) 40 min    Activity Tolerance Patient tolerated treatment well;Patient limited by pain    Behavior During Therapy [facility] for tasks assessed/performed                 Past Medical History:  Diagnosis Date   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   ANXIETY 12/27/2010   Arachnoiditis    ARTHRITIS 10/31/2007   Chronic lower back pain 10/28/2013   DEPRESSION 10/31/2007   FIBROMYALGIA 10/31/2007   Headache(784.0) 07/12/2009   HYPERLIPIDEMIA 10/31/2007   HYPERTENSION 10/31/2007   INSOMNIA 06/02/2010   LOW BACK PAIN 10/31/2007   Osteoporosis 10/31/2007   POLYARTHRALGIA 10/31/2007   RASH-NONVESICULAR 06/02/2010   SHINGLES 11/09/2009   SINUSITIS- ACUTE-NOS 12/20/2009   VITAMIN D DEFICIENCY 07/28/2009   Past Surgical History:  Procedure Laterality Date   ABDOMINAL HYSTERECTOMY     BREAST BIOPSY Left 02/26/2009   implant     neuro spine stimulator   LUMBAR FUSION  2008   s/p diskectomy  2004   L5   s/p right foot morton's neuroma  Jan. 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   Memory loss 03/25/2015   Chronic pain syndrome 03/25/2015   Memory dysfunction 02/16/2015   Nonallopathic lesion of cervical region 12/25/2014   Trigger point of  left shoulder region 12/05/2014   Whiplash injuries 12/04/2014   Acute hemorrhoid 10/04/2014   Fractured coccyx ([place]) 10/04/2014   Hormone replacement therapy 05/01/2014   Nonallopathic lesion of thoracic region 03/02/2014   Nonallopathic lesion of lumbosacral region 03/02/2014   Nonallopathic lesion of sacral region 03/02/2014   Trapezius muscle spasm 02/23/2014   Menopausal symptoms 10/28/2013   ANA positive 10/28/2013   Chronic lower back pain 10/28/2013   Varicose veins of lower extremities with other complications 60/73/7106   Varicose veins with pain 05/06/2013   Myalgia and myositis 04/30/2013   Abdominal pain, other specified site 09/16/2012   Polyarthralgia 09/16/2012   Palpitations 10/26/2011   Back pain 10/26/2011   Dyspepsia and other specified disorders of function of stomach 06/06/2011   Encounter for well adult exam with abnormal findings 02/27/2011   GERD (gastroesophageal reflux disease) 02/27/2011   ANXIETY 12/27/2010   INSOMNIA 06/02/2010   RASH-NONVESICULAR 06/02/2010   SHINGLES 11/09/2009   VITAMIN D DEFICIENCY 07/28/2009   Hyperlipidemia 10/31/2007   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   Depression 10/31/2007   Essential hypertension 10/31/2007   ARTHRITIS 10/31/2007   POLYARTHRALGIA 10/31/2007   LOW BACK PAIN 10/31/2007   Fibromyalgia 10/31/2007   Osteoporosis 10/31/2007    PCP: Leeroy Cha, MD   REFERRING PROVIDER: Murlean Iba, MD  REFERRING DIAG: [phone] (ICD-10-CM) - Fusion of spine, cervical region Lumbar spine  and left leg strengthening  THERAPY DIAG:  Cervicalgia  Other low back pain  Muscle weakness (generalized)  Rationale for Evaluation and Treatment: Rehabilitation  ONSET DATE: chronic neck and back pain, most recent surgery was neck fusion 08/09/22  SUBJECTIVE:                                                                                                                                                                                                          SUBJECTIVE STATEMENT: Pt stating she did not like the cupping last time, wants to defer this today. Has more Rt sided neck/thoracic pain today. She gets a catch in the left side of her neck when she tries to rotate or sidebend to that side and this bothers her driving.  PERTINENT HISTORY:  AYT:KZSW 08/09/22, lumbar fusion, lumbar spinal stimulator, chronic pain, depression, fibromyalgia, osteoporosis  PAIN:  Are you having pain? Yes Pain scale: 4/10 Location: between shoulder blades  PRECAUTIONS: Cervical/Lumbar Fusion  WEIGHT BEARING RESTRICTIONS: No  FALLS:  Has patient fallen in last 6 months? Yes. Number of falls 1, tripped on curb and fell 11/26/22 on knee caps  and forearms so some left shoulder pain since thi  OCCUPATION: work from home  PLOF: Independent  PATIENT GOALS: reduce pain, feel better, turn her head more to the left  NEXT MD VISIT: 12/01/21  OBJECTIVE:   DIAGNOSTIC FINDINGS:  Will have XR when she sees MD again 12/01/21  PATIENT SURVEYS:  Eval: FOTO 52% functional, goal is 54%  COGNITION: Overall cognitive status: Within functional limits for tasks assessed  SENSATION: WFL  POSTURE: rounded shoulders  PALPATION: Eval: TTP in Cervical-thoracic-lumbar spine, in left shoulder and scapula, upper trap   CERVICAL ROM:   Active ROM AROM (deg) eval AROM 12/11/22  Flexion 30 42  Extension 20 35  Right lateral flexion 20 28  Left lateral flexion 10 30  Right rotation 60 68  Left rotation 30 48   (Blank rows = not tested)  Lumbar ROM at EVAL Flexion: 50% Extension: 75% Rotation: WNL   UPPER EXTREMITY ROM:  Active ROM Right eval Left eval  Shoulder flexion 120 110  Shoulder extension    Shoulder abduction 120 110  Shoulder adduction    Shoulder extension    Shoulder internal rotation WNL WNL  Shoulder external rotation WNL WNL  Elbow flexion    Elbow extension    Wrist flexion     Wrist extension    Wrist ulnar deviation    Wrist radial deviation    Wrist pronation  Wrist supination     (Blank rows = not tested)  UPPER/LOWER EXTREMITY MMT:  MMT Right eval Left eval  Shoulder flexion 4 4  Shoulder extension    Shoulder abduction 4 3  Shoulder adduction    Shoulder extension    Shoulder internal rotation 5 5  Shoulder external rotation 5       Hip flexion in sitting 4+ 3  Hip abd in sitting 4+ 4+  Knee extension 5 4-  Knee flexion 5 4  Wrist extension    Wrist ulnar deviation    Wrist radial deviation    Wrist pronation    Wrist supination    Grip strength     (Blank rows = not tested)  SPECIAL TESTS:  Spurling's test: Negative SLUMP test + on left, inconclusive on Rt  FUNCTIONAL TESTS:  12/04/21:  5 time sit to stand: 26 seconds c UE support  TODAY'S TREATMENT:  12/18/22 TherEx:  Nu step L5  X10 min UE/LE seat #6 Neck rotation AROM (has catch with this so performed it passively in supine instead for left, but she can do it to the right without complaints) X 10 Neck sidebending AROM (has catch with this so performed it passively in supine instead for left, but she can do it to the right without complaints) X 10 Rows: x 2 x 10 Level 3 band Shoulder extension: 2 x 10 Level 3 band Bilat shoulder ER with scap retraction L3 band 2X10 Standing P ball roll up wall into bilateral shoulder flexion and thoracic extension 5 sec hold X 10 UE Ranger cirlces X 10 CW and CCW on Rt  Manual therapy: neck PROM rotation and sidebending, gentle cervical distraction  Modalities: moist heat to neck X 7 min in supine, not included in treatment billing time  12/11/22 TherEx:  Nu step L5  X10 min UE/LE seat #6 Neck rotation AROM (held AAROM due to pain/pinch)  X10 bilat Neck extension AAROM 5 sec X 10 Neck sidebending AROM 5 sec  X10 Rows: x 2 x 10 Level 3 band Shoulder extension: 2 x 10 Level 3 band Bilat shoulder ER with scap retraction L3 band  2X10 Sitting shoulder flexion c 2 # bar 2 X 10 Manual:  STM and Cupping to upper traps, cervical paraspinals bilat  12/06/22 TherEx:  UBE: Level 2  x 2.5 minutes each direction Rows: x 2 x 10 Level 3 band Shoulder extension: 2 x 10 Level 2 band Door stretch: 90 deg, x 3 holding 20 seconds Supine PNF Diagonal 1 Level 2 band following with head x 10 each UE Supine shoulder flexion c 2 # bar x 15  Manual:  STM to cervical paraspinals in prone active trigger point release to left levator and bilateral rhomboids to pt's tolerance using Biofreeze    PATIENT EDUCATION: Education details: HEP, PT plan of care Person educated: Patient Education method: Explanation, Demonstration, Verbal cues, and Handouts Education comprehension: verbalized understanding and needs further education   HOME EXERCISE PROGRAM: Access Code: [email or web address] URL: [email or web address] Date: 11/28/2022 Prepared by: Ivery Quale  Exercises - Seated Assisted Cervical Rotation with Towel  - 2 x daily - 6 x weekly - 1 sets - 10 reps - 5 hold - Seated Cervical Sidebending Stretch (Mirrored)  - 2 x daily - 6 x weekly - 1 sets - 3 reps - 20 sec hold - Standing Scapular Retraction with External Rotation  - 2 x daily - 6 x weekly - 1-2 sets - 10 reps -  Slump Stretch  - 2 x daily - 6 x weekly - 1-2 sets - 10 reps - 3 hold - Seated Straight Leg Raise with Quad Contraction  - 2 x daily - 6 x weekly - 1-2 sets - 10 reps - Sit to Stand  - 2 x daily - 6 x weekly - 1-2 sets - 10 reps  ASSESSMENT:  CLINICAL IMPRESSION:  She has catch in her neck on left side moving into Left rotation and moving into left sidebend . She does not get this with PROM in supine so PT suspects this is something muscular and we will continue to try to work out with manual therapy, stretching, and strengthening exercises.   OBJECTIVE IMPAIRMENTS: decreased activity tolerance, difficulty walking,  decreased endurance, decreased mobility,  decreased ROM, decreased strength, impaired flexibility, impaired UE/LE use, postural dysfunction, and pain.  ACTIVITY LIMITATIONS: bending, lifting, carry, locomotion, cleaning, community activity, driving, and or occupation  PERSONAL FACTORS: JOI:NOMV 08/09/22, lumbar fusion, lumbar spinal stimulator, chronic pain, depression, fibromyalgia, osteoporosis are also affecting patient's functional outcome.  REHAB POTENTIAL: Good  CLINICAL DECISION MAKING: Evolving/moderate complexity  EVALUATION COMPLEXITY: Moderate    GOALS: Short term PT Goals Target date: 12/26/2022   Pt will be I and compliant with HEP. Baseline:  Goal status: On-going 12/06/22 Pt will improve Left cervical rotation to 45 deg Baseline:30 Goal status: New  Long term PT goals Target date:01/09/2023   Pt will neck left cervical rotation ROM to 60 deg to improve scanning for driving Baseline: Goal status: New Pt will improve  Left hip/knee strength to at least 4/5 MMT to improve functional strength Baseline:3 Goal status: New Pt will improve FOTO to at least 54% functional to show improved function Baseline: Goal status: New Pt will reduce pain by overall 50% overall with usual activity Baseline: Goal status: New Pt will reduce pain to overall less than 2-3/10 with usual activity, work, and sleeping Baseline: Goal status: New  PLAN: PT FREQUENCY: 1-2 times per week   PT DURATION: 6 weeks  PLANNED INTERVENTIONS (unless contraindicated): aquatic PT, Canalith repositioning, cryotherapy, Electrical stimulation, Iontophoresis with 4 mg/ml dexamethasome, Moist heat, traction, Ultrasound, gait training, Therapeutic exercise, balance training, neuromuscular re-education, patient/family education, prosthetic training, manual techniques, passive ROM, dry needling, taping, vasopnuematic device, vestibular, spinal manipulations, joint manipulations  PLAN FOR NEXT SESSION: neck and posture strengthening, neck ROM,  needs left leg strengthening to tolerance, consider manual and check how cupping went.  History of fusions in neck/back   Debbe Odea, PT, DPT 12/18/2022, 1:11 PM

## 2022-12-19 DIAGNOSIS — I10 Essential (primary) hypertension: Secondary | ICD-10-CM | POA: Diagnosis not present

## 2022-12-21 ENCOUNTER — Ambulatory Visit
Admission: RE | Admit: 2022-12-21 | Discharge: 2022-12-21 | Disposition: A | Discharge status: Home / Self Care | Payer: Medicare Other | Source: Ambulatory Visit | Attending: Internal Medicine | Admitting: Internal Medicine

## 2022-12-21 ENCOUNTER — Ambulatory Visit: Payer: Medicare Other | Admitting: Physical Therapy

## 2022-12-21 ENCOUNTER — Encounter: Payer: Self-pay | Admitting: Physical Therapy

## 2022-12-21 DIAGNOSIS — M6281 Muscle weakness (generalized): Secondary | ICD-10-CM | POA: Diagnosis not present

## 2022-12-21 DIAGNOSIS — R928 Other abnormal and inconclusive findings on diagnostic imaging of breast: Secondary | ICD-10-CM

## 2022-12-21 DIAGNOSIS — M5459 Other low back pain: Secondary | ICD-10-CM

## 2022-12-21 DIAGNOSIS — M542 Cervicalgia: Secondary | ICD-10-CM | POA: Diagnosis not present

## 2022-12-21 DIAGNOSIS — R921 Mammographic calcification found on diagnostic imaging of breast: Secondary | ICD-10-CM | POA: Diagnosis not present

## 2022-12-21 NOTE — Therapy (Addendum)
OUTPATIENT PHYSICAL THERAPY TREATMENT   Patient Name: Catherine Gardner MRN: [id number] DOB:[birth year], 62 y.o., female Today's Date: 12/21/2022  END OF SESSION:  PT End of Session - 12/21/22 1202     Visit Number 6    Number of Visits 12    Date for PT Re-Evaluation 01/09/23    Progress Note Due on Visit 10    PT Start Time 1308    PT Stop Time 1230    PT Time Calculation (min) 45 min    Activity Tolerance Patient tolerated treatment well;Patient limited by pain    Behavior During Therapy [facility] for tasks assessed/performed                 Past Medical History:  Diagnosis Date   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   ANXIETY 12/27/2010   Arachnoiditis    ARTHRITIS 10/31/2007   Chronic lower back pain 10/28/2013   DEPRESSION 10/31/2007   FIBROMYALGIA 10/31/2007   Headache(784.0) 07/12/2009   HYPERLIPIDEMIA 10/31/2007   HYPERTENSION 10/31/2007   INSOMNIA 06/02/2010   LOW BACK PAIN 10/31/2007   Osteoporosis 10/31/2007   POLYARTHRALGIA 10/31/2007   RASH-NONVESICULAR 06/02/2010   SHINGLES 11/09/2009   SINUSITIS- ACUTE-NOS 12/20/2009   VITAMIN D DEFICIENCY 07/28/2009   Past Surgical History:  Procedure Laterality Date   ABDOMINAL HYSTERECTOMY     BREAST BIOPSY Left 02/26/2009   implant     neuro spine stimulator   LUMBAR FUSION  2008   s/p diskectomy  2004   L5   s/p right foot morton's neuroma  Jan. 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   Memory loss 03/25/2015   Chronic pain syndrome 03/25/2015   Memory dysfunction 02/16/2015   Nonallopathic lesion of cervical region 12/25/2014   Trigger point of  left shoulder region 12/05/2014   Whiplash injuries 12/04/2014   Acute hemorrhoid 10/04/2014   Fractured coccyx ([place]) 10/04/2014   Hormone replacement therapy 05/01/2014   Nonallopathic lesion of thoracic region 03/02/2014   Nonallopathic lesion of lumbosacral region 03/02/2014   Nonallopathic lesion of sacral region 03/02/2014   Trapezius muscle spasm 02/23/2014   Menopausal symptoms 10/28/2013   ANA positive 10/28/2013   Chronic lower back pain 10/28/2013   Varicose veins of lower extremities with other complications 65/78/4696   Varicose veins with pain 05/06/2013   Myalgia and myositis 04/30/2013   Abdominal pain, other specified site 09/16/2012   Polyarthralgia 09/16/2012   Palpitations 10/26/2011   Back pain 10/26/2011   Dyspepsia and other specified disorders of function of stomach 06/06/2011   Encounter for well adult exam with abnormal findings 02/27/2011   GERD (gastroesophageal reflux disease) 02/27/2011   ANXIETY 12/27/2010   INSOMNIA 06/02/2010   RASH-NONVESICULAR 06/02/2010   SHINGLES 11/09/2009   VITAMIN D DEFICIENCY 07/28/2009   Hyperlipidemia 10/31/2007   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   Depression 10/31/2007   Essential hypertension 10/31/2007   ARTHRITIS 10/31/2007   POLYARTHRALGIA 10/31/2007   LOW BACK PAIN 10/31/2007   Fibromyalgia 10/31/2007   Osteoporosis 10/31/2007    PCP: Leeroy Cha, MD   REFERRING PROVIDER: Murlean Iba, MD  REFERRING DIAG: [phone] (ICD-10-CM) - Fusion of spine, cervical region Lumbar spine  and left leg strengthening  THERAPY DIAG:  Cervicalgia  Other low back pain  Muscle weakness (generalized)  Rationale for Evaluation and Treatment: Rehabilitation  ONSET DATE: chronic neck and back pain, most recent surgery was neck fusion 08/09/22  SUBJECTIVE:                                                                                                                                                                                                          SUBJECTIVE STATEMENT: Pt stating she feels a lot better with her neck after last session and not as much catch in it when she turns it.   PERTINENT HISTORY:  ERX:VQMG 08/09/22, lumbar fusion, lumbar spinal stimulator, chronic pain, depression, fibromyalgia, osteoporosis  PAIN:  Are you having pain? Yes Pain scale: 4/10 Location: between shoulder blades  PRECAUTIONS: Cervical/Lumbar Fusion  WEIGHT BEARING RESTRICTIONS: No  FALLS:  Has patient fallen in last 6 months? Yes. Number of falls 1, tripped on curb and fell 11/26/22 on knee caps  and forearms so some left shoulder pain since thi  OCCUPATION: work from home  PLOF: Independent  PATIENT GOALS: reduce pain, feel better, turn her head more to the left  NEXT MD VISIT: 12/01/21  OBJECTIVE:   DIAGNOSTIC FINDINGS:  Will have XR when she sees MD again 12/01/21  PATIENT SURVEYS:  Eval: FOTO 52% functional, goal is 54%  COGNITION: Overall cognitive status: Within functional limits for tasks assessed  SENSATION: WFL  POSTURE: rounded shoulders  PALPATION: Eval: TTP in Cervical-thoracic-lumbar spine, in left shoulder and scapula, upper trap   CERVICAL ROM:   Active ROM AROM (deg) eval AROM 12/11/22  Flexion 30 42  Extension 20 35  Right lateral flexion 20 28  Left lateral flexion 10 30  Right rotation 60 68  Left rotation 30 48   (Blank rows = not tested)  Lumbar ROM at EVAL Flexion: 50% Extension: 75% Rotation: WNL   UPPER EXTREMITY ROM:  Active ROM Right eval Left eval  Shoulder flexion 120 110  Shoulder extension    Shoulder abduction 120 110  Shoulder adduction    Shoulder extension    Shoulder internal rotation WNL WNL  Shoulder external rotation WNL WNL  Elbow flexion    Elbow extension    Wrist flexion    Wrist extension    Wrist ulnar deviation    Wrist radial deviation    Wrist pronation    Wrist supination     (Blank rows = not  tested)  UPPER/LOWER EXTREMITY MMT:  MMT Right eval Left eval  Shoulder flexion 4 4  Shoulder extension    Shoulder abduction 4 3  Shoulder adduction    Shoulder extension    Shoulder internal rotation 5 5  Shoulder external rotation 5       Hip flexion in sitting 4+ 3  Hip abd in sitting 4+ 4+  Knee extension 5 4-  Knee flexion 5 4  Wrist extension    Wrist ulnar deviation    Wrist radial deviation    Wrist pronation    Wrist supination    Grip strength     (Blank rows = not tested)  SPECIAL TESTS:  Spurling's test: Negative SLUMP test + on left, inconclusive on Rt  FUNCTIONAL TESTS:  12/04/21:  5 time sit to stand: 26 seconds c UE support  TODAY'S TREATMENT:  12/21/22 TherEx:  Nu step L5  X9 min UE/LE seat #7 Rows: x 2 x 10 Level 3 band Shoulder extension: 2 x 10 Level 3 band Bilat shoulder ER with scap retraction L3 band 2X10 Standing P ball roll up wall into bilateral shoulder flexion and thoracic extension 5 sec hold X 10 Seated pball roll outs into lumbar flexion 5 sec X 10   Manual therapy: neck PROM rotation and sidebending, gentle cervical distractions, STM and trigger point release to bilat upper traps and cervical P.S.  Modalities: moist heat to neck X 7 min in supine, not included in treatment billing time    PATIENT EDUCATION: Education details: HEP, PT plan of care Person educated: Patient Education method: Explanation, Demonstration, Verbal cues, and Handouts Education comprehension: verbalized understanding and needs further education   HOME EXERCISE PROGRAM: Access Code: [email or web address] URL: [email or web address] Date: 11/28/2022 Prepared by: Ivery Quale  Exercises - Seated Assisted Cervical Rotation with Towel  - 2 x daily - 6 x weekly - 1 sets - 10 reps - 5 hold - Seated Cervical Sidebending Stretch (Mirrored)  - 2 x daily - 6 x weekly - 1 sets - 3 reps - 20 sec hold - Standing Scapular Retraction with External Rotation  - 2 x  daily - 6 x weekly - 1-2 sets - 10 reps - Slump Stretch  - 2 x daily - 6 x weekly - 1-2 sets - 10 reps - 3 hold - Seated Straight Leg Raise with Quad Contraction  - 2 x daily - 6 x weekly - 1-2 sets - 10 reps - Sit to Stand  - 2 x daily - 6 x weekly - 1-2 sets - 10 reps  ASSESSMENT:  CLINICAL IMPRESSION:  She still has catch in her neck with left rotation but this is not as bad as it has been. We continued with manual therapy from last time combined with her exercises to tolerance. PT recommending to continue current plan.   OBJECTIVE IMPAIRMENTS: decreased activity tolerance, difficulty walking,  decreased endurance, decreased mobility, decreased ROM, decreased strength, impaired flexibility, impaired UE/LE use, postural dysfunction, and pain.  ACTIVITY LIMITATIONS: bending, lifting, carry, locomotion, cleaning, community activity, driving, and or occupation  PERSONAL FACTORS: YPP:JKDT 08/09/22, lumbar fusion, lumbar spinal stimulator, chronic pain, depression, fibromyalgia, osteoporosis are also affecting patient's functional outcome.  REHAB POTENTIAL: Good  CLINICAL DECISION MAKING: Evolving/moderate complexity  EVALUATION COMPLEXITY: Moderate    GOALS: Short term PT Goals Target date: 12/26/2022   Pt will be I and compliant with HEP. Baseline:  Goal status: On-going 12/06/22 Pt will improve Left cervical rotation to 45 deg Baseline:30 Goal status: New  Long term PT goals Target date:01/09/2023  Pt will neck left cervical rotation ROM to 60 deg to improve scanning for driving Baseline: Goal status: New Pt will improve  Left hip/knee strength to at least 4/5 MMT to improve functional strength Baseline:3 Goal status: New Pt will improve FOTO to at least 54% functional to show improved function Baseline: Goal status: New Pt will reduce pain by overall 50% overall with usual activity Baseline: Goal status: New Pt will reduce pain to overall less than 2-3/10 with usual  activity, work, and sleeping Baseline: Goal status: New  PLAN: PT FREQUENCY: 1-2 times per week   PT DURATION: 6 weeks  PLANNED INTERVENTIONS (unless contraindicated): aquatic PT, Canalith repositioning, cryotherapy, Electrical stimulation, Iontophoresis with 4 mg/ml dexamethasome, Moist heat, traction, Ultrasound, gait training, Therapeutic exercise, balance training, neuromuscular re-education, patient/family education, prosthetic training, manual techniques, passive ROM, dry needling, taping, vasopnuematic device, vestibular, spinal manipulations, joint manipulations  PLAN FOR NEXT SESSION: neck and posture strengthening, manual therapy and or heat to neck if desired. History of fusions in neck/back   April Manson, PT, DPT 12/21/2022, 12:03 PM

## 2022-12-22 ENCOUNTER — Other Ambulatory Visit: Payer: Self-pay | Admitting: Internal Medicine

## 2022-12-22 DIAGNOSIS — R921 Mammographic calcification found on diagnostic imaging of breast: Secondary | ICD-10-CM

## 2022-12-25 ENCOUNTER — Ambulatory Visit: Payer: Medicare Other | Admitting: Physical Therapy

## 2022-12-25 ENCOUNTER — Encounter: Payer: Self-pay | Admitting: Physical Therapy

## 2022-12-25 DIAGNOSIS — M5459 Other low back pain: Secondary | ICD-10-CM | POA: Diagnosis not present

## 2022-12-25 DIAGNOSIS — M6281 Muscle weakness (generalized): Secondary | ICD-10-CM

## 2022-12-25 DIAGNOSIS — M542 Cervicalgia: Secondary | ICD-10-CM

## 2022-12-25 NOTE — Therapy (Signed)
OUTPATIENT PHYSICAL THERAPY TREATMENT   Patient Name: Catherine Gardner MRN: [id number] DOB:1961-11-15, 62 y.o., female Today's Date: 12/25/2022  END OF SESSION:  PT End of Session - 12/25/22 1158     Visit Number 7    Number of Visits 12    Date for PT Re-Evaluation 01/09/23    Progress Note Due on Visit 10    PT Start Time 6144    PT Stop Time 1230    PT Time Calculation (min) 42 min    Activity Tolerance Patient tolerated treatment well;Patient limited by pain    Behavior During Therapy [facility] for tasks assessed/performed                 Past Medical History:  Diagnosis Date   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   ANXIETY 12/27/2010   Arachnoiditis    ARTHRITIS 10/31/2007   Chronic lower back pain 10/28/2013   DEPRESSION 10/31/2007   FIBROMYALGIA 10/31/2007   Headache(784.0) 07/12/2009   HYPERLIPIDEMIA 10/31/2007   HYPERTENSION 10/31/2007   INSOMNIA 06/02/2010   LOW BACK PAIN 10/31/2007   Osteoporosis 10/31/2007   POLYARTHRALGIA 10/31/2007   RASH-NONVESICULAR 06/02/2010   SHINGLES 11/09/2009   SINUSITIS- ACUTE-NOS 12/20/2009   VITAMIN D DEFICIENCY 07/28/2009   Past Surgical History:  Procedure Laterality Date   ABDOMINAL HYSTERECTOMY     BREAST BIOPSY Left 02/26/2009   implant     neuro spine stimulator   LUMBAR FUSION  2008   s/p diskectomy  2004   L5   s/p right foot morton's neuroma  Jan. 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   Memory loss 03/25/2015   Chronic pain syndrome 03/25/2015   Memory dysfunction 02/16/2015   Nonallopathic lesion of cervical region 12/25/2014   Trigger point of  left shoulder region 12/05/2014   Whiplash injuries 12/04/2014   Acute hemorrhoid 10/04/2014   Fractured coccyx ([place]) 10/04/2014   Hormone replacement therapy 05/01/2014   Nonallopathic lesion of thoracic region 03/02/2014   Nonallopathic lesion of lumbosacral region 03/02/2014   Nonallopathic lesion of sacral region 03/02/2014   Trapezius muscle spasm 02/23/2014   Menopausal symptoms 10/28/2013   ANA positive 10/28/2013   Chronic lower back pain 10/28/2013   Varicose veins of lower extremities with other complications 31/54/0086   Varicose veins with pain 05/06/2013   Myalgia and myositis 04/30/2013   Abdominal pain, other specified site 09/16/2012   Polyarthralgia 09/16/2012   Palpitations 10/26/2011   Back pain 10/26/2011   Dyspepsia and other specified disorders of function of stomach 06/06/2011   Encounter for well adult exam with abnormal findings 02/27/2011   GERD (gastroesophageal reflux disease) 02/27/2011   ANXIETY 12/27/2010   INSOMNIA 06/02/2010   RASH-NONVESICULAR 06/02/2010   SHINGLES 11/09/2009   VITAMIN D DEFICIENCY 07/28/2009   Hyperlipidemia 10/31/2007   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   Depression 10/31/2007   Essential hypertension 10/31/2007   ARTHRITIS 10/31/2007   POLYARTHRALGIA 10/31/2007   LOW BACK PAIN 10/31/2007   Fibromyalgia 10/31/2007   Osteoporosis 10/31/2007    PCP: Leeroy Cha, MD   REFERRING PROVIDER: Murlean Iba, MD  REFERRING DIAG: [id number] (ICD-10-CM) - Fusion of spine, cervical region Lumbar spine  and left leg strengthening  THERAPY DIAG:  Cervicalgia  Other low back pain  Muscle weakness (generalized)  Rationale for Evaluation and Treatment: Rehabilitation  ONSET DATE: chronic neck and back pain, most recent surgery was neck fusion 08/09/22  SUBJECTIVE:                                                                                                                                                                                                          SUBJECTIVE STATEMENT: Pt stating feeling better and not having as much pain or catch in her neck. She did have some soreness with all the rain this weekend.  PERTINENT HISTORY:  KXF:[id number] 08/09/22, lumbar fusion, lumbar spinal stimulator, chronic pain, depression, fibromyalgia, osteoporosis  PAIN:  Are you having pain? Yes Pain scale: 3/10 Location: between shoulder blades  PRECAUTIONS: Cervical/Lumbar Fusion  WEIGHT BEARING RESTRICTIONS: No  FALLS:  Has patient fallen in last 6 months? Yes. Number of falls 1, tripped on curb and fell 11/26/22 on knee caps  and forearms so some left shoulder pain since thi  OCCUPATION: work from home  PLOF: Independent  PATIENT GOALS: reduce pain, feel better, turn her head more to the left  NEXT MD VISIT: 12/01/21  OBJECTIVE:   DIAGNOSTIC FINDINGS:  Will have XR when she sees MD again 12/01/21  PATIENT SURVEYS:  Eval: FOTO 52% functional, goal is 54%  COGNITION: Overall cognitive status: Within functional limits for tasks assessed  SENSATION: WFL  POSTURE: rounded shoulders  PALPATION: Eval: TTP in Cervical-thoracic-lumbar spine, in left shoulder and scapula, upper trap   CERVICAL ROM:   Active ROM AROM (deg) eval AROM 12/11/22  Flexion 30 42  Extension 20 35  Right lateral flexion 20 28  Left lateral flexion 10 30  Right rotation 60 68  Left rotation 30 48   (Blank rows = not tested)  Lumbar ROM at EVAL Flexion: 50% Extension: 75% Rotation: WNL   UPPER EXTREMITY ROM:  Active ROM Right eval Left eval  Shoulder flexion 120 110  Shoulder extension    Shoulder abduction 120 110  Shoulder adduction    Shoulder extension    Shoulder internal rotation WNL WNL  Shoulder external rotation WNL WNL  Elbow flexion    Elbow extension    Wrist flexion    Wrist extension    Wrist ulnar deviation    Wrist radial deviation    Wrist pronation    Wrist supination      (Blank rows = not tested)  UPPER/LOWER EXTREMITY MMT:  MMT Right eval Left  eval  Shoulder flexion 4 4  Shoulder extension    Shoulder abduction 4 3  Shoulder adduction    Shoulder extension    Shoulder internal rotation 5 5  Shoulder external rotation 5       Hip flexion in sitting 4+ 3  Hip abd in sitting 4+ 4+  Knee extension 5 4-  Knee flexion 5 4  Wrist extension    Wrist ulnar deviation    Wrist radial deviation    Wrist pronation    Wrist supination    Grip strength     (Blank rows = not tested)  SPECIAL TESTS:  Spurling's test: Negative SLUMP test + on left, inconclusive on Rt  FUNCTIONAL TESTS:  12/04/21:  5 time sit to stand: 26 seconds c UE support  TODAY'S TREATMENT:  12/25/22 TherEx:  Nu step L5  X10 min UE/LE seat #7 Rows: x 2 x 10 Level 3 band Shoulder extension: 2 x 10 Level 3 band Bilat shoulder ER with scap retraction L2 band 2X10 Standing P ball roll up wall into bilateral shoulder flexion and thoracic extension 5 sec hold  2X10   Manual therapy: neck PROM rotation and sidebending, gentle cervical distractions, STM and trigger point release to bilat upper traps and cervical P.S.  Modalities: moist heat to neck X 7 min in supine, not included in treatment billing time  12/21/22 TherEx:  Nu step L5  X9 min UE/LE seat #7 Rows: x 2 x 10 Level 3 band Shoulder extension: 2 x 10 Level 3 band Bilat shoulder ER with scap retraction L3 band 2X10 Standing P ball roll up wall into bilateral shoulder flexion and thoracic extension 5 sec hold X 10 Seated pball roll outs into lumbar flexion 5 sec X 10   Manual therapy: neck PROM rotation and sidebending, gentle cervical distractions, STM and trigger point release to bilat upper traps and cervical P.S.  Modalities: moist heat to neck X 7 min in supine, not included in treatment billing time    PATIENT EDUCATION: Education details: HEP, PT plan of care Person educated: Patient Education method:  Explanation, Demonstration, Verbal cues, and Handouts Education comprehension: verbalized understanding and needs further education   HOME EXERCISE PROGRAM: Access Code: [email or web address] URL: [email or web address] Date: 11/28/2022 Prepared by: Elsie Ra  Exercises - Seated Assisted Cervical Rotation with Towel  - 2 x daily - 6 x weekly - 1 sets - 10 reps - 5 hold - Seated Cervical Sidebending Stretch (Mirrored)  - 2 x daily - 6 x weekly - 1 sets - 3 reps - 20 sec hold - Standing Scapular Retraction with External Rotation  - 2 x daily - 6 x weekly - 1-2 sets - 10 reps - Slump Stretch  - 2 x daily - 6 x weekly - 1-2 sets - 10 reps - 3 hold - Seated Straight Leg Raise with Quad Contraction  - 2 x daily - 6 x weekly - 1-2 sets - 10 reps - Sit to Stand  - 2 x daily - 6 x weekly - 1-2 sets - 10 reps  ASSESSMENT:  CLINICAL IMPRESSION:  She relays she is improving with her neck/shoulder pain and not having as much catch in her neck. She is overall less tender to palpation and less tender with trigger point release manual therapy. I did provide her with resistance band to take home for strength progressions into her HEP. PT recommending to continue with current plan of care  OBJECTIVE IMPAIRMENTS: decreased activity tolerance,  difficulty walking,  decreased endurance, decreased mobility, decreased ROM, decreased strength, impaired flexibility, impaired UE/LE use, postural dysfunction, and pain.  ACTIVITY LIMITATIONS: bending, lifting, carry, locomotion, cleaning, community activity, driving, and or occupation  PERSONAL FACTORS: ZOX:WRUE 08/09/22, lumbar fusion, lumbar spinal stimulator, chronic pain, depression, fibromyalgia, osteoporosis are also affecting patient's functional outcome.  REHAB POTENTIAL: Good  CLINICAL DECISION MAKING: Evolving/moderate complexity  EVALUATION COMPLEXITY: Moderate    GOALS: Short term PT Goals Target date: 12/26/2022   Pt will be I and compliant  with HEP. Baseline:  Goal status: On-going 12/06/22 Pt will improve Left cervical rotation to 45 deg Baseline:30 Goal status: New  Long term PT goals Target date:01/09/2023   Pt will neck left cervical rotation ROM to 60 deg to improve scanning for driving Baseline: Goal status: New Pt will improve  Left hip/knee strength to at least 4/5 MMT to improve functional strength Baseline:3 Goal status: New Pt will improve FOTO to at least 54% functional to show improved function Baseline: Goal status: New Pt will reduce pain by overall 50% overall with usual activity Baseline: Goal status: New Pt will reduce pain to overall less than 2-3/10 with usual activity, work, and sleeping Baseline: Goal status: New  PLAN: PT FREQUENCY: 1-2 times per week   PT DURATION: 6 weeks  PLANNED INTERVENTIONS (unless contraindicated): aquatic PT, Canalith repositioning, cryotherapy, Electrical stimulation, Iontophoresis with 4 mg/ml dexamethasome, Moist heat, traction, Ultrasound, gait training, Therapeutic exercise, balance training, neuromuscular re-education, patient/family education, prosthetic training, manual techniques, passive ROM, dry needling, taping, vasopnuematic device, vestibular, spinal manipulations, joint manipulations  PLAN FOR NEXT SESSION: neck and posture strengthening, manual therapy and or heat to neck if desired. History of fusions in neck/back   April Manson, PT, DPT 12/25/2022, 11:59 AM

## 2022-12-28 ENCOUNTER — Encounter: Payer: Medicare Other | Admitting: Physical Therapy

## 2023-01-02 ENCOUNTER — Ambulatory Visit: Payer: Medicare Other | Admitting: Physical Therapy

## 2023-01-02 ENCOUNTER — Encounter: Payer: Self-pay | Admitting: Physical Therapy

## 2023-01-02 DIAGNOSIS — M6281 Muscle weakness (generalized): Secondary | ICD-10-CM | POA: Diagnosis not present

## 2023-01-02 DIAGNOSIS — M542 Cervicalgia: Secondary | ICD-10-CM | POA: Diagnosis not present

## 2023-01-02 DIAGNOSIS — M5459 Other low back pain: Secondary | ICD-10-CM

## 2023-01-02 NOTE — Therapy (Signed)
OUTPATIENT PHYSICAL THERAPY TREATMENT   Patient Name: Sarra Rachels MRN: [id number] DOB:Feb 03, 1961, 62 y.o., female Today's Date: 01/02/2023  END OF SESSION:  PT End of Session - 01/02/23 1220     Visit Number 8    Number of Visits 12    Date for PT Re-Evaluation 01/09/23    Progress Note Due on Visit 10    PT Start Time 1147    PT Stop Time 1230    PT Time Calculation (min) 43 min    Activity Tolerance Patient tolerated treatment well;Patient limited by pain    Behavior During Therapy [facility] for tasks assessed/performed                  Past Medical History:  Diagnosis Date   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   ANXIETY 12/27/2010   Arachnoiditis    ARTHRITIS 10/31/2007   Chronic lower back pain 10/28/2013   DEPRESSION 10/31/2007   FIBROMYALGIA 10/31/2007   Headache(784.0) 07/12/2009   HYPERLIPIDEMIA 10/31/2007   HYPERTENSION 10/31/2007   INSOMNIA 06/02/2010   LOW BACK PAIN 10/31/2007   Osteoporosis 10/31/2007   POLYARTHRALGIA 10/31/2007   RASH-NONVESICULAR 06/02/2010   SHINGLES 11/09/2009   SINUSITIS- ACUTE-NOS 12/20/2009   VITAMIN D DEFICIENCY 07/28/2009   Past Surgical History:  Procedure Laterality Date   ABDOMINAL HYSTERECTOMY     BREAST BIOPSY Left 02/26/2009   implant     neuro spine stimulator   LUMBAR FUSION  2008   s/p diskectomy  2004   L5   s/p right foot morton's neuroma  Jan. 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   Memory loss 03/25/2015   Chronic pain syndrome 03/25/2015   Memory dysfunction 02/16/2015   Nonallopathic lesion of cervical region 12/25/2014   Trigger point of  left shoulder region 12/05/2014   Whiplash injuries 12/04/2014   Acute hemorrhoid 10/04/2014   Fractured coccyx ([facility]) 10/04/2014   Hormone replacement therapy 05/01/2014   Nonallopathic lesion of thoracic region 03/02/2014   Nonallopathic lesion of lumbosacral region 03/02/2014   Nonallopathic lesion of sacral region 03/02/2014   Trapezius muscle spasm 02/23/2014   Menopausal symptoms 10/28/2013   ANA positive 10/28/2013   Chronic lower back pain 10/28/2013   Varicose veins of lower extremities with other complications 05/15/2013   Varicose veins with pain 05/06/2013   Myalgia and myositis 04/30/2013   Abdominal pain, other specified site 09/16/2012   Polyarthralgia 09/16/2012   Palpitations 10/26/2011   Back pain 10/26/2011   Dyspepsia and other specified disorders of function of stomach 06/06/2011   Encounter for well adult exam with abnormal findings 02/27/2011   GERD (gastroesophageal reflux disease) 02/27/2011   ANXIETY 12/27/2010   INSOMNIA 06/02/2010   RASH-NONVESICULAR 06/02/2010   SHINGLES 11/09/2009   VITAMIN D DEFICIENCY 07/28/2009   Hyperlipidemia 10/31/2007   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   Depression 10/31/2007   Essential hypertension 10/31/2007   ARTHRITIS 10/31/2007   POLYARTHRALGIA 10/31/2007   LOW BACK PAIN 10/31/2007   Fibromyalgia 10/31/2007   Osteoporosis 10/31/2007    PCP: Lorenda Ishihara, MD   REFERRING PROVIDER: Adelfa Koh, MD  REFERRING DIAG: [phone] (ICD-10-CM) - Fusion of spine, cervical region Lumbar  spine and left leg strengthening  THERAPY DIAG:  Cervicalgia  Other low back pain  Muscle weakness (generalized)  Rationale for Evaluation and Treatment: Rehabilitation  ONSET DATE: chronic neck and back pain, most recent surgery was neck fusion 08/09/22  SUBJECTIVE:                                                                                                                                                                                                          SUBJECTIVE STATEMENT: Pt stating her neck is doing better, not really having the catch in it when she turns her head like she was having. She still has soreness in it and has some pain when turning her head fast. She says her Left hip is bothering her today  PERTINENT HISTORY:  NWG:NFAO 08/09/22, lumbar fusion, lumbar spinal stimulator, chronic pain, depression, fibromyalgia, osteoporosis  PAIN:  Are you having pain? Yes Pain scale: did not rate/10 Location: between shoulder blades  PRECAUTIONS: Cervical/Lumbar Fusion  WEIGHT BEARING RESTRICTIONS: No  FALLS:  Has patient fallen in last 6 months? Yes. Number of falls 1, tripped on curb and fell 11/26/22 on knee caps  and forearms so some left shoulder pain since thi  OCCUPATION: work from home  PLOF: Independent  PATIENT GOALS: reduce pain, feel better, turn her head more to the left  NEXT MD VISIT: 12/01/21  OBJECTIVE:   DIAGNOSTIC FINDINGS:  Will have XR when she sees MD again 12/01/21  PATIENT SURVEYS:  Eval: FOTO 52% functional, goal is 54%  COGNITION: Overall cognitive status: Within functional limits for tasks assessed  SENSATION: WFL  POSTURE: rounded shoulders  PALPATION: Eval: TTP in Cervical-thoracic-lumbar spine, in left shoulder and scapula, upper trap   CERVICAL ROM:   Active ROM AROM (deg) eval AROM 12/11/22  Flexion 30 42  Extension 20 35  Right lateral flexion 20 28  Left lateral flexion 10 30  Right rotation 60 68  Left rotation 30 48   (Blank rows = not tested)  Lumbar ROM at EVAL Flexion: 50% Extension: 75% Rotation: WNL   UPPER EXTREMITY ROM:  Active ROM Right eval Left eval  Shoulder flexion 120 110  Shoulder extension    Shoulder abduction 120 110  Shoulder adduction    Shoulder extension    Shoulder internal rotation WNL WNL  Shoulder external rotation WNL WNL  Elbow flexion    Elbow extension    Wrist flexion    Wrist  extension    Wrist ulnar deviation    Wrist radial deviation    Wrist  pronation    Wrist supination     (Blank rows = not tested)  UPPER/LOWER EXTREMITY MMT:  MMT Right eval Left eval  Shoulder flexion 4 4  Shoulder extension    Shoulder abduction 4 3  Shoulder adduction    Shoulder extension    Shoulder internal rotation 5 5  Shoulder external rotation 5       Hip flexion in sitting 4+ 3  Hip abd in sitting 4+ 4+  Knee extension 5 4-  Knee flexion 5 4  Wrist extension    Wrist ulnar deviation    Wrist radial deviation    Wrist pronation    Wrist supination    Grip strength     (Blank rows = not tested)  SPECIAL TESTS:  Spurling's test: Negative SLUMP test + on left, inconclusive on Rt  FUNCTIONAL TESTS:  12/04/21:  5 time sit to stand: 26 seconds c UE support  TODAY'S TREATMENT:  01/02/23 TherEx:  Recumbent bike L2 X 5 min Rows: x 2 x 10 Level 3 band Shoulder extension: 2 x 10 Level 3 band Bilat shoulder ER with scap retraction L2 band 2X10 Standing P ball roll up wall into bilateral shoulder flexion and thoracic extension 5 sec hold  2X10 Sidestepping with red band around knees 3 round trips in bars Backwards walking with red band around knees 2 round trips in bars   Manual therapy: neck PROM rotation and sidebending, gentle cervical distractions, STM and trigger point release to bilat upper traps and cervical P.S.  Modalities: moist heat to neck X 7 min in supine, not included in treatment billing time  12/25/22 TherEx:  Nu step L5  X10 min UE/LE seat #7 Rows: x 2 x 10 Level 3 band Shoulder extension: 2 x 10 Level 3 band Bilat shoulder ER with scap retraction L2 band 2X10 Standing P ball roll up wall into bilateral shoulder flexion and thoracic extension 5 sec hold  2X10   Manual therapy: neck PROM rotation and sidebending, gentle cervical distractions, STM and trigger point release to bilat upper traps and cervical P.S.  Modalities: moist heat to neck  X 7 min in supine, not included in treatment billing time    PATIENT EDUCATION: Education details: HEP, PT plan of care Person educated: Patient Education method: Explanation, Demonstration, Verbal cues, and Handouts Education comprehension: verbalized understanding and needs further education   HOME EXERCISE PROGRAM: Access Code: [email or web address] URL: [email or web address] Date: 11/28/2022 Prepared by: Elsie Ra  Exercises - Seated Assisted Cervical Rotation with Towel  - 2 x daily - 6 x weekly - 1 sets - 10 reps - 5 hold - Seated Cervical Sidebending Stretch (Mirrored)  - 2 x daily - 6 x weekly - 1 sets - 3 reps - 20 sec hold - Standing Scapular Retraction with External Rotation  - 2 x daily - 6 x weekly - 1-2 sets - 10 reps - Slump Stretch  - 2 x daily - 6 x weekly - 1-2 sets - 10 reps - 3 hold - Seated Straight Leg Raise with Quad Contraction  - 2 x daily - 6 x weekly - 1-2 sets - 10 reps - Sit to Stand  - 2 x daily - 6 x weekly - 1-2 sets - 10 reps  ASSESSMENT:  CLINICAL IMPRESSION:  Her neck appears to be improving with PT in terms of not having as much pain or soreness without the catch she was reporting. She had more overall hip pain today so  we gave her some exercises for this. PT recommending to continue with current PT plan.  OBJECTIVE IMPAIRMENTS: decreased activity tolerance, difficulty walking,  decreased endurance, decreased mobility, decreased ROM, decreased strength, impaired flexibility, impaired UE/LE use, postural dysfunction, and pain.  ACTIVITY LIMITATIONS: bending, lifting, carry, locomotion, cleaning, community activity, driving, and or occupation  PERSONAL FACTORS: XNA:TFTD 08/09/22, lumbar fusion, lumbar spinal stimulator, chronic pain, depression, fibromyalgia, osteoporosis are also affecting patient's functional outcome.  REHAB POTENTIAL: Good  CLINICAL DECISION MAKING: Evolving/moderate complexity  EVALUATION COMPLEXITY:  Moderate    GOALS: Short term PT Goals Target date: 12/26/2022   Pt will be I and compliant with HEP. Baseline:  Goal status: On-going 12/06/22 Pt will improve Left cervical rotation to 45 deg Baseline:30 Goal status: New  Long term PT goals Target date:01/09/2023   Pt will neck left cervical rotation ROM to 60 deg to improve scanning for driving Baseline: Goal status: New Pt will improve  Left hip/knee strength to at least 4/5 MMT to improve functional strength Baseline:3 Goal status: New Pt will improve FOTO to at least 54% functional to show improved function Baseline: Goal status: New Pt will reduce pain by overall 50% overall with usual activity Baseline: Goal status: New Pt will reduce pain to overall less than 2-3/10 with usual activity, work, and sleeping Baseline: Goal status: New  PLAN: PT FREQUENCY: 1-2 times per week   PT DURATION: 6 weeks  PLANNED INTERVENTIONS (unless contraindicated): aquatic PT, Canalith repositioning, cryotherapy, Electrical stimulation, Iontophoresis with 4 mg/ml dexamethasome, Moist heat, traction, Ultrasound, gait training, Therapeutic exercise, balance training, neuromuscular re-education, patient/family education, prosthetic training, manual techniques, passive ROM, dry needling, taping, vasopnuematic device, vestibular, spinal manipulations, joint manipulations  PLAN FOR NEXT SESSION: neck and posture strengthening, manual therapy and or heat to neck if desired. History of fusions in neck/back   Debbe Odea, PT, DPT 01/02/2023, 12:21 PM

## 2023-01-03 ENCOUNTER — Ambulatory Visit
Admission: RE | Admit: 2023-01-03 | Discharge: 2023-01-03 | Disposition: A | Discharge status: Home / Self Care | Payer: Medicare Other | Source: Ambulatory Visit | Attending: Internal Medicine | Admitting: Internal Medicine

## 2023-01-03 DIAGNOSIS — R921 Mammographic calcification found on diagnostic imaging of breast: Secondary | ICD-10-CM | POA: Diagnosis not present

## 2023-01-03 DIAGNOSIS — N6489 Other specified disorders of breast: Secondary | ICD-10-CM | POA: Diagnosis not present

## 2023-01-03 HISTORY — PX: BREAST BIOPSY: SHX20

## 2023-01-04 ENCOUNTER — Ambulatory Visit: Payer: Medicare Other | Admitting: Physical Therapy

## 2023-01-04 ENCOUNTER — Encounter: Payer: Self-pay | Admitting: Physical Therapy

## 2023-01-04 DIAGNOSIS — M6281 Muscle weakness (generalized): Secondary | ICD-10-CM | POA: Diagnosis not present

## 2023-01-04 DIAGNOSIS — M542 Cervicalgia: Secondary | ICD-10-CM | POA: Diagnosis not present

## 2023-01-04 DIAGNOSIS — M5459 Other low back pain: Secondary | ICD-10-CM | POA: Diagnosis not present

## 2023-01-04 NOTE — Therapy (Signed)
OUTPATIENT PHYSICAL THERAPY TREATMENT   Patient Name: Catherine Gardner MRN: [id number] DOB:03/21/1961, 62 y.o., female Today's Date: 01/04/2023  END OF SESSION:  PT End of Session - 01/04/23 1157     Visit Number 9    Number of Visits 12    Date for PT Re-Evaluation 01/09/23    Progress Note Due on Visit 10    PT Start Time 9485    PT Stop Time 1233    PT Time Calculation (min) 45 min    Activity Tolerance Patient tolerated treatment well;Patient limited by pain    Behavior During Therapy [facility] for tasks assessed/performed                  Past Medical History:  Diagnosis Date   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   ANXIETY 12/27/2010   Arachnoiditis    ARTHRITIS 10/31/2007   Chronic lower back pain 10/28/2013   DEPRESSION 10/31/2007   FIBROMYALGIA 10/31/2007   Headache(784.0) 07/12/2009   HYPERLIPIDEMIA 10/31/2007   HYPERTENSION 10/31/2007   INSOMNIA 06/02/2010   LOW BACK PAIN 10/31/2007   Osteoporosis 10/31/2007   POLYARTHRALGIA 10/31/2007   RASH-NONVESICULAR 06/02/2010   SHINGLES 11/09/2009   SINUSITIS- ACUTE-NOS 12/20/2009   VITAMIN D DEFICIENCY 07/28/2009   Past Surgical History:  Procedure Laterality Date   ABDOMINAL HYSTERECTOMY     BREAST BIOPSY Left 02/26/2009   BREAST BIOPSY Left 01/03/2023   MM LT BREAST BX W LOC DEV 1ST LESION IMAGE BX SPEC STEREO GUIDE 01/03/2023 [facility]   implant     neuro spine stimulator   LUMBAR FUSION  2008   s/p diskectomy  2004   L5   s/p right foot morton's neuroma  Jan. 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   Memory loss 03/25/2015   Chronic pain  syndrome 03/25/2015   Memory dysfunction 02/16/2015   Nonallopathic lesion of cervical region 12/25/2014   Trigger point of left shoulder region 12/05/2014   Whiplash injuries 12/04/2014   Acute hemorrhoid 10/04/2014   Fractured coccyx ([place]) 10/04/2014   Hormone replacement therapy 05/01/2014   Nonallopathic lesion of thoracic region 03/02/2014   Nonallopathic lesion of lumbosacral region 03/02/2014   Nonallopathic lesion of sacral region 03/02/2014   Trapezius muscle spasm 02/23/2014   Menopausal symptoms 10/28/2013   ANA positive 10/28/2013   Chronic lower back pain 10/28/2013   Varicose veins of lower extremities with other complications 46/27/0350   Varicose veins with pain 05/06/2013   Myalgia and myositis 04/30/2013   Abdominal pain, other specified site 09/16/2012   Polyarthralgia 09/16/2012   Palpitations 10/26/2011   Back pain 10/26/2011   Dyspepsia and other specified disorders of function of stomach 06/06/2011   Encounter for well adult exam with abnormal findings 02/27/2011   GERD (gastroesophageal reflux disease) 02/27/2011   ANXIETY 12/27/2010   INSOMNIA 06/02/2010   RASH-NONVESICULAR 06/02/2010   SHINGLES 11/09/2009   VITAMIN D DEFICIENCY 07/28/2009   Hyperlipidemia 10/31/2007   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   Depression 10/31/2007   Essential hypertension 10/31/2007   ARTHRITIS 10/31/2007   POLYARTHRALGIA 10/31/2007   LOW BACK PAIN 10/31/2007   Fibromyalgia 10/31/2007   Osteoporosis 10/31/2007  PCP: Leeroy Cha, MD   REFERRING PROVIDER: Murlean Iba, MD  REFERRING DIAG: [phone] (ICD-10-CM) - Fusion of spine, cervical region Lumbar spine and left leg strengthening  THERAPY DIAG:  Cervicalgia  Other low back pain  Muscle weakness (generalized)  Rationale for Evaluation and Treatment: Rehabilitation  ONSET DATE: chronic neck and back pain, most recent surgery was neck fusion 08/09/22  SUBJECTIVE:                                                                                                                                                                                                          SUBJECTIVE STATEMENT: Pt stating the pain is okay today, she had a breast biopsy recently so has some difficulty reaching up with left arm and is not supposed to lift more than 5 lbs  PERTINENT HISTORY:  [id number] 08/09/22, lumbar fusion, lumbar spinal stimulator, chronic pain, depression, fibromyalgia, osteoporosis  PAIN:  Are you having pain? Yes Pain scale: did not rate/10 Location: between shoulder blades  PRECAUTIONS: Cervical/Lumbar Fusion  WEIGHT BEARING RESTRICTIONS: No  FALLS:  Has patient fallen in last 6 months? Yes. Number of falls 1, tripped on curb and fell 11/26/22 on knee caps  and forearms so some left shoulder pain since thi  OCCUPATION: work from home  PLOF: Independent  PATIENT GOALS: reduce pain, feel better, turn her head more to the left  NEXT MD VISIT: 12/01/21  OBJECTIVE:   DIAGNOSTIC FINDINGS:  Will have XR when she sees MD again 12/01/21  PATIENT SURVEYS:  Eval: FOTO 52% functional, goal is 54%  COGNITION: Overall cognitive status: Within functional limits for tasks assessed  SENSATION: WFL  POSTURE: rounded shoulders  PALPATION: Eval: TTP in Cervical-thoracic-lumbar spine, in left shoulder and scapula, upper trap   CERVICAL ROM:   Active ROM AROM (deg) eval AROM 12/11/22  Flexion 30 42  Extension 20 35  Right lateral flexion 20 28  Left lateral flexion 10 30  Right rotation 60 68  Left rotation 30 48   (Blank rows = not tested)  Lumbar ROM at EVAL Flexion: 50% Extension: 75% Rotation: WNL   UPPER EXTREMITY ROM:  Active ROM Right eval Left eval  Shoulder flexion 120 110  Shoulder extension    Shoulder abduction 120 110  Shoulder adduction    Shoulder extension    Shoulder internal rotation WNL WNL  Shoulder external rotation WNL WNL  Elbow  flexion    Elbow extension    Wrist flexion    Wrist extension    Wrist ulnar deviation  Wrist radial deviation    Wrist pronation    Wrist supination     (Blank rows = not tested)  UPPER/LOWER EXTREMITY MMT:  MMT Right eval Left eval  Shoulder flexion 4 4  Shoulder extension    Shoulder abduction 4 3  Shoulder adduction    Shoulder extension    Shoulder internal rotation 5 5  Shoulder external rotation 5       Hip flexion in sitting 4+ 3  Hip abd in sitting 4+ 4+  Knee extension 5 4-  Knee flexion 5 4  Wrist extension    Wrist ulnar deviation    Wrist radial deviation    Wrist pronation    Wrist supination    Grip strength     (Blank rows = not tested)  SPECIAL TESTS:  Spurling's test: Negative SLUMP test + on left, inconclusive on Rt  FUNCTIONAL TESTS:  12/04/21:  5 time sit to stand: 26 seconds c UE support  TODAY'S TREATMENT:  01/02/23 TherEx:  Nu step L5 X 8 min UE/LE Rows: x 2 x 10 Level 2 band Shoulder extension: 2 x 10 Level 2 band Bilat shoulder ER with scap retraction L2 band 2X10 Seated shoulder flexion 2# 2X10 Seated shoulder abduction 2# 2X10 Sidestepping with red band around knees 3 round trips in bars Backwards walking with red band around knees 3 round trips in bars  Modalities: moist heat to neck X 7 min in seated not included in treatment billing time    PATIENT EDUCATION: Education details: HEP, PT plan of care Person educated: Patient Education method: Explanation, Demonstration, Verbal cues, and Handouts Education comprehension: verbalized understanding and needs further education   HOME EXERCISE PROGRAM: Access Code: [email or web address] URL: [email or web address] Date: 11/28/2022 Prepared by: Ivery Quale  Exercises - Seated Assisted Cervical Rotation with Towel  - 2 x daily - 6 x weekly - 1 sets - 10 reps - 5 hold - Seated Cervical Sidebending Stretch (Mirrored)  - 2 x daily - 6 x weekly - 1 sets - 3 reps - 20 sec  hold - Standing Scapular Retraction with External Rotation  - 2 x daily - 6 x weekly - 1-2 sets - 10 reps - Slump Stretch  - 2 x daily - 6 x weekly - 1-2 sets - 10 reps - 3 hold - Seated Straight Leg Raise with Quad Contraction  - 2 x daily - 6 x weekly - 1-2 sets - 10 reps - Sit to Stand  - 2 x daily - 6 x weekly - 1-2 sets - 10 reps  ASSESSMENT:  CLINICAL IMPRESSION:  She had a recent breast biopsy so was limited with overhead reaching today so we avoided this with her exercises. She is overall doing better with her pain lately with PT and had good tolerance to her exercises. PT recommending to continue current plan.  OBJECTIVE IMPAIRMENTS: decreased activity tolerance, difficulty walking,  decreased endurance, decreased mobility, decreased ROM, decreased strength, impaired flexibility, impaired UE/LE use, postural dysfunction, and pain.  ACTIVITY LIMITATIONS: bending, lifting, carry, locomotion, cleaning, community activity, driving, and or occupation  PERSONAL FACTORS: [id number] 08/09/22, lumbar fusion, lumbar spinal stimulator, chronic pain, depression, fibromyalgia, osteoporosis are also affecting patient's functional outcome.  REHAB POTENTIAL: Good  CLINICAL DECISION MAKING: Evolving/moderate complexity  EVALUATION COMPLEXITY: Moderate    GOALS: Short term PT Goals Target date: 12/26/2022   Pt will be I and compliant with HEP. Baseline:  Goal status: On-going 12/06/22 Pt will improve Left cervical rotation to  45 deg Baseline:30 Goal status: New  Long term PT goals Target date:01/09/2023   Pt will neck left cervical rotation ROM to 60 deg to improve scanning for driving Baseline: Goal status: New Pt will improve  Left hip/knee strength to at least 4/5 MMT to improve functional strength Baseline:3 Goal status: New Pt will improve FOTO to at least 54% functional to show improved function Baseline: Goal status: New Pt will reduce pain by overall 50% overall with usual  activity Baseline: Goal status: New Pt will reduce pain to overall less than 2-3/10 with usual activity, work, and sleeping Baseline: Goal status: New  PLAN: PT FREQUENCY: 1-2 times per week   PT DURATION: 6 weeks  PLANNED INTERVENTIONS (unless contraindicated): aquatic PT, Canalith repositioning, cryotherapy, Electrical stimulation, Iontophoresis with 4 mg/ml dexamethasome, Moist heat, traction, Ultrasound, gait training, Therapeutic exercise, balance training, neuromuscular re-education, patient/family education, prosthetic training, manual techniques, passive ROM, dry needling, taping, vasopnuematic device, vestibular, spinal manipulations, joint manipulations  PLAN FOR NEXT SESSION: neck and posture strengthening, manual therapy and or heat to neck if desired. History of fusions in neck/back   Debbe Odea, PT, DPT 01/04/2023, 12:32 PM

## 2023-01-09 ENCOUNTER — Ambulatory Visit: Payer: Medicare Other | Admitting: Physical Therapy

## 2023-01-09 ENCOUNTER — Encounter: Payer: Self-pay | Admitting: Physical Therapy

## 2023-01-09 DIAGNOSIS — M542 Cervicalgia: Secondary | ICD-10-CM | POA: Diagnosis not present

## 2023-01-09 DIAGNOSIS — M6281 Muscle weakness (generalized): Secondary | ICD-10-CM

## 2023-01-09 DIAGNOSIS — M5459 Other low back pain: Secondary | ICD-10-CM | POA: Diagnosis not present

## 2023-01-09 NOTE — Therapy (Signed)
OUTPATIENT PHYSICAL THERAPY TREATMENT, Recert, progress note Progress Note reporting period 11/28/22 to 01/09/23  See below for objective and subjective measurements relating to patients progress with PT.    Patient Name: Catherine Gardner MRN: [id number] DOB:[birth year], 62 y.o., female Today's Date: 01/09/2023  END OF SESSION:  PT End of Session - 01/09/23 1106     Visit Number 10    Number of Visits 18    Date for PT Re-Evaluation 02/06/23    Authorization Type MCR    Progress Note Due on Visit 20    PT Start Time 1104    PT Stop Time 1150    PT Time Calculation (min) 46 min    Activity Tolerance Patient tolerated treatment well    Behavior During Therapy [facility] for tasks assessed/performed                  Past Medical History:  Diagnosis Date   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   ANXIETY 12/27/2010   Arachnoiditis    ARTHRITIS 10/31/2007   Chronic lower back pain 10/28/2013   DEPRESSION 10/31/2007   FIBROMYALGIA 10/31/2007   Headache(784.0) 07/12/2009   HYPERLIPIDEMIA 10/31/2007   HYPERTENSION 10/31/2007   INSOMNIA 06/02/2010   LOW BACK PAIN 10/31/2007   Osteoporosis 10/31/2007   POLYARTHRALGIA 10/31/2007   RASH-NONVESICULAR 06/02/2010   SHINGLES 11/09/2009   SINUSITIS- ACUTE-NOS 12/20/2009   VITAMIN D DEFICIENCY 07/28/2009   Past Surgical History:  Procedure Laterality Date   ABDOMINAL HYSTERECTOMY     BREAST BIOPSY Left 02/26/2009   BREAST BIOPSY Left 01/03/2023   MM LT BREAST BX W LOC DEV 1ST LESION IMAGE BX SPEC STEREO GUIDE 01/03/2023 [facility]   implant     neuro spine stimulator   LUMBAR FUSION  2008   s/p diskectomy  2004   L5   s/p right foot morton's neuroma  Jan. 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   Memory loss 03/25/2015   Chronic pain syndrome 03/25/2015   Memory dysfunction 02/16/2015   Nonallopathic lesion of cervical region 12/25/2014   Trigger point of left shoulder region 12/05/2014   Whiplash injuries 12/04/2014   Acute hemorrhoid 10/04/2014   Fractured coccyx ([place]) 10/04/2014   Hormone replacement therapy 05/01/2014   Nonallopathic lesion of thoracic region 03/02/2014   Nonallopathic lesion of lumbosacral region 03/02/2014   Nonallopathic lesion of sacral region 03/02/2014   Trapezius muscle spasm 02/23/2014   Menopausal symptoms 10/28/2013   ANA positive 10/28/2013   Chronic lower back pain 10/28/2013   Varicose veins of lower extremities with other complications [id number]   Varicose veins with pain 05/06/2013   Myalgia and myositis 04/30/2013   Abdominal pain, other specified site 09/16/2012   Polyarthralgia 09/16/2012   Palpitations 10/26/2011   Back pain 10/26/2011   Dyspepsia and other specified disorders of function of stomach 06/06/2011   Encounter for well adult exam with abnormal findings 02/27/2011   GERD (gastroesophageal reflux disease) 02/27/2011   ANXIETY 12/27/2010   INSOMNIA 06/02/2010   RASH-NONVESICULAR 06/02/2010   SHINGLES 11/09/2009   VITAMIN D DEFICIENCY 07/28/2009   Hyperlipidemia 10/31/2007   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   Depression 10/31/2007  Essential hypertension 10/31/2007   ARTHRITIS 10/31/2007   POLYARTHRALGIA 10/31/2007   LOW BACK PAIN 10/31/2007   Fibromyalgia 10/31/2007   Osteoporosis 10/31/2007    PCP: Leeroy Cha, MD   REFERRING PROVIDER: Murlean Iba, MD  REFERRING DIAG: [phone] (ICD-10-CM) - Fusion of spine, cervical region Lumbar spine and left leg strengthening  THERAPY DIAG:  Cervicalgia  Other low back pain  Muscle weakness  (generalized)  Rationale for Evaluation and Treatment: Rehabilitation  ONSET DATE: chronic neck and back pain, most recent surgery was neck fusion 08/09/22  SUBJECTIVE:                                                                                                                                                                                                         SUBJECTIVE STATEMENT: Pt stating the pain is much better overall, she was able to make up the bed today without complaints. She feels overall 90% progress with PT  PERTINENT HISTORY:  [id number] 08/09/22, lumbar fusion, lumbar spinal stimulator, chronic pain, depression, fibromyalgia, osteoporosis  PAIN:  Are you having pain? Yes Pain scale: did not rate/10 Location: between shoulder blades  PRECAUTIONS: Cervical/Lumbar Fusion  WEIGHT BEARING RESTRICTIONS: No  FALLS:  Has patient fallen in last 6 months? Yes. Number of falls 1, tripped on curb and fell 11/26/22 on knee caps  and forearms so some left shoulder pain since thi  OCCUPATION: work from home  PLOF: Independent  PATIENT GOALS: reduce pain, feel better, turn her head more to the left  NEXT MD VISIT: 12/01/21  OBJECTIVE:   DIAGNOSTIC FINDINGS:  Will have XR when she sees MD again 12/01/21  PATIENT SURVEYS:  Eval: FOTO 52% functional, goal is 54% 01/09/23: FOTO improved to 57% and met goal for this  COGNITION: Overall cognitive status: Within functional limits for tasks assessed  SENSATION: WFL  POSTURE: rounded shoulders  PALPATION: Eval: TTP in Cervical-thoracic-lumbar spine, in left shoulder and scapula, upper trap   CERVICAL ROM:   Active ROM AROM (deg) eval AROM 12/11/22 AROM 01/09/23  Flexion 30 42 42  Extension 20 35 40  Right lateral flexion 20 28 30  $ Left lateral flexion 10 30 25  $ Right rotation 60 68 80  Left rotation 30 48 68   (Blank rows = not tested)  Lumbar ROM at EVAL Flexion: 50% Extension: 75% Rotation: WNL   UPPER  EXTREMITY ROM:  Active ROM Right eval Left eval Right/Left 01/09/23   Shoulder flexion 120 110 140/140  Shoulder extension     Shoulder abduction 120 110 140/140  Shoulder adduction     Shoulder extension     Shoulder internal rotation WNL WNL   Shoulder external rotation WNL WNL   Elbow flexion     Elbow extension     Wrist flexion     Wrist extension     Wrist ulnar deviation     Wrist radial deviation     Wrist pronation     Wrist supination      (Blank rows = not tested)  UPPER/LOWER EXTREMITY MMT:  MMT Right eval Left eval Right/Left 01/09/23  Shoulder flexion 4 4 4+/4+  Shoulder extension     Shoulder abduction 4 3 4+/4  Shoulder adduction     Shoulder extension     Shoulder internal rotation 5 5 5/5  Shoulder external rotation 5  5/4+       Hip flexion in sitting 4+ 3   Hip abd in sitting 4+ 4+   Knee extension 5 4-   Knee flexion 5 4   Wrist extension     Wrist ulnar deviation     Wrist radial deviation     Wrist pronation     Wrist supination     Grip strength   4+/4+   (Blank rows = not tested)  SPECIAL TESTS:  Spurling's test: Negative SLUMP test + on left, inconclusive on Rt  FUNCTIONAL TESTS:  12/04/21:  5 time sit to stand: 26 seconds c UE support  TODAY'S TREATMENT:  01/09/23 TherEx:  Nu step L6 X 8 min UE/LE Rows: x 20 green  band Shoulder extension X 20 green band Bilat shoulder ER with scap retraction red band 2X10 Bilat shoulder horizontal abd with red band 2X10 Seated shoulder flexion 2# X10 Seated shoulder abduction 2# 2X10 Standing hip abd with red X 15 bilat Standing hip extension with red X 15 bilat Updated measurements, FOTO, goals for progress ntoe  Modalities: moist heat to neck X 7 min in seated not included in treatment billing time    PATIENT EDUCATION: Education details: HEP, PT plan of care Person educated: Patient Education method: Explanation, Demonstration, Verbal cues, and Handouts Education comprehension:  verbalized understanding and needs further education   HOME EXERCISE PROGRAM: Access Code: [email or web address] URL: [email or web address] Date: 11/28/2022 Prepared by: Elsie Ra  Exercises - Seated Assisted Cervical Rotation with Towel  - 2 x daily - 6 x weekly - 1 sets - 10 reps - 5 hold - Seated Cervical Sidebending Stretch (Mirrored)  - 2 x daily - 6 x weekly - 1 sets - 3 reps - 20 sec hold - Standing Scapular Retraction with External Rotation  - 2 x daily - 6 x weekly - 1-2 sets - 10 reps - Slump Stretch  - 2 x daily - 6 x weekly - 1-2 sets - 10 reps - 3 hold - Seated Straight Leg Raise with Quad Contraction  - 2 x daily - 6 x weekly - 1-2 sets - 10 reps - Sit to Stand  - 2 x daily - 6 x weekly - 1-2 sets - 10 reps  ASSESSMENT:  CLINICAL IMPRESSION:  10th visit progress note reflects she is making good overall progress with PT. She has improved her ROM and strength and has met her short term goals. She has met some of her long term goals but still missing some overall strength and has difficulty with some functional tasks involving lifting or turning her head to the left so PT recommending up to another 4 weeks of PT and  then we will work to transition to independent program.   OBJECTIVE IMPAIRMENTS: decreased activity tolerance, difficulty walking,  decreased endurance, decreased mobility, decreased ROM, decreased strength, impaired flexibility, impaired UE/LE use, postural dysfunction, and pain.  ACTIVITY LIMITATIONS: bending, lifting, carry, locomotion, cleaning, community activity, driving, and or occupation  PERSONAL FACTORS: [id number] 08/09/22, lumbar fusion, lumbar spinal stimulator, chronic pain, depression, fibromyalgia, osteoporosis are also affecting patient's functional outcome.  REHAB POTENTIAL: Good  CLINICAL DECISION MAKING: Evolving/moderate complexity  EVALUATION COMPLEXITY: Moderate    GOALS: Short term PT Goals Target date: 12/26/2022   Pt will be I  and compliant with HEP. Baseline:  Goal status: MET 01/09/23 Pt will improve Left cervical rotation to 45 deg Baseline:30 Goal status: MET 01/09/23  Long term PT goals Target date:01/09/2023   Pt will neck left cervical rotation ROM to 60 deg to improve scanning for driving Baseline: Goal status: MET 01/09/23 Pt will improve  Left hip/knee strength to at least 4/5 MMT to improve functional strength Baseline:3 Goal status: ongoing Pt will improve FOTO to at least 54% functional to show improved function Baseline: Goal status: MET 01/09/23, improved to 57% Pt will reduce pain by overall 50% overall with usual activity Baseline: Goal status: ongoing Pt will reduce pain to overall less than 2-3/10 with usual activity, work, and sleeping Baseline: Goal status: ongoing  PLAN: PT FREQUENCY: 1-2 times per week   PT DURATION: 6 weeks  PLANNED INTERVENTIONS (unless contraindicated): aquatic PT, Canalith repositioning, cryotherapy, Electrical stimulation, Iontophoresis with 4 mg/ml dexamethasome, Moist heat, traction, Ultrasound, gait training, Therapeutic exercise, balance training, neuromuscular re-education, patient/family education, prosthetic training, manual techniques, passive ROM, dry needling, taping, vasopnuematic device, vestibular, spinal manipulations, joint manipulations  PLAN FOR NEXT SESSION: neck and posture strengthening, manual therapy and or heat to neck if desired. History of fusions in neck/back   Debbe Odea, PT, DPT 01/09/2023, 11:38 AM

## 2023-01-11 ENCOUNTER — Ambulatory Visit: Payer: Medicare Other | Admitting: Rehabilitative and Restorative Service Providers"

## 2023-01-11 ENCOUNTER — Encounter: Payer: Medicare Other | Admitting: Physical Therapy

## 2023-01-11 ENCOUNTER — Encounter: Payer: Self-pay | Admitting: Rehabilitative and Restorative Service Providers"

## 2023-01-11 DIAGNOSIS — M5459 Other low back pain: Secondary | ICD-10-CM | POA: Diagnosis not present

## 2023-01-11 DIAGNOSIS — M6281 Muscle weakness (generalized): Secondary | ICD-10-CM

## 2023-01-11 DIAGNOSIS — M542 Cervicalgia: Secondary | ICD-10-CM | POA: Diagnosis not present

## 2023-01-11 NOTE — Therapy (Signed)
OUTPATIENT PHYSICAL THERAPY TREATMENT    Patient Name: Catherine Gardner MRN: [id number] DOB:07-20-1961, 62 y.o., female Today's Date: 01/11/2023  END OF SESSION:  PT End of Session - 01/11/23 1425     Visit Number 11    Number of Visits 18    Date for PT Re-Evaluation 02/06/23    Authorization Type MCR    Progress Note Due on Visit 20    PT Start Time 1427    PT Stop Time 1457    PT Time Calculation (min) 30 min    Activity Tolerance Patient tolerated treatment well    Behavior During Therapy [facility] for tasks assessed/performed            Past Medical History:  Diagnosis Date   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   ANXIETY 12/27/2010   Arachnoiditis    ARTHRITIS 10/31/2007   Chronic lower back pain 10/28/2013   DEPRESSION 10/31/2007   FIBROMYALGIA 10/31/2007   Headache(784.0) 07/12/2009   HYPERLIPIDEMIA 10/31/2007   HYPERTENSION 10/31/2007   INSOMNIA 06/02/2010   LOW BACK PAIN 10/31/2007   Osteoporosis 10/31/2007   POLYARTHRALGIA 10/31/2007   RASH-NONVESICULAR 06/02/2010   SHINGLES 11/09/2009   SINUSITIS- ACUTE-NOS 12/20/2009   VITAMIN D DEFICIENCY 07/28/2009   Past Surgical History:  Procedure Laterality Date   ABDOMINAL HYSTERECTOMY     BREAST BIOPSY Left 02/26/2009   BREAST BIOPSY Left 01/03/2023   MM LT BREAST BX W LOC DEV 1ST LESION IMAGE BX SPEC STEREO GUIDE 01/03/2023 [facility]   implant     neuro spine stimulator   LUMBAR FUSION  2008   s/p diskectomy  2004   L5   s/p right foot morton's neuroma  Jan. 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   Memory loss 03/25/2015   Chronic pain syndrome  03/25/2015   Memory dysfunction 02/16/2015   Nonallopathic lesion of cervical region 12/25/2014   Trigger point of left shoulder region 12/05/2014   Whiplash injuries 12/04/2014   Acute hemorrhoid 10/04/2014   Fractured coccyx ([place]) 10/04/2014   Hormone replacement therapy 05/01/2014   Nonallopathic lesion of thoracic region 03/02/2014   Nonallopathic lesion of lumbosacral region 03/02/2014   Nonallopathic lesion of sacral region 03/02/2014   Trapezius muscle spasm 02/23/2014   Menopausal symptoms 10/28/2013   ANA positive 10/28/2013   Chronic lower back pain 10/28/2013   Varicose veins of lower extremities with other complications [id number]   Varicose veins with pain 05/06/2013   Myalgia and myositis 04/30/2013   Abdominal pain, other specified site 09/16/2012   Polyarthralgia 09/16/2012   Palpitations 10/26/2011   Back pain 10/26/2011   Dyspepsia and other specified disorders of function of stomach 06/06/2011   Encounter for well adult exam with abnormal findings 02/27/2011   GERD (gastroesophageal reflux disease) 02/27/2011   ANXIETY 12/27/2010   INSOMNIA 06/02/2010   RASH-NONVESICULAR 06/02/2010   SHINGLES 11/09/2009   VITAMIN D DEFICIENCY 07/28/2009   Hyperlipidemia 10/31/2007   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   Depression 10/31/2007   Essential hypertension 10/31/2007   ARTHRITIS 10/31/2007   POLYARTHRALGIA 10/31/2007   LOW BACK PAIN 10/31/2007   Fibromyalgia 10/31/2007   Osteoporosis 10/31/2007    PCP:  Leeroy Cha, MD   REFERRING PROVIDER: Murlean Iba, MD  REFERRING DIAG: [phone] (ICD-10-CM) - Fusion of spine, cervical region Lumbar spine and left leg strengthening  THERAPY DIAG:  Cervicalgia  Other low back pain  Muscle weakness (generalized)  Rationale for Evaluation and Treatment: Rehabilitation  ONSET DATE: chronic neck and back pain, most recent surgery was neck fusion 08/09/22  SUBJECTIVE:                                                                                                                                                                                                          SUBJECTIVE STATEMENT: She indicated doing pretty good and feeling better.  1-2/10 upon arrival in Lt neck.   PERTINENT HISTORY:  [id number] 08/09/22, lumbar fusion, lumbar spinal stimulator, chronic pain, depression, fibromyalgia, osteoporosis  PAIN:  Pain scale: 1-2/10 Location: Lt cervical  PRECAUTIONS: Cervical/Lumbar Fusion  WEIGHT BEARING RESTRICTIONS: No  FALLS:  Has patient fallen in last 6 months? Yes. Number of falls 1, tripped on curb and fell 11/26/22 on knee caps  and forearms so some left shoulder pain since thi  OCCUPATION: work from home  PLOF: Independent  PATIENT GOALS: reduce pain, feel better, turn her head more to the left  NEXT MD VISIT: 12/01/21  OBJECTIVE:   DIAGNOSTIC FINDINGS:  Will have XR when she sees MD again 12/01/21  PATIENT SURVEYS:  Eval: FOTO 52% functional, goal is 54% 01/09/23: FOTO improved to 57% and met goal for this  COGNITION: Overall cognitive status: Within functional limits for tasks assessed  SENSATION: WFL  POSTURE: rounded shoulders  PALPATION: Eval: TTP in Cervical-thoracic-lumbar spine, in left shoulder and scapula, upper trap   CERVICAL ROM:   Active ROM AROM (deg) eval AROM 12/11/22 AROM 01/09/23  Flexion 30 42 42  Extension 20 35 40  Right lateral flexion 20 28 30  $ Left lateral flexion 10 30 25  $ Right rotation 60 68 80  Left rotation 30 48 68   (Blank rows = not tested)  Lumbar ROM at EVAL Flexion: 50% Extension: 75% Rotation: WNL   UPPER EXTREMITY ROM:  Active ROM Right eval Left eval Right/Left 01/09/23   Shoulder flexion 120 110 140/140  Shoulder extension     Shoulder abduction 120 110 140/140  Shoulder adduction     Shoulder extension     Shoulder internal rotation WNL WNL   Shoulder external rotation WNL WNL   Elbow  flexion     Elbow extension     Wrist flexion     Wrist extension  Wrist ulnar deviation     Wrist radial deviation     Wrist pronation     Wrist supination      (Blank rows = not tested)  UPPER/LOWER EXTREMITY MMT:  MMT Right eval Left eval Right/Left 01/09/23  Shoulder flexion 4 4 4+/4+  Shoulder extension     Shoulder abduction 4 3 4+/4  Shoulder adduction     Shoulder extension     Shoulder internal rotation 5 5 5/5  Shoulder external rotation 5  5/4+       Hip flexion in sitting 4+ 3   Hip abd in sitting 4+ 4+   Knee extension 5 4-   Knee flexion 5 4   Wrist extension     Wrist ulnar deviation     Wrist radial deviation     Wrist pronation     Wrist supination     Grip strength   4+/4+   (Blank rows = not tested)  SPECIAL TESTS:  Spurling's test: Negative SLUMP test + on left, inconclusive on Rt  FUNCTIONAL TESTS:  12/04/21:  5 time sit to stand: 26 seconds c UE support  TODAY'S TREATMENT:  01/11/2023 TherEx:  Nu step L6 X 10 min UE/LE Rows: 2 x 15  green  band Shoulder extension 2 x 5 green band Bilat shoulder ER with scap retraction red band 2X10 Bilat shoulder horizontal abd with red band 2X10 Standing shoulder flexion 2 lbs x 10, 1 lb x 10  Standing hip abd with red X 15 bilat Standing hip extension with red X 15 bilat   01/09/2023 TherEx:  Nu step L6 X 8 min UE/LE Rows: x 20 green  band Shoulder extension X 20 green band Bilat shoulder ER with scap retraction red band 2X10 Bilat shoulder horizontal abd with red band 2X10 Seated shoulder flexion 2# X10 Seated shoulder abduction 2# 2X10 Standing hip abd with red X 15 bilat Standing hip extension with red X 15 bilat Updated measurements, FOTO, goals for progress ntoe  Modalities: moist heat to neck X 7 min in seated not included in treatment billing time    PATIENT EDUCATION: Education details: HEP, PT plan of care Person educated: Patient Education method: Explanation, Demonstration,  Verbal cues, and Handouts Education comprehension: verbalized understanding and needs further education   HOME EXERCISE PROGRAM: Access Code: [email or web address] URL: [email or web address] Date: 11/28/2022 Prepared by: Elsie Ra  Exercises - Seated Assisted Cervical Rotation with Towel  - 2 x daily - 6 x weekly - 1 sets - 10 reps - 5 hold - Seated Cervical Sidebending Stretch (Mirrored)  - 2 x daily - 6 x weekly - 1 sets - 3 reps - 20 sec hold - Standing Scapular Retraction with External Rotation  - 2 x daily - 6 x weekly - 1-2 sets - 10 reps - Slump Stretch  - 2 x daily - 6 x weekly - 1-2 sets - 10 reps - 3 hold - Seated Straight Leg Raise with Quad Contraction  - 2 x daily - 6 x weekly - 1-2 sets - 10 reps - Sit to Stand  - 2 x daily - 6 x weekly - 1-2 sets - 10 reps  ASSESSMENT:  CLINICAL IMPRESSION:  Pt continued to report improvements overall with reduced pain complaints compared to previous.  Some muscle weakness/fatigue still noted in UE/LE that may benefit from continued improvements.   OBJECTIVE IMPAIRMENTS: decreased activity tolerance, difficulty walking,  decreased endurance, decreased mobility, decreased ROM, decreased strength, impaired flexibility,  impaired UE/LE use, postural dysfunction, and pain.  ACTIVITY LIMITATIONS: bending, lifting, carry, locomotion, cleaning, community activity, driving, and or occupation  PERSONAL FACTORS: [id number] 08/09/22, lumbar fusion, lumbar spinal stimulator, chronic pain, depression, fibromyalgia, osteoporosis are also affecting patient's functional outcome.  REHAB POTENTIAL: Good  CLINICAL DECISION MAKING: Evolving/moderate complexity  EVALUATION COMPLEXITY: Moderate    GOALS: Short term PT Goals Target date: 12/26/2022   Pt will be I and compliant with HEP. Baseline:  Goal status: MET 01/09/23 Pt will improve Left cervical rotation to 45 deg Baseline:30 Goal status: MET 01/09/23  Long term PT goals Target  date:02/06/2023   Pt will neck left cervical rotation ROM to 60 deg to improve scanning for driving Baseline: Goal status: MET 01/09/23 Pt will improve  Left hip/knee strength to at least 4/5 MMT to improve functional strength Baseline:3 Goal status: ongoing Pt will improve FOTO to at least 54% functional to show improved function Baseline: Goal status: MET 01/09/23, improved to 57% Pt will reduce pain by overall 50% overall with usual activity Baseline: Goal status: ongoing Pt will reduce pain to overall less than 2-3/10 with usual activity, work, and sleeping Baseline: Goal status: ongoing  PLAN: PT FREQUENCY: 1-2 times per week   PT DURATION: 6 weeks  PLANNED INTERVENTIONS (unless contraindicated): aquatic PT, Canalith repositioning, cryotherapy, Electrical stimulation, Iontophoresis with 4 mg/ml dexamethasome, Moist heat, traction, Ultrasound, gait training, Therapeutic exercise, balance training, neuromuscular re-education, patient/family education, prosthetic training, manual techniques, passive ROM, dry needling, taping, vasopnuematic device, vestibular, spinal manipulations, joint manipulations  PLAN FOR NEXT SESSION: neck and posture strengthening.    History of fusions in neck/back   Scot Jun, PT, DPT, OCS, ATC 01/11/23  3:03 PM

## 2023-01-16 ENCOUNTER — Encounter: Payer: Self-pay | Admitting: Physical Therapy

## 2023-01-16 ENCOUNTER — Ambulatory Visit: Payer: Medicare Other | Admitting: Physical Therapy

## 2023-01-16 DIAGNOSIS — M6281 Muscle weakness (generalized): Secondary | ICD-10-CM

## 2023-01-16 DIAGNOSIS — M542 Cervicalgia: Secondary | ICD-10-CM

## 2023-01-16 DIAGNOSIS — M5459 Other low back pain: Secondary | ICD-10-CM | POA: Diagnosis not present

## 2023-01-16 NOTE — Therapy (Signed)
OUTPATIENT PHYSICAL THERAPY TREATMENT    Patient Name: Catherine Gardner MRN: [id number] DOB:1961-07-07, 62 y.o., female Today's Date: 01/16/2023  END OF SESSION:  PT End of Session - 01/16/23 1107     Visit Number 12    Number of Visits 18    Date for PT Re-Evaluation 02/06/23    Authorization Type MCR    Progress Note Due on Visit 20    PT Start Time 1100    PT Stop Time 1138    PT Time Calculation (min) 38 min    Activity Tolerance Patient tolerated treatment well    Behavior During Therapy WFL for tasks assessed/performed             Past Medical History:  Diagnosis Date   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   ANXIETY 12/27/2010   Arachnoiditis    ARTHRITIS 10/31/2007   Chronic lower back pain 10/28/2013   DEPRESSION 10/31/2007   FIBROMYALGIA 10/31/2007   Headache(784.0) 07/12/2009   HYPERLIPIDEMIA 10/31/2007   HYPERTENSION 10/31/2007   INSOMNIA 06/02/2010   LOW BACK PAIN 10/31/2007   Osteoporosis 10/31/2007   POLYARTHRALGIA 10/31/2007   RASH-NONVESICULAR 06/02/2010   SHINGLES 11/09/2009   SINUSITIS- ACUTE-NOS 12/20/2009   VITAMIN D DEFICIENCY 07/28/2009   Past Surgical History:  Procedure Laterality Date   ABDOMINAL HYSTERECTOMY     BREAST BIOPSY Left 02/26/2009   BREAST BIOPSY Left 01/03/2023   MM LT BREAST BX W LOC DEV 1ST LESION IMAGE BX SPEC STEREO GUIDE 01/03/2023 [facility]   implant     neuro spine stimulator   LUMBAR FUSION  2008   s/p diskectomy  2004   L5   s/p right foot morton's neuroma  Jan. 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   Memory loss 03/25/2015   Chronic pain  syndrome 03/25/2015   Memory dysfunction 02/16/2015   Nonallopathic lesion of cervical region 12/25/2014   Trigger point of left shoulder region 12/05/2014   Whiplash injuries 12/04/2014   Acute hemorrhoid 10/04/2014   Fractured coccyx ([place]) 10/04/2014   Hormone replacement therapy 05/01/2014   Nonallopathic lesion of thoracic region 03/02/2014   Nonallopathic lesion of lumbosacral region 03/02/2014   Nonallopathic lesion of sacral region 03/02/2014   Trapezius muscle spasm 02/23/2014   Menopausal symptoms 10/28/2013   ANA positive 10/28/2013   Chronic lower back pain 10/28/2013   Varicose veins of lower extremities with other complications [id number]   Varicose veins with pain 05/06/2013   Myalgia and myositis 04/30/2013   Abdominal pain, other specified site 09/16/2012   Polyarthralgia 09/16/2012   Palpitations 10/26/2011   Back pain 10/26/2011   Dyspepsia and other specified disorders of function of stomach 06/06/2011   Encounter for well adult exam with abnormal findings 02/27/2011   GERD (gastroesophageal reflux disease) 02/27/2011   ANXIETY 12/27/2010   INSOMNIA 06/02/2010   RASH-NONVESICULAR 06/02/2010   SHINGLES 11/09/2009   VITAMIN D DEFICIENCY 07/28/2009   Hyperlipidemia 10/31/2007   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   Depression 10/31/2007   Essential hypertension 10/31/2007   ARTHRITIS 10/31/2007   POLYARTHRALGIA 10/31/2007   LOW BACK PAIN 10/31/2007   Fibromyalgia 10/31/2007   Osteoporosis 10/31/2007  PCP: Leeroy Cha, MD   REFERRING PROVIDER: Murlean Iba, MD  REFERRING DIAG: [phone] (ICD-10-CM) - Fusion of spine, cervical region Lumbar spine and left leg strengthening  THERAPY DIAG:  Cervicalgia  Other low back pain  Muscle weakness (generalized)  Rationale for Evaluation and Treatment: Rehabilitation  ONSET DATE: chronic neck and back pain, most recent surgery was neck fusion 08/09/22  SUBJECTIVE:                                                                                                                                                                                                          SUBJECTIVE STATEMENT: She indicated pain in her neck and back are doing good overall but does have some neck pain sometimes with rotation.   PERTINENT HISTORY:  [id number] 08/09/22, lumbar fusion, lumbar spinal stimulator, chronic pain, depression, fibromyalgia, osteoporosis  PAIN:  Pain scale: 2/10 Location: Lt cervical  PRECAUTIONS: Cervical/Lumbar Fusion  WEIGHT BEARING RESTRICTIONS: No  FALLS:  Has patient fallen in last 6 months? Yes. Number of falls 1, tripped on curb and fell 11/26/22 on knee caps  and forearms so some left shoulder pain since thi  OCCUPATION: work from home  PLOF: Independent  PATIENT GOALS: reduce pain, feel better, turn her head more to the left  NEXT MD VISIT: 12/01/21  OBJECTIVE:   DIAGNOSTIC FINDINGS:  Will have XR when she sees MD again 12/01/21  PATIENT SURVEYS:  Eval: FOTO 52% functional, goal is 54% 01/09/23: FOTO improved to 57% and met goal for this  COGNITION: Overall cognitive status: Within functional limits for tasks assessed  SENSATION: WFL  POSTURE: rounded shoulders  PALPATION: Eval: TTP in Cervical-thoracic-lumbar spine, in left shoulder and scapula, upper trap   CERVICAL ROM:   Active ROM AROM (deg) eval AROM 12/11/22 AROM 01/09/23  Flexion 30 42 42  Extension 20 35 40  Right lateral flexion 20 28 30  $ Left lateral flexion 10 30 25  $ Right rotation 60 68 80  Left rotation 30 48 68   (Blank rows = not tested)  Lumbar ROM at EVAL Flexion: 50% Extension: 75% Rotation: WNL   UPPER EXTREMITY ROM:  Active ROM Right eval Left eval Right/Left 01/09/23   Shoulder flexion 120 110 140/140  Shoulder extension     Shoulder abduction 120 110 140/140  Shoulder adduction     Shoulder extension     Shoulder internal rotation WNL WNL   Shoulder  external rotation WNL WNL   Elbow flexion     Elbow extension     Wrist flexion  Wrist extension     Wrist ulnar deviation     Wrist radial deviation     Wrist pronation     Wrist supination      (Blank rows = not tested)  UPPER/LOWER EXTREMITY MMT:  MMT Right eval Left eval Right/Left 01/09/23  Shoulder flexion 4 4 4+/4+  Shoulder extension     Shoulder abduction 4 3 4+/4  Shoulder adduction     Shoulder extension     Shoulder internal rotation 5 5 5/5  Shoulder external rotation 5  5/4+       Hip flexion in sitting 4+ 3   Hip abd in sitting 4+ 4+   Knee extension 5 4-   Knee flexion 5 4   Wrist extension     Wrist ulnar deviation     Wrist radial deviation     Wrist pronation     Wrist supination     Grip strength   4+/4+   (Blank rows = not tested)  SPECIAL TESTS:  Spurling's test: Negative SLUMP test + on left, inconclusive on Rt  FUNCTIONAL TESTS:  12/04/21:  5 time sit to stand: 26 seconds c UE support  TODAY'S TREATMENT:  01/16/2023 TherEx:  Sci fit bike L2 X 6 min UE/LE together Rows: 2 x 15  green  band Shoulder extension 2 x 15 green band Bilat shoulder ER with scap retraction red band 2X10 Bilat shoulder horizontal abd with red band 2X10 Standing shoulder flexion 2 lbs x 10, 1 lb x 10  Standing shoulder abduction 2 lbs X 10, 1 lb X 10 Standing bilat shoulder flexion going overhead with 2# bar  Standing hip abd with red X 15 bilat Standing hip extension with red X 15 bilat Nu step L 6 X 5 min UE/LE  01/11/2023 TherEx:  Nu step L6 X 10 min UE/LE Rows: 2 x 15  green  band Shoulder extension 2 x 5 green band Bilat shoulder ER with scap retraction red band 2X10 Bilat shoulder horizontal abd with red band 2X10 Standing shoulder flexion 2 lbs x 10, 1 lb x 10  Standing hip abd with red X 15 bilat Standing hip extension with red X 15 bilat   01/09/2023 TherEx:  Nu step L6 X 8 min UE/LE Rows: x 20 green  band Shoulder extension X 20 green  band Bilat shoulder ER with scap retraction red band 2X10 Bilat shoulder horizontal abd with red band 2X10 Seated shoulder flexion 2# X10 Seated shoulder abduction 2# 2X10 Standing hip abd with red X 15 bilat Standing hip extension with red X 15 bilat Updated measurements, FOTO, goals for progress ntoe  Modalities: moist heat to neck X 7 min in seated not included in treatment billing time    PATIENT EDUCATION: Education details: HEP, PT plan of care Person educated: Patient Education method: Explanation, Demonstration, Verbal cues, and Handouts Education comprehension: verbalized understanding and needs further education   HOME EXERCISE PROGRAM: Access Code: [email or web address] URL: [email or web address] Date: 11/28/2022 Prepared by: Elsie Ra  Exercises - Seated Assisted Cervical Rotation with Towel  - 2 x daily - 6 x weekly - 1 sets - 10 reps - 5 hold - Seated Cervical Sidebending Stretch (Mirrored)  - 2 x daily - 6 x weekly - 1 sets - 3 reps - 20 sec hold - Standing Scapular Retraction with External Rotation  - 2 x daily - 6 x weekly - 1-2 sets - 10 reps - Slump Stretch  - 2 x daily -  6 x weekly - 1-2 sets - 10 reps - 3 hold - Seated Straight Leg Raise with Quad Contraction  - 2 x daily - 6 x weekly - 1-2 sets - 10 reps - Sit to Stand  - 2 x daily - 6 x weekly - 1-2 sets - 10 reps  ASSESSMENT:  CLINICAL IMPRESSION:  We continued to work to improve her strength and overall functional abilities for ADL's. She continues to progress in this area and we will work to gradually progress her as tolerated.   OBJECTIVE IMPAIRMENTS: decreased activity tolerance, difficulty walking,  decreased endurance, decreased mobility, decreased ROM, decreased strength, impaired flexibility, impaired UE/LE use, postural dysfunction, and pain.  ACTIVITY LIMITATIONS: bending, lifting, carry, locomotion, cleaning, community activity, driving, and or occupation  PERSONAL FACTORS: [id number]  08/09/22, lumbar fusion, lumbar spinal stimulator, chronic pain, depression, fibromyalgia, osteoporosis are also affecting patient's functional outcome.  REHAB POTENTIAL: Good  CLINICAL DECISION MAKING: Evolving/moderate complexity  EVALUATION COMPLEXITY: Moderate    GOALS: Short term PT Goals Target date: 12/26/2022   Pt will be I and compliant with HEP. Baseline:  Goal status: MET 01/09/23 Pt will improve Left cervical rotation to 45 deg Baseline:30 Goal status: MET 01/09/23  Long term PT goals Target date:02/06/2023   Pt will neck left cervical rotation ROM to 60 deg to improve scanning for driving Baseline: Goal status: MET 01/09/23 Pt will improve  Left hip/knee strength to at least 4/5 MMT to improve functional strength Baseline:3 Goal status: ongoing Pt will improve FOTO to at least 54% functional to show improved function Baseline: Goal status: MET 01/09/23, improved to 57% Pt will reduce pain by overall 50% overall with usual activity Baseline: Goal status: ongoing Pt will reduce pain to overall less than 2-3/10 with usual activity, work, and sleeping Baseline: Goal status: ongoing  PLAN: PT FREQUENCY: 1-2 times per week   PT DURATION: 6 weeks  PLANNED INTERVENTIONS (unless contraindicated): aquatic PT, Canalith repositioning, cryotherapy, Electrical stimulation, Iontophoresis with 4 mg/ml dexamethasome, Moist heat, traction, Ultrasound, gait training, Therapeutic exercise, balance training, neuromuscular re-education, patient/family education, prosthetic training, manual techniques, passive ROM, dry needling, taping, vasopnuematic device, vestibular, spinal manipulations, joint manipulations  PLAN FOR NEXT SESSION: shoulder/neck and posture strengthening.    History of fusions in neck/back  Elsie Ra, PT, DPT 01/16/23 11:29 AM

## 2023-01-18 ENCOUNTER — Ambulatory Visit: Payer: Medicare Other | Admitting: Physical Therapy

## 2023-01-18 ENCOUNTER — Encounter: Payer: Self-pay | Admitting: Physical Therapy

## 2023-01-18 DIAGNOSIS — M542 Cervicalgia: Secondary | ICD-10-CM

## 2023-01-18 DIAGNOSIS — M5459 Other low back pain: Secondary | ICD-10-CM | POA: Diagnosis not present

## 2023-01-18 DIAGNOSIS — M6281 Muscle weakness (generalized): Secondary | ICD-10-CM

## 2023-01-18 NOTE — Therapy (Signed)
OUTPATIENT PHYSICAL THERAPY TREATMENT    Patient Name: Catherine Gardner MRN: [id number] DOB:1961-06-16, 62 y.o., female Today's Date: 01/18/2023  END OF SESSION:  PT End of Session - 01/18/23 1134     Visit Number 13    Number of Visits 18    Date for PT Re-Evaluation 02/06/23    Authorization Type MCR    Progress Note Due on Visit 20    PT Start Time 1104    PT Stop Time 1134    PT Time Calculation (min) 30 min    Activity Tolerance Patient tolerated treatment well    Behavior During Therapy [facility] for tasks assessed/performed              Past Medical History:  Diagnosis Date   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   ANXIETY 12/27/2010   Arachnoiditis    ARTHRITIS 10/31/2007   Chronic lower back pain 10/28/2013   DEPRESSION 10/31/2007   FIBROMYALGIA 10/31/2007   Headache(784.0) 07/12/2009   HYPERLIPIDEMIA 10/31/2007   HYPERTENSION 10/31/2007   INSOMNIA 06/02/2010   LOW BACK PAIN 10/31/2007   Osteoporosis 10/31/2007   POLYARTHRALGIA 10/31/2007   RASH-NONVESICULAR 06/02/2010   SHINGLES 11/09/2009   SINUSITIS- ACUTE-NOS 12/20/2009   VITAMIN D DEFICIENCY 07/28/2009   Past Surgical History:  Procedure Laterality Date   ABDOMINAL HYSTERECTOMY     BREAST BIOPSY Left 02/26/2009   BREAST BIOPSY Left 01/03/2023   MM LT BREAST BX W LOC DEV 1ST LESION IMAGE BX SPEC STEREO GUIDE 01/03/2023 [facility]   implant     neuro spine stimulator   LUMBAR FUSION  2008   s/p diskectomy  2004   L5   s/p right foot morton's neuroma  Jan. 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   Memory loss 03/25/2015   Chronic pain  syndrome 03/25/2015   Memory dysfunction 02/16/2015   Nonallopathic lesion of cervical region 12/25/2014   Trigger point of left shoulder region 12/05/2014   Whiplash injuries 12/04/2014   Acute hemorrhoid 10/04/2014   Fractured coccyx ([place]) 10/04/2014   Hormone replacement therapy 05/01/2014   Nonallopathic lesion of thoracic region 03/02/2014   Nonallopathic lesion of lumbosacral region 03/02/2014   Nonallopathic lesion of sacral region 03/02/2014   Trapezius muscle spasm 02/23/2014   Menopausal symptoms 10/28/2013   ANA positive 10/28/2013   Chronic lower back pain 10/28/2013   Varicose veins of lower extremities with other complications [id number]   Varicose veins with pain 05/06/2013   Myalgia and myositis 04/30/2013   Abdominal pain, other specified site 09/16/2012   Polyarthralgia 09/16/2012   Palpitations 10/26/2011   Back pain 10/26/2011   Dyspepsia and other specified disorders of function of stomach 06/06/2011   Encounter for well adult exam with abnormal findings 02/27/2011   GERD (gastroesophageal reflux disease) 02/27/2011   ANXIETY 12/27/2010   INSOMNIA 06/02/2010   RASH-NONVESICULAR 06/02/2010   SHINGLES 11/09/2009   VITAMIN D DEFICIENCY 07/28/2009   Hyperlipidemia 10/31/2007   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   Depression 10/31/2007   Essential hypertension 10/31/2007   ARTHRITIS 10/31/2007   POLYARTHRALGIA 10/31/2007   LOW BACK PAIN 10/31/2007   Fibromyalgia 10/31/2007   Osteoporosis 10/31/2007  PCP: Leeroy Cha, MD   REFERRING PROVIDER: Murlean Iba, MD  REFERRING DIAG: [phone] (ICD-10-CM) - Fusion of spine, cervical region Lumbar spine and left leg strengthening  THERAPY DIAG:  Cervicalgia  Other low back pain  Muscle weakness (generalized)  Rationale for Evaluation and Treatment: Rehabilitation  ONSET DATE: chronic neck and back pain, most recent surgery was neck fusion 08/09/22  SUBJECTIVE:                                                                                                                                                                                                          SUBJECTIVE STATEMENT: She had some soreness today, she says might have slept wrong or can tell it is about to rain.   PERTINENT HISTORY:  [id number] 08/09/22, lumbar fusion, lumbar spinal stimulator, chronic pain, depression, fibromyalgia, osteoporosis  PAIN:  Pain scale: 4/10 Location: Lt cervical  PRECAUTIONS: Cervical/Lumbar Fusion  WEIGHT BEARING RESTRICTIONS: No  FALLS:  Has patient fallen in last 6 months? Yes. Number of falls 1, tripped on curb and fell 11/26/22 on knee caps  and forearms so some left shoulder pain since thi  OCCUPATION: work from home  PLOF: Independent  PATIENT GOALS: reduce pain, feel better, turn her head more to the left  NEXT MD VISIT: 12/01/21  OBJECTIVE:   DIAGNOSTIC FINDINGS:  Will have XR when she sees MD again 12/01/21  PATIENT SURVEYS:  Eval: FOTO 52% functional, goal is 54% 01/09/23: FOTO improved to 57% and met goal for this  COGNITION: Overall cognitive status: Within functional limits for tasks assessed  SENSATION: WFL  POSTURE: rounded shoulders  PALPATION: Eval: TTP in Cervical-thoracic-lumbar spine, in left shoulder and scapula, upper trap   CERVICAL ROM:   Active ROM AROM (deg) eval AROM 12/11/22 AROM 01/09/23  Flexion 30 42 42  Extension 20 35 40  Right lateral flexion 20 28 30  $ Left lateral flexion 10 30 25  $ Right rotation 60 68 80  Left rotation 30 48 68   (Blank rows = not tested)  Lumbar ROM at EVAL Flexion: 50% Extension: 75% Rotation: WNL   UPPER EXTREMITY ROM:  Active ROM Right eval Left eval Right/Left 01/09/23   Shoulder flexion 120 110 140/140  Shoulder extension     Shoulder abduction 120 110 140/140  Shoulder adduction     Shoulder extension     Shoulder internal rotation WNL WNL   Shoulder external rotation WNL WNL    Elbow flexion     Elbow extension     Wrist flexion  Wrist extension     Wrist ulnar deviation     Wrist radial deviation     Wrist pronation     Wrist supination      (Blank rows = not tested)  UPPER/LOWER EXTREMITY MMT:  MMT Right eval Left eval Right/Left 01/09/23  Shoulder flexion 4 4 4+/4+  Shoulder extension     Shoulder abduction 4 3 4+/4  Shoulder adduction     Shoulder extension     Shoulder internal rotation 5 5 5/5  Shoulder external rotation 5  5/4+       Hip flexion in sitting 4+ 3   Hip abd in sitting 4+ 4+   Knee extension 5 4-   Knee flexion 5 4   Wrist extension     Wrist ulnar deviation     Wrist radial deviation     Wrist pronation     Wrist supination     Grip strength   4+/4+   (Blank rows = not tested)  SPECIAL TESTS:  Spurling's test: Negative SLUMP test + on left, inconclusive on Rt  FUNCTIONAL TESTS:  12/04/21:  5 time sit to stand: 26 seconds c UE support  TODAY'S TREATMENT:  01/18/2023 TherEx:  Sci fit bike L2 X 6 min UE/LE together Rows: 2 x 15  green  band Shoulder extension 2 x 15 green band Bilat shoulder ER with scap retraction red band 2X10 Bilat shoulder horizontal abd with red band 2X10 Standing shoulder flexion 2 lbs x 10,  Standing shoulder abduction 2 lbs X 10 Standing bilat shoulder flexion going overhead with 2# bar, X 10 reps  Standing hip abd with red X 15 bilat Standing hip extension with red X 15 bilat Nu step (unable to perform today as machine was occupied  Pt defers heat  01/16/2023 TherEx:  Sci fit bike L2 X 6 min UE/LE together Rows: 2 x 15  green  band Shoulder extension 2 x 15 green band Bilat shoulder ER with scap retraction red band 2X10 Bilat shoulder horizontal abd with red band 2X10 Standing shoulder flexion 2 lbs x 10, 1 lb x 10  Standing shoulder abduction 2 lbs X 10, 1 lb X 10 Standing bilat shoulder flexion going overhead with 2# bar  Standing hip abd with red X 15 bilat Standing hip  extension with red X 15 bilat Nu step L 6 X 5 min UE/LE    PATIENT EDUCATION: Education details: HEP, PT plan of care Person educated: Patient Education method: Explanation, Demonstration, Verbal cues, and Handouts Education comprehension: verbalized understanding and needs further education   HOME EXERCISE PROGRAM: Access Code: [email or web address] URL: [email or web address] Date: 11/28/2022 Prepared by: Elsie Ra  Exercises - Seated Assisted Cervical Rotation with Towel  - 2 x daily - 6 x weekly - 1 sets - 10 reps - 5 hold - Seated Cervical Sidebending Stretch (Mirrored)  - 2 x daily - 6 x weekly - 1 sets - 3 reps - 20 sec hold - Standing Scapular Retraction with External Rotation  - 2 x daily - 6 x weekly - 1-2 sets - 10 reps - Slump Stretch  - 2 x daily - 6 x weekly - 1-2 sets - 10 reps - 3 hold - Seated Straight Leg Raise with Quad Contraction  - 2 x daily - 6 x weekly - 1-2 sets - 10 reps - Sit to Stand  - 2 x daily - 6 x weekly - 1-2 sets - 10 reps Added 01/18/23 -Hip abd  with red band X 15 bilat 1 set per day -Hip ext with red band X 15 bilat 1 set per day  ASSESSMENT:  CLINICAL IMPRESSION:  Contineud with strength program with fair overall tolerance to this. She was overall more sore so did regress some with the intensity. PT provided her with red band to add in the hip strength exercises at home as well. Continue current plan.   OBJECTIVE IMPAIRMENTS: decreased activity tolerance, difficulty walking,  decreased endurance, decreased mobility, decreased ROM, decreased strength, impaired flexibility, impaired UE/LE use, postural dysfunction, and pain.  ACTIVITY LIMITATIONS: bending, lifting, carry, locomotion, cleaning, community activity, driving, and or occupation  PERSONAL FACTORS: [id number] 08/09/22, lumbar fusion, lumbar spinal stimulator, chronic pain, depression, fibromyalgia, osteoporosis are also affecting patient's functional outcome.  REHAB POTENTIAL:  Good  CLINICAL DECISION MAKING: Evolving/moderate complexity  EVALUATION COMPLEXITY: Moderate    GOALS: Short term PT Goals Target date: 12/26/2022   Pt will be I and compliant with HEP. Baseline:  Goal status: MET 01/09/23 Pt will improve Left cervical rotation to 45 deg Baseline:30 Goal status: MET 01/09/23  Long term PT goals Target date:02/06/2023   Pt will neck left cervical rotation ROM to 60 deg to improve scanning for driving Baseline: Goal status: MET 01/09/23 Pt will improve  Left hip/knee strength to at least 4/5 MMT to improve functional strength Baseline:3 Goal status: ongoing Pt will improve FOTO to at least 54% functional to show improved function Baseline: Goal status: MET 01/09/23, improved to 57% Pt will reduce pain by overall 50% overall with usual activity Baseline: Goal status: ongoing Pt will reduce pain to overall less than 2-3/10 with usual activity, work, and sleeping Baseline: Goal status: ongoing  PLAN: PT FREQUENCY: 1-2 times per week   PT DURATION: 6 weeks  PLANNED INTERVENTIONS (unless contraindicated): aquatic PT, Canalith repositioning, cryotherapy, Electrical stimulation, Iontophoresis with 4 mg/ml dexamethasome, Moist heat, traction, Ultrasound, gait training, Therapeutic exercise, balance training, neuromuscular re-education, patient/family education, prosthetic training, manual techniques, passive ROM, dry needling, taping, vasopnuematic device, vestibular, spinal manipulations, joint manipulations  PLAN FOR NEXT SESSION: shoulder/neck and posture strengthening.    History of fusions in neck/back  Elsie Ra, PT, DPT 01/18/23 11:34 AM

## 2023-01-22 ENCOUNTER — Encounter: Payer: Medicare Other | Admitting: Physical Therapy

## 2023-01-23 ENCOUNTER — Encounter: Payer: Medicare Other | Admitting: Physical Therapy

## 2023-01-25 ENCOUNTER — Encounter: Payer: Self-pay | Admitting: Physical Therapy

## 2023-01-25 ENCOUNTER — Ambulatory Visit: Payer: Medicare Other | Admitting: Physical Therapy

## 2023-01-25 DIAGNOSIS — M542 Cervicalgia: Secondary | ICD-10-CM | POA: Diagnosis not present

## 2023-01-25 DIAGNOSIS — M5459 Other low back pain: Secondary | ICD-10-CM | POA: Diagnosis not present

## 2023-01-25 DIAGNOSIS — M6281 Muscle weakness (generalized): Secondary | ICD-10-CM | POA: Diagnosis not present

## 2023-01-25 NOTE — Therapy (Signed)
OUTPATIENT PHYSICAL THERAPY TREATMENT    Patient Name: Catherine Gardner MRN: [id number] DOB:[birth year], 62 y.o., female Today's Date: 01/25/2023  END OF SESSION:  PT End of Session - 01/25/23 1106     Visit Number 14    Number of Visits 18    Date for PT Re-Evaluation 02/06/23    Authorization Type MCR    Progress Note Due on Visit 20    PT Start Time 1102    PT Stop Time 1146    PT Time Calculation (min) 44 min    Activity Tolerance Patient tolerated treatment well    Behavior During Therapy WFL for tasks assessed/performed              Past Medical History:  Diagnosis Date   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   ANXIETY 12/27/2010   Arachnoiditis    ARTHRITIS 10/31/2007   Chronic lower back pain 10/28/2013   DEPRESSION 10/31/2007   FIBROMYALGIA 10/31/2007   Headache(784.0) 07/12/2009   HYPERLIPIDEMIA 10/31/2007   HYPERTENSION 10/31/2007   INSOMNIA 06/02/2010   LOW BACK PAIN 10/31/2007   Osteoporosis 10/31/2007   POLYARTHRALGIA 10/31/2007   RASH-NONVESICULAR 06/02/2010   SHINGLES 11/09/2009   SINUSITIS- ACUTE-NOS 12/20/2009   VITAMIN D DEFICIENCY 07/28/2009   Past Surgical History:  Procedure Laterality Date   ABDOMINAL HYSTERECTOMY     BREAST BIOPSY Left 02/26/2009   BREAST BIOPSY Left 01/03/2023   MM LT BREAST BX W LOC DEV 1ST LESION IMAGE BX SPEC STEREO GUIDE 01/03/2023 [facility]   implant     neuro spine stimulator   LUMBAR FUSION  2008   s/p diskectomy  2004   L5   s/p right foot morton's neuroma  Jan. 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   Memory loss 03/25/2015   Chronic pain  syndrome 03/25/2015   Memory dysfunction 02/16/2015   Nonallopathic lesion of cervical region 12/25/2014   Trigger point of left shoulder region 12/05/2014   Whiplash injuries 12/04/2014   Acute hemorrhoid 10/04/2014   Fractured coccyx ([place]) 10/04/2014   Hormone replacement therapy 05/01/2014   Nonallopathic lesion of thoracic region 03/02/2014   Nonallopathic lesion of lumbosacral region 03/02/2014   Nonallopathic lesion of sacral region 03/02/2014   Trapezius muscle spasm 02/23/2014   Menopausal symptoms 10/28/2013   ANA positive 10/28/2013   Chronic lower back pain 10/28/2013   Varicose veins of lower extremities with other complications [id number]   Varicose veins with pain 05/06/2013   Myalgia and myositis 04/30/2013   Abdominal pain, other specified site 09/16/2012   Polyarthralgia 09/16/2012   Palpitations 10/26/2011   Back pain 10/26/2011   Dyspepsia and other specified disorders of function of stomach 06/06/2011   Encounter for well adult exam with abnormal findings 02/27/2011   GERD (gastroesophageal reflux disease) 02/27/2011   ANXIETY 12/27/2010   INSOMNIA 06/02/2010   RASH-NONVESICULAR 06/02/2010   SHINGLES 11/09/2009   VITAMIN D DEFICIENCY 07/28/2009   Hyperlipidemia 10/31/2007   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   Depression 10/31/2007   Essential hypertension 10/31/2007   ARTHRITIS 10/31/2007   POLYARTHRALGIA 10/31/2007   LOW BACK PAIN 10/31/2007   Fibromyalgia 10/31/2007   Osteoporosis 10/31/2007  PCP: Leeroy Cha, MD   REFERRING PROVIDER: Murlean Iba, MD  REFERRING DIAG: [phone] (ICD-10-CM) - Fusion of spine, cervical region Lumbar spine and left leg strengthening  THERAPY DIAG:  Cervicalgia  Other low back pain  Muscle weakness (generalized)  Rationale for Evaluation and Treatment: Rehabilitation  ONSET DATE: chronic neck and back pain, most recent surgery was neck fusion 08/09/22  SUBJECTIVE:                                                                                                                                                                                                          SUBJECTIVE STATEMENT: She said her neck feels good, but her left leg is causing her more trouble now with some of the exercises at home. She reports she feels more "weary" than pain and tired.   PERTINENT HISTORY:  [id number] 08/09/22, lumbar fusion, lumbar spinal stimulator, chronic pain, depression, fibromyalgia, osteoporosis  PAIN:  Pain scale:  Location: Lt cervical  PRECAUTIONS: Cervical/Lumbar Fusion  WEIGHT BEARING RESTRICTIONS: No  FALLS:  Has patient fallen in last 6 months? Yes. Number of falls 1, tripped on curb and fell 11/26/22 on knee caps  and forearms so some left shoulder pain since thi  OCCUPATION: work from home  PLOF: Independent  PATIENT GOALS: reduce pain, feel better, turn her head more to the left  NEXT MD VISIT: 12/01/21  OBJECTIVE:   DIAGNOSTIC FINDINGS:  Will have XR when she sees MD again 12/01/21  PATIENT SURVEYS:  Eval: FOTO 52% functional, goal is 54% 01/09/23: FOTO improved to 57% and met goal for this  COGNITION: Overall cognitive status: Within functional limits for tasks assessed  SENSATION: WFL  POSTURE: rounded shoulders  PALPATION: Eval: TTP in Cervical-thoracic-lumbar spine, in left shoulder and scapula, upper trap   CERVICAL ROM:   Active ROM AROM (deg) eval AROM 12/11/22 AROM 01/09/23 AROM 01/25/23  Flexion 30 42 42 60  Extension 20 35 40 32  Right lateral flexion '20 28 30 '$ 38  Left lateral flexion '10 30 25 '$ 35*  Right rotation 60 68 80 75  Left rotation 30 48 68 62   (Blank rows = not tested) *denotes pain   Lumbar ROM at EVAL Flexion: 50% Extension: 75% Rotation: WNL   UPPER EXTREMITY ROM:  Active ROM Right eval Left eval Right/Left 01/09/23   Shoulder flexion 120 110 140/140  Shoulder extension     Shoulder abduction 120 110 140/140   Shoulder adduction     Shoulder extension     Shoulder internal rotation WNL WNL  Shoulder external rotation WNL WNL   Elbow flexion     Elbow extension     Wrist flexion     Wrist extension     Wrist ulnar deviation     Wrist radial deviation     Wrist pronation     Wrist supination      (Blank rows = not tested)  UPPER/LOWER EXTREMITY MMT:  MMT Right eval Left eval Right/Left 01/09/23 Right/Left  Shoulder flexion 4 4 4+/4+ 5/5-  Shoulder extension      Shoulder abduction 4 3 4+/4 5/5-  Shoulder adduction      Shoulder extension      Shoulder internal rotation 5 5 5/5   Shoulder external rotation 5  5/4+ 5/5-        Hip flexion in sitting 4+ 3    Hip abd in sitting 4+ 4+    Knee extension 5 4-    Knee flexion 5 4    Wrist extension      Wrist ulnar deviation      Wrist radial deviation      Wrist pronation      Wrist supination      Grip strength   4+/4+ 5/5   (Blank rows = not tested)  SPECIAL TESTS:  Spurling's test: Negative SLUMP test + on left, inconclusive on Rt  FUNCTIONAL TESTS:  12/04/21:  5 time sit to stand: 26 seconds c UE support  TODAY'S TREATMENT:  01/25/2023 TherEx:  Sci fit bike L4 X 6 min UE/LE together Rows: 2 x 15  green band Shoulder extension 2 x 15 green band Bilat shoulder ER with scap retraction red band 2X15 Bilat shoulder horizontal abd with red band 2X10 Standing shoulder flexion 2 lbs 2x10 Standing shoulder abduction 2 lbs 2X10 Standing hip abd with red X 15 bilat (only 10 on the left due to increased pain)  Standing hip extension with red X 15 right only  Standing lumbar extension x10  Seated figure four piriformis stretch x3 hold for 30sec  Slump stretch x10 hold for 5 sec Nu step seat 8 level 6 x76mn Updated measurements   Pt defers heat  01/18/2023 TherEx:  Sci fit bike L2 X 6 min UE/LE together Rows: 2 x 15  green  band Shoulder extension 2 x 15 green band Bilat shoulder ER with scap retraction red band  2X10 Bilat shoulder horizontal abd with red band 2X10 Standing shoulder flexion 2 lbs x 10,  Standing shoulder abduction 2 lbs X 10 Standing bilat shoulder flexion going overhead with 2# bar, X 10 reps  Standing hip abd with red X 15 bilat Standing hip extension with red X 15 bilat Nu step (unable to perform today as machine was occupied  Pt defers heat  01/16/2023 TherEx:  Sci fit bike L2 X 6 min UE/LE together Rows: 2 x 15  green  band Shoulder extension 2 x 15 green band Bilat shoulder ER with scap retraction red band 2X10 Bilat shoulder horizontal abd with red band 2X10 Standing shoulder flexion 2 lbs x 10, 1 lb x 10  Standing shoulder abduction 2 lbs X 10, 1 lb X 10 Standing bilat shoulder flexion going overhead with 2# bar  Standing hip abd with red X 15 bilat Standing hip extension with red X 15 bilat Nu step L 6 X 5 min UE/LE   PATIENT EDUCATION: Education details: HEP, PT plan of care Person educated: Patient Education method: Explanation, Demonstration, Verbal cues, and Handouts Education comprehension:  verbalized understanding and needs further education   HOME EXERCISE PROGRAM: Access Code: [email or web address] URL: [email or web address] Date: 11/28/2022 Prepared by: Elsie Ra  Exercises - Seated Assisted Cervical Rotation with Towel  - 2 x daily - 6 x weekly - 1 sets - 10 reps - 5 hold - Seated Cervical Sidebending Stretch (Mirrored)  - 2 x daily - 6 x weekly - 1 sets - 3 reps - 20 sec hold - Standing Scapular Retraction with External Rotation  - 2 x daily - 6 x weekly - 1-2 sets - 10 reps - Slump Stretch  - 2 x daily - 6 x weekly - 1-2 sets - 10 reps - 3 hold - Seated Straight Leg Raise with Quad Contraction  - 2 x daily - 6 x weekly - 1-2 sets - 10 reps - Sit to Stand  - 2 x daily - 6 x weekly - 1-2 sets - 10 reps Added 01/18/23 -Hip abd with red band X 15 bilat 1 set per day -Hip ext with red band X 15 bilat 1 set per day  ASSESSMENT:  CLINICAL  IMPRESSION:  Her neck ROM has shown good progress up to this point and she is reporting little to no pain in her neck more recently. Her overall UE has improved as well, her left side is just slightly weaker than the right, but WFL. She said she is able to do more at home as well without neck pain. More recently she has noted problems in her left hip/back with reported sciatica so some exercises were added today to address this issue. She is coming towards the end of her plan of care and will continue to benefit from skilled PT these last couple weeks to help with pain in left side of LE and work toward transition to independent program.  OBJECTIVE IMPAIRMENTS: decreased activity tolerance, difficulty walking,  decreased endurance, decreased mobility, decreased ROM, decreased strength, impaired flexibility, impaired UE/LE use, postural dysfunction, and pain.  ACTIVITY LIMITATIONS: bending, lifting, carry, locomotion, cleaning, community activity, driving, and or occupation  PERSONAL FACTORS: [id number] 08/09/22, lumbar fusion, lumbar spinal stimulator, chronic pain, depression, fibromyalgia, osteoporosis are also affecting patient's functional outcome.  REHAB POTENTIAL: Good  CLINICAL DECISION MAKING: Evolving/moderate complexity  EVALUATION COMPLEXITY: Moderate    GOALS: Short term PT Goals Target date: 12/26/2022   Pt will be I and compliant with HEP. Baseline:  Goal status: MET 01/09/23 Pt will improve Left cervical rotation to 45 deg Baseline:30 Goal status: MET 01/09/23  Long term PT goals Target date:02/06/2023   Pt will neck left cervical rotation ROM to 60 deg to improve scanning for driving Baseline: Goal status: MET 01/09/23 Pt will improve  Left hip/knee strength to at least 4/5 MMT to improve functional strength Baseline:3 Goal status: ongoing Pt will improve FOTO to at least 54% functional to show improved function Baseline: Goal status: MET 01/09/23, improved to 57% Pt  will reduce pain by overall 50% overall with usual activity Baseline: Goal status: ongoing Pt will reduce pain to overall less than 2-3/10 with usual activity, work, and sleeping Baseline: Goal status: ongoing  PLAN: PT FREQUENCY: 1-2 times per week   PT DURATION: 6 weeks  PLANNED INTERVENTIONS (unless contraindicated): aquatic PT, Canalith repositioning, cryotherapy, Electrical stimulation, Iontophoresis with 4 mg/ml dexamethasome, Moist heat, traction, Ultrasound, gait training, Therapeutic exercise, balance training, neuromuscular re-education, patient/family education, prosthetic training, manual techniques, passive ROM, dry needling, taping, vasopnuematic device, vestibular, spinal manipulations, joint manipulations  PLAN FOR NEXT SESSION: shoulder/neck  and posture strengthening. How was visit with doctor? How were new stretches (piriformis, slump stretch)? Back and LE strengthening gradually without increased pain on left side, piriformis stretching.     History of fusions in neck/back  Tallen Schnorr, SPT 01/25/23 12:20 PM

## 2023-01-26 DIAGNOSIS — M4802 Spinal stenosis, cervical region: Secondary | ICD-10-CM | POA: Diagnosis not present

## 2023-01-26 DIAGNOSIS — M4712 Other spondylosis with myelopathy, cervical region: Secondary | ICD-10-CM | POA: Diagnosis not present

## 2023-01-26 DIAGNOSIS — M7062 Trochanteric bursitis, left hip: Secondary | ICD-10-CM | POA: Diagnosis not present

## 2023-01-26 DIAGNOSIS — M4322 Fusion of spine, cervical region: Secondary | ICD-10-CM | POA: Diagnosis not present

## 2023-01-29 ENCOUNTER — Other Ambulatory Visit: Payer: Self-pay | Admitting: Orthopedic Surgery

## 2023-01-29 DIAGNOSIS — M5416 Radiculopathy, lumbar region: Secondary | ICD-10-CM

## 2023-01-29 DIAGNOSIS — M4326 Fusion of spine, lumbar region: Secondary | ICD-10-CM

## 2023-01-30 ENCOUNTER — Encounter: Payer: Self-pay | Admitting: Rehabilitative and Restorative Service Providers"

## 2023-01-30 ENCOUNTER — Ambulatory Visit: Payer: Medicare Other | Admitting: Rehabilitative and Restorative Service Providers"

## 2023-01-30 DIAGNOSIS — M5459 Other low back pain: Secondary | ICD-10-CM | POA: Diagnosis not present

## 2023-01-30 DIAGNOSIS — M6281 Muscle weakness (generalized): Secondary | ICD-10-CM | POA: Diagnosis not present

## 2023-01-30 DIAGNOSIS — M542 Cervicalgia: Secondary | ICD-10-CM

## 2023-01-30 NOTE — Therapy (Addendum)
OUTPATIENT PHYSICAL THERAPY TREATMENT /DISCHARGE    Patient Name: Catherine Gardner MRN: [id number] DOB:December 06, 1960, 62 y.o., female Today's Date: 01/30/2023  END OF SESSION:  PT End of Session - 01/30/23 1326     Visit Number 15    Number of Visits 18    Date for PT Re-Evaluation 02/06/23    Authorization Type MCR - kx at 15    Progress Note Due on Visit 20    PT Start Time 1328    PT Stop Time 1353    PT Time Calculation (min) 25 min    Activity Tolerance Patient tolerated treatment well    Behavior During Therapy [facility] for tasks assessed/performed               Past Medical History:  Diagnosis Date   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   ANXIETY 12/27/2010   Arachnoiditis    ARTHRITIS 10/31/2007   Chronic lower back pain 10/28/2013   DEPRESSION 10/31/2007   FIBROMYALGIA 10/31/2007   Headache(784.0) 07/12/2009   HYPERLIPIDEMIA 10/31/2007   HYPERTENSION 10/31/2007   INSOMNIA 06/02/2010   LOW BACK PAIN 10/31/2007   Osteoporosis 10/31/2007   POLYARTHRALGIA 10/31/2007   RASH-NONVESICULAR 06/02/2010   SHINGLES 11/09/2009   SINUSITIS- ACUTE-NOS 12/20/2009   VITAMIN D DEFICIENCY 07/28/2009   Past Surgical History:  Procedure Laterality Date   ABDOMINAL HYSTERECTOMY     BREAST BIOPSY Left 02/26/2009   BREAST BIOPSY Left 01/03/2023   MM LT BREAST BX W LOC DEV 1ST LESION IMAGE BX SPEC STEREO GUIDE 01/03/2023 [facility]   implant     neuro spine stimulator   LUMBAR FUSION  2008   s/p diskectomy  2004   L5   s/p right foot morton's neuroma  Jan. 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   Memory loss  03/25/2015   Chronic pain syndrome 03/25/2015   Memory dysfunction 02/16/2015   Nonallopathic lesion of cervical region 12/25/2014   Trigger point of left shoulder region 12/05/2014   Whiplash injuries 12/04/2014   Acute hemorrhoid 10/04/2014   Fractured coccyx (HCC) 10/04/2014   Hormone replacement therapy 05/01/2014   Nonallopathic lesion of thoracic region 03/02/2014   Nonallopathic lesion of lumbosacral region 03/02/2014   Nonallopathic lesion of sacral region 03/02/2014   Trapezius muscle spasm 02/23/2014   Menopausal symptoms 10/28/2013   ANA positive 10/28/2013   Chronic lower back pain 10/28/2013   Varicose veins of lower extremities with other complications 05/15/2013   Varicose veins with pain 05/06/2013   Myalgia and myositis 04/30/2013   Abdominal pain, other specified site 09/16/2012   Polyarthralgia 09/16/2012   Palpitations 10/26/2011   Back pain 10/26/2011   Dyspepsia and other specified disorders of function of stomach 06/06/2011   Encounter for well adult exam with abnormal findings 02/27/2011   GERD (gastroesophageal reflux disease) 02/27/2011   ANXIETY 12/27/2010   INSOMNIA 06/02/2010   RASH-NONVESICULAR 06/02/2010   SHINGLES 11/09/2009   VITAMIN D DEFICIENCY 07/28/2009   Hyperlipidemia 10/31/2007   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   Depression 10/31/2007   Essential hypertension 10/31/2007   ARTHRITIS 10/31/2007   POLYARTHRALGIA 10/31/2007   LOW BACK PAIN 10/31/2007   Fibromyalgia 10/31/2007  Osteoporosis 10/31/2007    PCP: Lorenda Ishihara, MD   REFERRING PROVIDER: Adelfa Koh, MD  REFERRING DIAG: [phone] (ICD-10-CM) - Fusion of spine, cervical region Lumbar spine and left leg strengthening  THERAPY DIAG:  Cervicalgia  Other low back pain  Muscle weakness (generalized)  Rationale for Evaluation and Treatment: Rehabilitation  ONSET DATE: chronic neck and back pain, most recent surgery was neck fusion  08/09/22  SUBJECTIVE:                                                                                                                                                                                                         SUBJECTIVE STATEMENT: She said her neck feels good, but her left leg is causing her more trouble now with some of the exercises at home. She reports she feels more "weary" than pain and tired.   PERTINENT HISTORY:  YNW:GNFA 08/09/22, lumbar fusion, lumbar spinal stimulator, chronic pain, depression, fibromyalgia, osteoporosis  PAIN:  Pain scale:  Location: Lt cervical  PRECAUTIONS: Cervical/Lumbar Fusion  WEIGHT BEARING RESTRICTIONS: No  FALLS:  Has patient fallen in last 6 months? Yes. Number of falls 1, tripped on curb and fell 11/26/22 on knee caps  and forearms so some left shoulder pain since thi  OCCUPATION: work from home  PLOF: Independent  PATIENT GOALS: reduce pain, feel better, turn her head more to the left  NEXT MD VISIT: 12/01/21  OBJECTIVE:   DIAGNOSTIC FINDINGS:  Will have XR when she sees MD again 12/01/21  PATIENT SURVEYS:  01/30/2023 FOTO update 69  01/09/23: FOTO improved to 57% and met goal for this Eval: FOTO 52% functional, goal is 54%   COGNITION: Overall cognitive status: Within functional limits for tasks assessed  SENSATION: WFL  POSTURE: rounded shoulders  PALPATION: Eval: TTP in Cervical-thoracic-lumbar spine, in left shoulder and scapula, upper trap   CERVICAL ROM:   Active ROM AROM (deg) eval AROM 12/11/22 AROM 01/09/23 AROM 01/25/23  Flexion 30 42 42 60  Extension 20 35 40 32  Right lateral flexion 38  Left lateral flexion 35*  Right rotation 60 68 80 75  Left rotation 30 48 68 62   (Blank rows = not tested) *denotes pain     Lumbar ROM at EVAL Flexion: 50% Extension: 75% Rotation: WNL   UPPER EXTREMITY ROM:  Active ROM Right eval Left eval Right/Left 01/09/23   Shoulder flexion  120 110 140/140  Shoulder extension     Shoulder abduction 120 110 140/140  Shoulder adduction  Shoulder extension     Shoulder internal rotation WNL WNL   Shoulder external rotation WNL WNL   Elbow flexion     Elbow extension     Wrist flexion     Wrist extension     Wrist ulnar deviation     Wrist radial deviation     Wrist pronation     Wrist supination      (Blank rows = not tested)  UPPER/LOWER EXTREMITY MMT:  MMT Right eval Left eval Right/Left 01/09/23 Right/Left 01/25/2023  Shoulder flexion 4 4 4+/4+ 5/5-  Shoulder extension      Shoulder abduction 4 3 4+/4 5/5-  Shoulder adduction      Shoulder extension      Shoulder internal rotation 5 5 5/5   Shoulder external rotation 5  5/4+ 5/5-        Hip flexion in sitting 4+ 3    Hip abd in sitting 4+ 4+    Knee extension 5 4-    Knee flexion 5 4    Wrist extension      Wrist ulnar deviation      Wrist radial deviation      Wrist pronation      Wrist supination      Grip strength   4+/4+ 5/5   (Blank rows = not tested)  SPECIAL TESTS:  Spurling's test: Negative SLUMP test + on left, inconclusive on Rt  FUNCTIONAL TESTS:  12/04/21:  5 time sit to stand: 26 seconds c UE support  TODAY'S TREATMENT:  01/30/2023: Threrex: Nustep Lvl 6 10 mins UE/LE  Tband rows green band 2 x 15 bilateral Tband GH ext green band 2 x 15 bilateral Bilateral shoulder ER c scapular retraction 2 x 15 green band 1-2 second pause  Review of existing HEP c verbal cues.  Advice given on band progression based off resistance improvements.   01/25/2023 TherEx:  Sci fit bike L4 X 6 min UE/LE together Rows: 2 x 15  green band Shoulder extension 2 x 15 green band Bilat shoulder ER with scap retraction red band 2X15 Bilat shoulder horizontal abd with red band 2X10 Standing shoulder flexion 2 lbs 2x10 Standing shoulder abduction 2 lbs 2X10 Standing hip abd with red X 15 bilat (only 10 on the left due to increased pain)  Standing hip  extension with red X 15 right only  Standing lumbar extension x10  Seated figure four piriformis stretch x3 hold for 30sec  Slump stretch x10 hold for 5 sec Nu step seat 8 level 6 x71min Updated measurements   Pt defers heat  01/18/2023 TherEx:  Sci fit bike L2 X 6 min UE/LE together Rows: 2 x 15  green  band Shoulder extension 2 x 15 green band Bilat shoulder ER with scap retraction red band 2X10 Bilat shoulder horizontal abd with red band 2X10 Standing shoulder flexion 2 lbs x 10,  Standing shoulder abduction 2 lbs X 10 Standing bilat shoulder flexion going overhead with 2# bar, X 10 reps  Standing hip abd with red X 15 bilat Standing hip extension with red X 15 bilat Nu step (unable to perform today as machine was occupied  Pt defers heat  01/16/2023 TherEx:  Sci fit bike L2 X 6 min UE/LE together Rows: 2 x 15  green  band Shoulder extension 2 x 15 green band Bilat shoulder ER with scap retraction red band 2X10 Bilat shoulder horizontal abd with red band 2X10 Standing shoulder flexion 2 lbs x 10,  1 lb x 10  Standing shoulder abduction 2 lbs X 10, 1 lb X 10 Standing bilat shoulder flexion going overhead with 2# bar  Standing hip abd with red X 15 bilat Standing hip extension with red X 15 bilat Nu step L 6 X 5 min UE/LE   PATIENT EDUCATION: Education details: HEP, PT plan of care Person educated: Patient Education method: Explanation, Demonstration, Verbal cues, and Handouts Education comprehension: verbalized understanding and needs further education   HOME EXERCISE PROGRAM: Access Code: [email or web address] URL: [email or web address] Date: 11/28/2022 Prepared by: Ivery Quale  Exercises - Seated Assisted Cervical Rotation with Towel  - 2 x daily - 6 x weekly - 1 sets - 10 reps - 5 hold - Seated Cervical Sidebending Stretch (Mirrored)  - 2 x daily - 6 x weekly - 1 sets - 3 reps - 20 sec hold - Standing Scapular Retraction with External Rotation  - 2 x daily -  6 x weekly - 1-2 sets - 10 reps - Slump Stretch  - 2 x daily - 6 x weekly - 1-2 sets - 10 reps - 3 hold - Seated Straight Leg Raise with Quad Contraction  - 2 x daily - 6 x weekly - 1-2 sets - 10 reps - Sit to Stand  - 2 x daily - 6 x weekly - 1-2 sets - 10 reps Added 01/18/23 -Hip abd with red band X 15 bilat 1 set per day -Hip ext with red band X 15 bilat 1 set per day  ASSESSMENT:  CLINICAL IMPRESSION:  Pt has attended 15 visits overall during course of treatment.  Noted reduction in pain symptoms and daily limitations due to condition at this time.  Due to improvements noted in objective data and subjective reporting, recommended trial HEP at this time with patient in agreement.    OBJECTIVE IMPAIRMENTS: decreased activity tolerance, difficulty walking,  decreased endurance, decreased mobility, decreased ROM, decreased strength, impaired flexibility, impaired UE/LE use, postural dysfunction, and pain.  ACTIVITY LIMITATIONS: bending, lifting, carry, locomotion, cleaning, community activity, driving, and or occupation  PERSONAL FACTORS: [id number] 08/09/22, lumbar fusion, lumbar spinal stimulator, chronic pain, depression, fibromyalgia, osteoporosis are also affecting patient's functional outcome.  REHAB POTENTIAL: Good  CLINICAL DECISION MAKING: Evolving/moderate complexity  EVALUATION COMPLEXITY: Moderate    GOALS: Short term PT Goals Target date: 12/26/2022   Pt will be I and compliant with HEP. Baseline:  Goal status: MET 01/09/23 Pt will improve Left cervical rotation to 45 deg Baseline:30 Goal status: MET 01/09/23  Long term PT goals Target date:02/06/2023   Pt will neck left cervical rotation ROM to 60 deg to improve scanning for driving Baseline: Goal status: MET 01/09/23 Pt will improve  Left hip/knee strength to at least 4/5 MMT to improve functional strength Baseline:3 Goal status: Met Pt will improve FOTO to at least 54% functional to show improved  function Baseline: Goal status: MET 01/09/23, improved to 57% Pt will reduce pain by overall 50% overall with usual activity Baseline: Goal status: Met 01/30/2023 Pt will reduce pain to overall less than 2-3/10 with usual activity, work, and sleeping Baseline: Goal status: on going 01/30/2023  PLAN: PT FREQUENCY: 1-2 times per week   PT DURATION: 6 weeks  PLANNED INTERVENTIONS (unless contraindicated): aquatic PT, Canalith repositioning, cryotherapy, Electrical stimulation, Iontophoresis with 4 mg/ml dexamethasome, Moist heat, traction, Ultrasound, gait training, Therapeutic exercise, balance training, neuromuscular re-education, patient/family education, prosthetic training, manual techniques, passive ROM, dry needling, taping, vasopnuematic device, vestibular, spinal manipulations, joint manipulations  PLAN FOR NEXT SESSION: Trial HEP, discharge after inactivity > 30 days.    History of fusions in neck/back   Chyrel Masson, PT, DPT, OCS, ATC 01/30/23  1:58 PM   PHYSICAL THERAPY DISCHARGE SUMMARY  Visits from Start of Care: 15  Current functional level related to goals / functional outcomes: See note   Remaining deficits: See note   Education / Equipment: HEP  Patient goals were  mostly met . Patient is being discharged due to not returning since the last visit.  Chyrel Masson, PT, DPT, OCS, ATC 03/14/23  3:22 PM

## 2023-02-01 ENCOUNTER — Encounter: Payer: Medicare Other | Admitting: Rehabilitative and Restorative Service Providers"

## 2023-02-05 ENCOUNTER — Ambulatory Visit
Admission: RE | Admit: 2023-02-05 | Discharge: 2023-02-05 | Disposition: A | Discharge status: Home / Self Care | Payer: Medicare Other | Source: Ambulatory Visit | Attending: Orthopedic Surgery | Admitting: Orthopedic Surgery

## 2023-02-05 DIAGNOSIS — M4326 Fusion of spine, lumbar region: Secondary | ICD-10-CM

## 2023-02-05 DIAGNOSIS — M545 Low back pain, unspecified: Secondary | ICD-10-CM | POA: Diagnosis not present

## 2023-02-05 DIAGNOSIS — M47816 Spondylosis without myelopathy or radiculopathy, lumbar region: Secondary | ICD-10-CM | POA: Diagnosis not present

## 2023-02-05 DIAGNOSIS — M5416 Radiculopathy, lumbar region: Secondary | ICD-10-CM

## 2023-02-06 ENCOUNTER — Encounter: Payer: Medicare Other | Admitting: Rehabilitative and Restorative Service Providers"

## 2023-02-08 ENCOUNTER — Encounter: Payer: Medicare Other | Admitting: Rehabilitative and Restorative Service Providers"

## 2023-02-13 ENCOUNTER — Other Ambulatory Visit: Payer: Medicare Other

## 2023-02-16 DIAGNOSIS — M5416 Radiculopathy, lumbar region: Secondary | ICD-10-CM | POA: Diagnosis not present

## 2023-02-16 DIAGNOSIS — G894 Chronic pain syndrome: Secondary | ICD-10-CM | POA: Diagnosis not present

## 2023-04-04 DIAGNOSIS — I10 Essential (primary) hypertension: Secondary | ICD-10-CM | POA: Diagnosis not present

## 2023-04-12 DIAGNOSIS — M79605 Pain in left leg: Secondary | ICD-10-CM | POA: Diagnosis not present

## 2023-04-20 DIAGNOSIS — M7061 Trochanteric bursitis, right hip: Secondary | ICD-10-CM | POA: Diagnosis not present

## 2023-04-20 DIAGNOSIS — M5432 Sciatica, left side: Secondary | ICD-10-CM | POA: Diagnosis not present

## 2023-04-20 DIAGNOSIS — G039 Meningitis, unspecified: Secondary | ICD-10-CM | POA: Diagnosis not present

## 2023-04-25 DIAGNOSIS — M1612 Unilateral primary osteoarthritis, left hip: Secondary | ICD-10-CM | POA: Diagnosis not present

## 2023-04-25 DIAGNOSIS — M25552 Pain in left hip: Secondary | ICD-10-CM | POA: Diagnosis not present

## 2023-04-26 DIAGNOSIS — I10 Essential (primary) hypertension: Secondary | ICD-10-CM | POA: Diagnosis not present

## 2023-04-27 DIAGNOSIS — M1612 Unilateral primary osteoarthritis, left hip: Secondary | ICD-10-CM | POA: Diagnosis not present

## 2023-05-11 DIAGNOSIS — L0390 Cellulitis, unspecified: Secondary | ICD-10-CM | POA: Diagnosis not present

## 2023-05-11 DIAGNOSIS — L723 Sebaceous cyst: Secondary | ICD-10-CM | POA: Diagnosis not present

## 2023-05-11 DIAGNOSIS — I10 Essential (primary) hypertension: Secondary | ICD-10-CM | POA: Diagnosis not present

## 2023-05-15 DIAGNOSIS — I10 Essential (primary) hypertension: Secondary | ICD-10-CM | POA: Diagnosis not present

## 2023-05-22 DIAGNOSIS — M1612 Unilateral primary osteoarthritis, left hip: Secondary | ICD-10-CM | POA: Diagnosis not present

## 2023-05-25 DIAGNOSIS — Z1231 Encounter for screening mammogram for malignant neoplasm of breast: Secondary | ICD-10-CM | POA: Diagnosis not present

## 2023-05-25 DIAGNOSIS — Z1211 Encounter for screening for malignant neoplasm of colon: Secondary | ICD-10-CM | POA: Diagnosis not present

## 2023-05-25 DIAGNOSIS — M791 Myalgia, unspecified site: Secondary | ICD-10-CM | POA: Diagnosis not present

## 2023-05-25 DIAGNOSIS — Z789 Other specified health status: Secondary | ICD-10-CM | POA: Diagnosis not present

## 2023-05-25 DIAGNOSIS — Z0000 Encounter for general adult medical examination without abnormal findings: Secondary | ICD-10-CM | POA: Diagnosis not present

## 2023-05-25 DIAGNOSIS — I10 Essential (primary) hypertension: Secondary | ICD-10-CM | POA: Diagnosis not present

## 2023-05-25 DIAGNOSIS — Z1389 Encounter for screening for other disorder: Secondary | ICD-10-CM | POA: Diagnosis not present

## 2023-05-25 DIAGNOSIS — M810 Age-related osteoporosis without current pathological fracture: Secondary | ICD-10-CM | POA: Diagnosis not present

## 2023-05-25 DIAGNOSIS — Z8616 Personal history of COVID-19: Secondary | ICD-10-CM | POA: Diagnosis not present

## 2023-06-15 DIAGNOSIS — M25552 Pain in left hip: Secondary | ICD-10-CM | POA: Diagnosis not present

## 2023-06-20 ENCOUNTER — Ambulatory Visit: Payer: Medicare Other | Admitting: Orthopaedic Surgery

## 2023-06-22 IMAGING — MG MM DIGITAL SCREENING BILAT W/ TOMO AND CAD
8 series · 8 of 24 positions shown · non-contrast
Comparison: Previous exam(s).

CLINICAL DATA: Screening.

EXAM:
DIGITAL SCREENING BILATERAL MAMMOGRAM WITH TOMOSYNTHESIS AND CAD
TECHNIQUE: Bilateral screening digital craniocaudal and mediolateral oblique
mammograms were obtained. Bilateral screening digital breast
tomosynthesis was performed. The images were evaluated with
computer-aided detection.

[R MLO synth-2D]
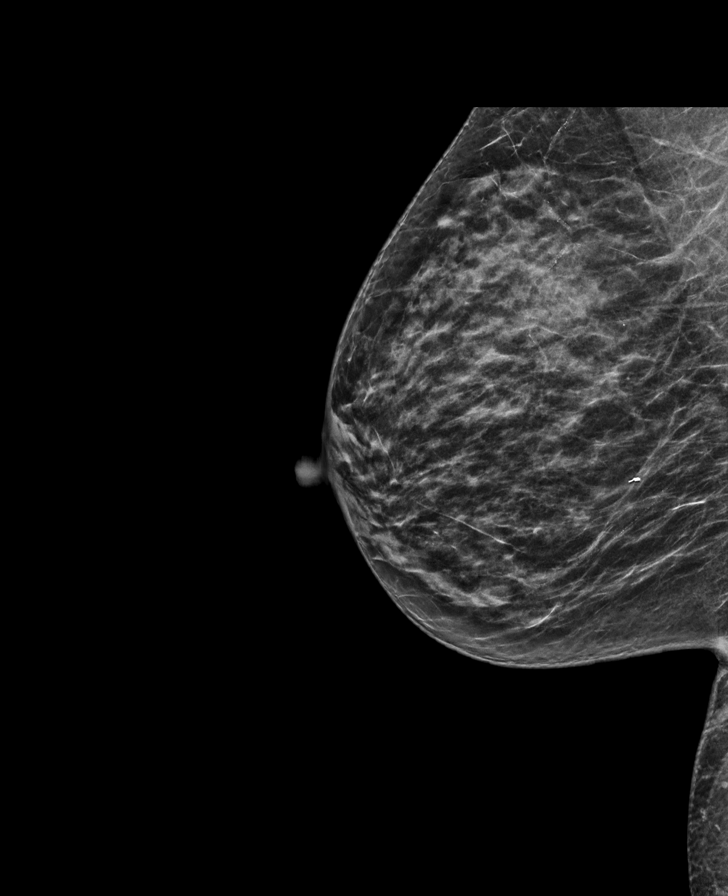

[L MLO synth-2D]
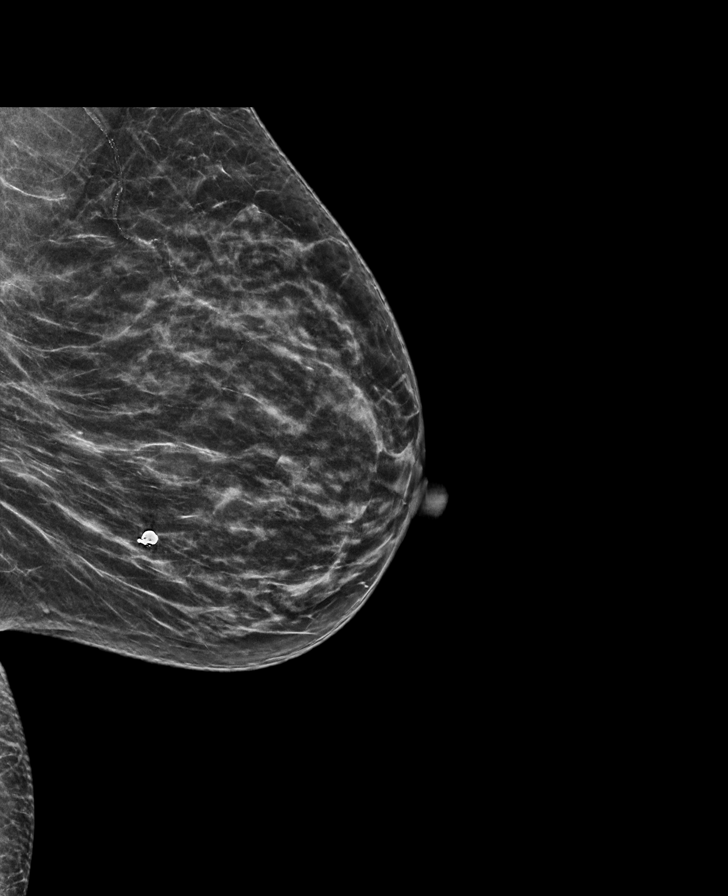

[R CC synth-2D]
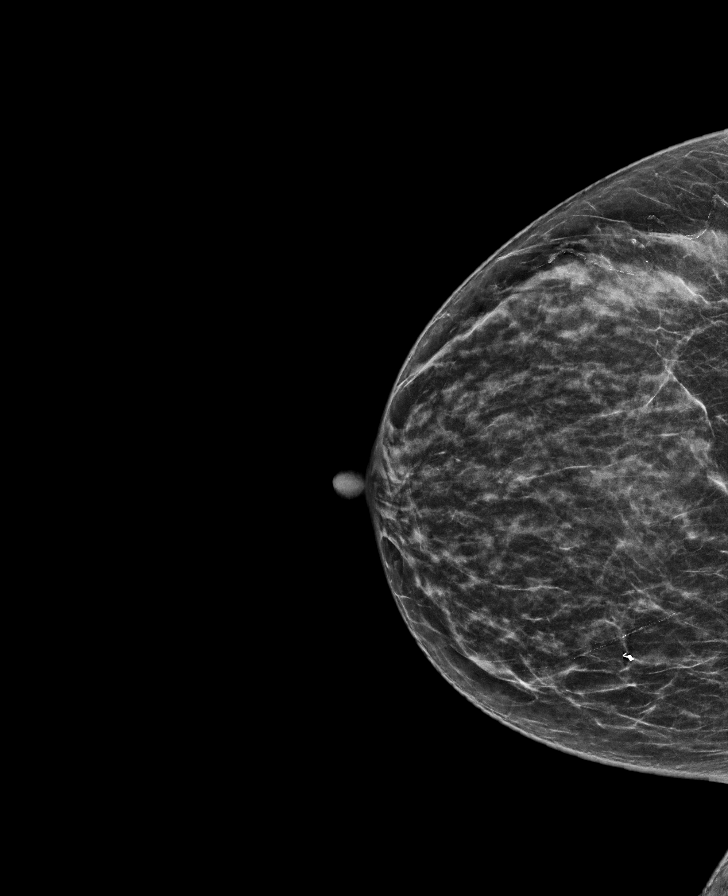

[L CC synth-2D]
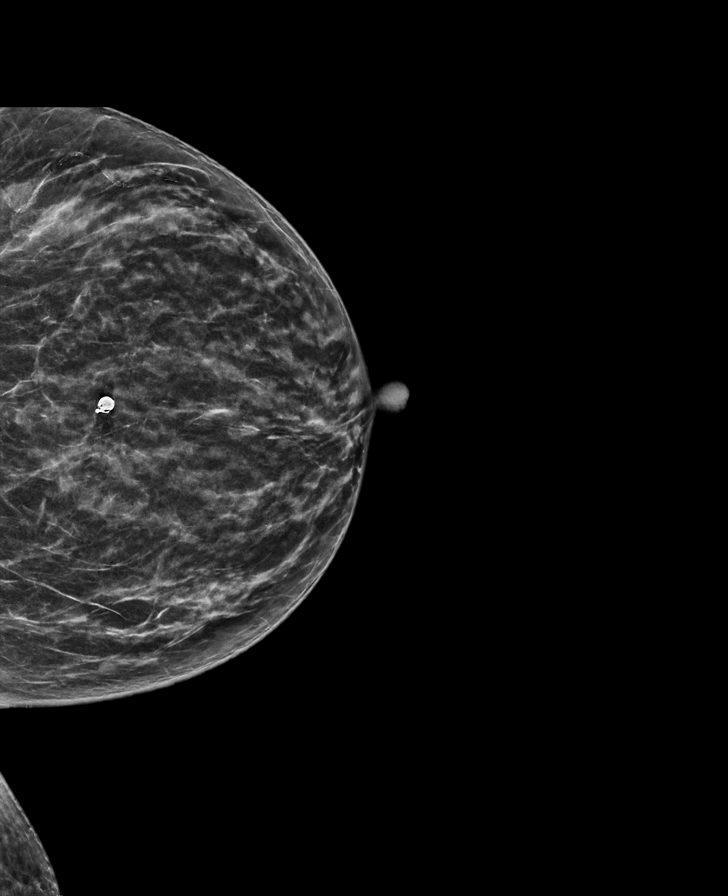

[L CC tomo · tomo slice 27/54.0]
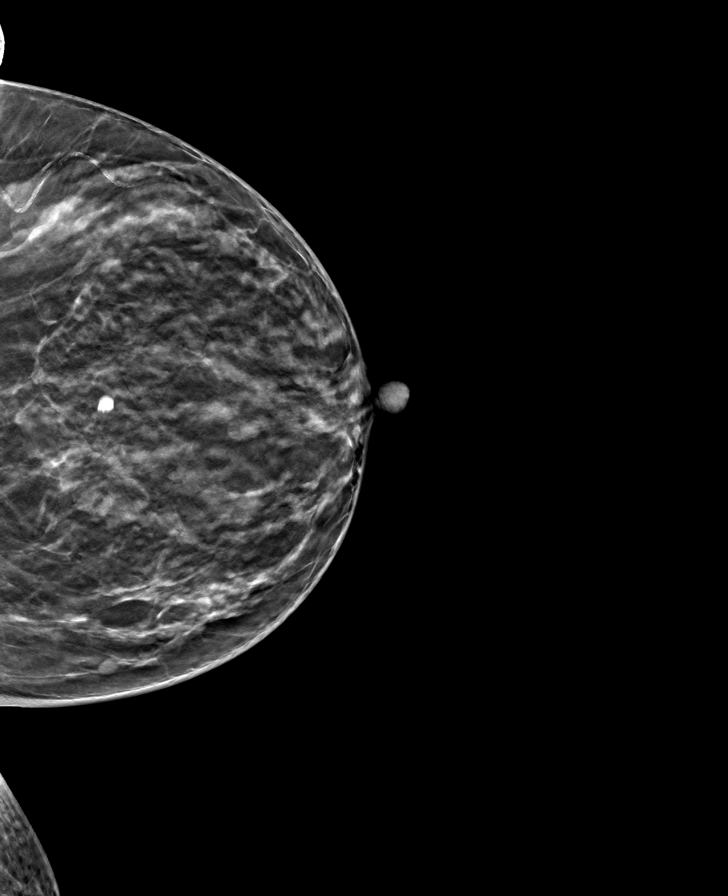

[R CC tomo · tomo slice 27/54.0]
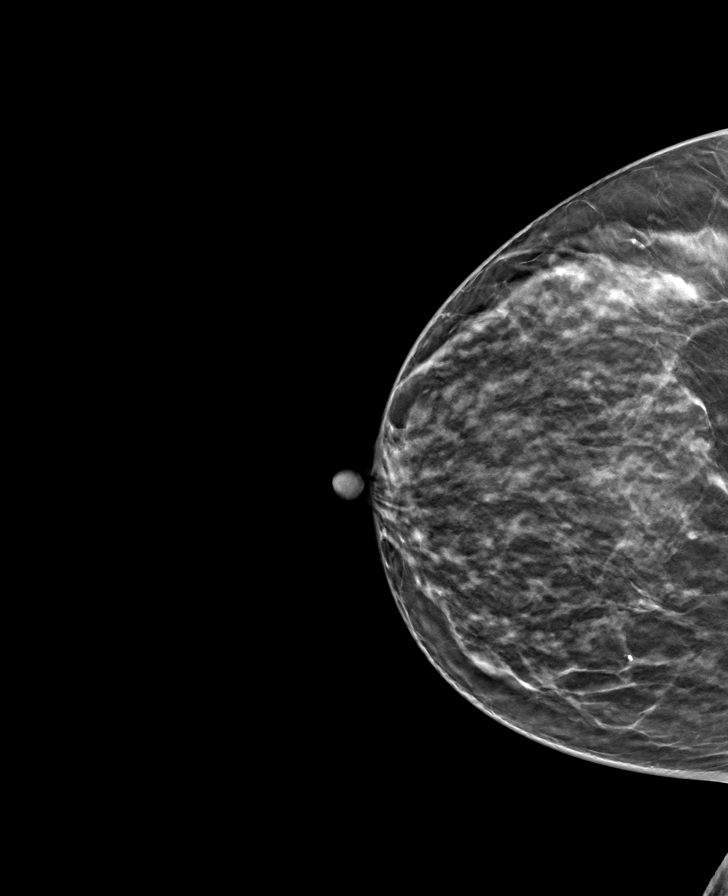

[L MLO tomo · tomo slice 29/57.0]
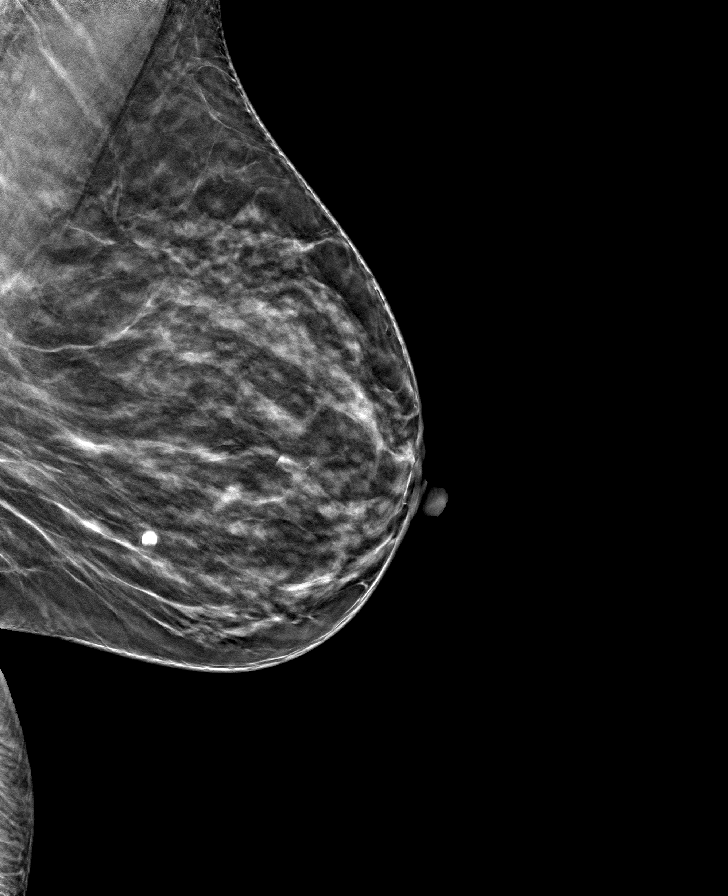

[R MLO tomo · tomo slice 27/54.0]
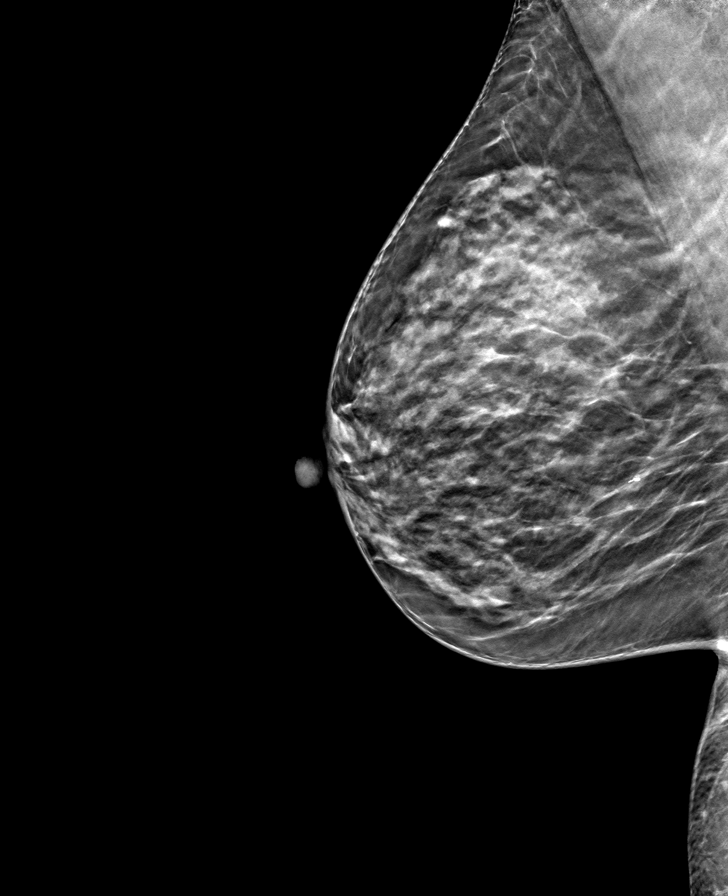

[8 of 24 positions shown; findings below may reference images not displayed]

ACR Breast Density Category c: The breast tissue is heterogeneously
dense, which may obscure small masses.
FINDINGS: There are no findings suspicious for malignancy.
IMPRESSION: No mammographic evidence of malignancy. A result letter of this
screening mammogram will be mailed directly to the patient.

RECOMMENDATION:
Screening mammogram in one year. (Code:[id number]-W-BC3)

BI-RADS CATEGORY  1: Negative.

## 2023-07-06 DIAGNOSIS — M25552 Pain in left hip: Secondary | ICD-10-CM | POA: Diagnosis not present

## 2023-11-22 ENCOUNTER — Other Ambulatory Visit: Payer: Self-pay | Admitting: Internal Medicine

## 2023-11-22 DIAGNOSIS — Z1231 Encounter for screening mammogram for malignant neoplasm of breast: Secondary | ICD-10-CM

## 2023-12-11 DIAGNOSIS — E559 Vitamin D deficiency, unspecified: Secondary | ICD-10-CM | POA: Diagnosis not present

## 2023-12-11 DIAGNOSIS — M810 Age-related osteoporosis without current pathological fracture: Secondary | ICD-10-CM | POA: Diagnosis not present

## 2023-12-11 DIAGNOSIS — I10 Essential (primary) hypertension: Secondary | ICD-10-CM | POA: Diagnosis not present

## 2023-12-11 DIAGNOSIS — E049 Nontoxic goiter, unspecified: Secondary | ICD-10-CM | POA: Diagnosis not present

## 2023-12-11 DIAGNOSIS — M791 Myalgia, unspecified site: Secondary | ICD-10-CM | POA: Diagnosis not present

## 2023-12-21 DIAGNOSIS — R059 Cough, unspecified: Secondary | ICD-10-CM | POA: Diagnosis not present

## 2023-12-24 ENCOUNTER — Ambulatory Visit: Payer: Medicare Other

## 2023-12-30 IMAGING — DX DG THORACIC SPINE 3V
3 series · 3 of 3 positions shown · non-contrast
Comparison: CT, 06/07/2015.

CLINICAL DATA: Chronic thoracic back pain.

EXAM:
THORACIC SPINE - 3 VIEWS

[dg thoracic spine w/swimmers (1 of 3)]
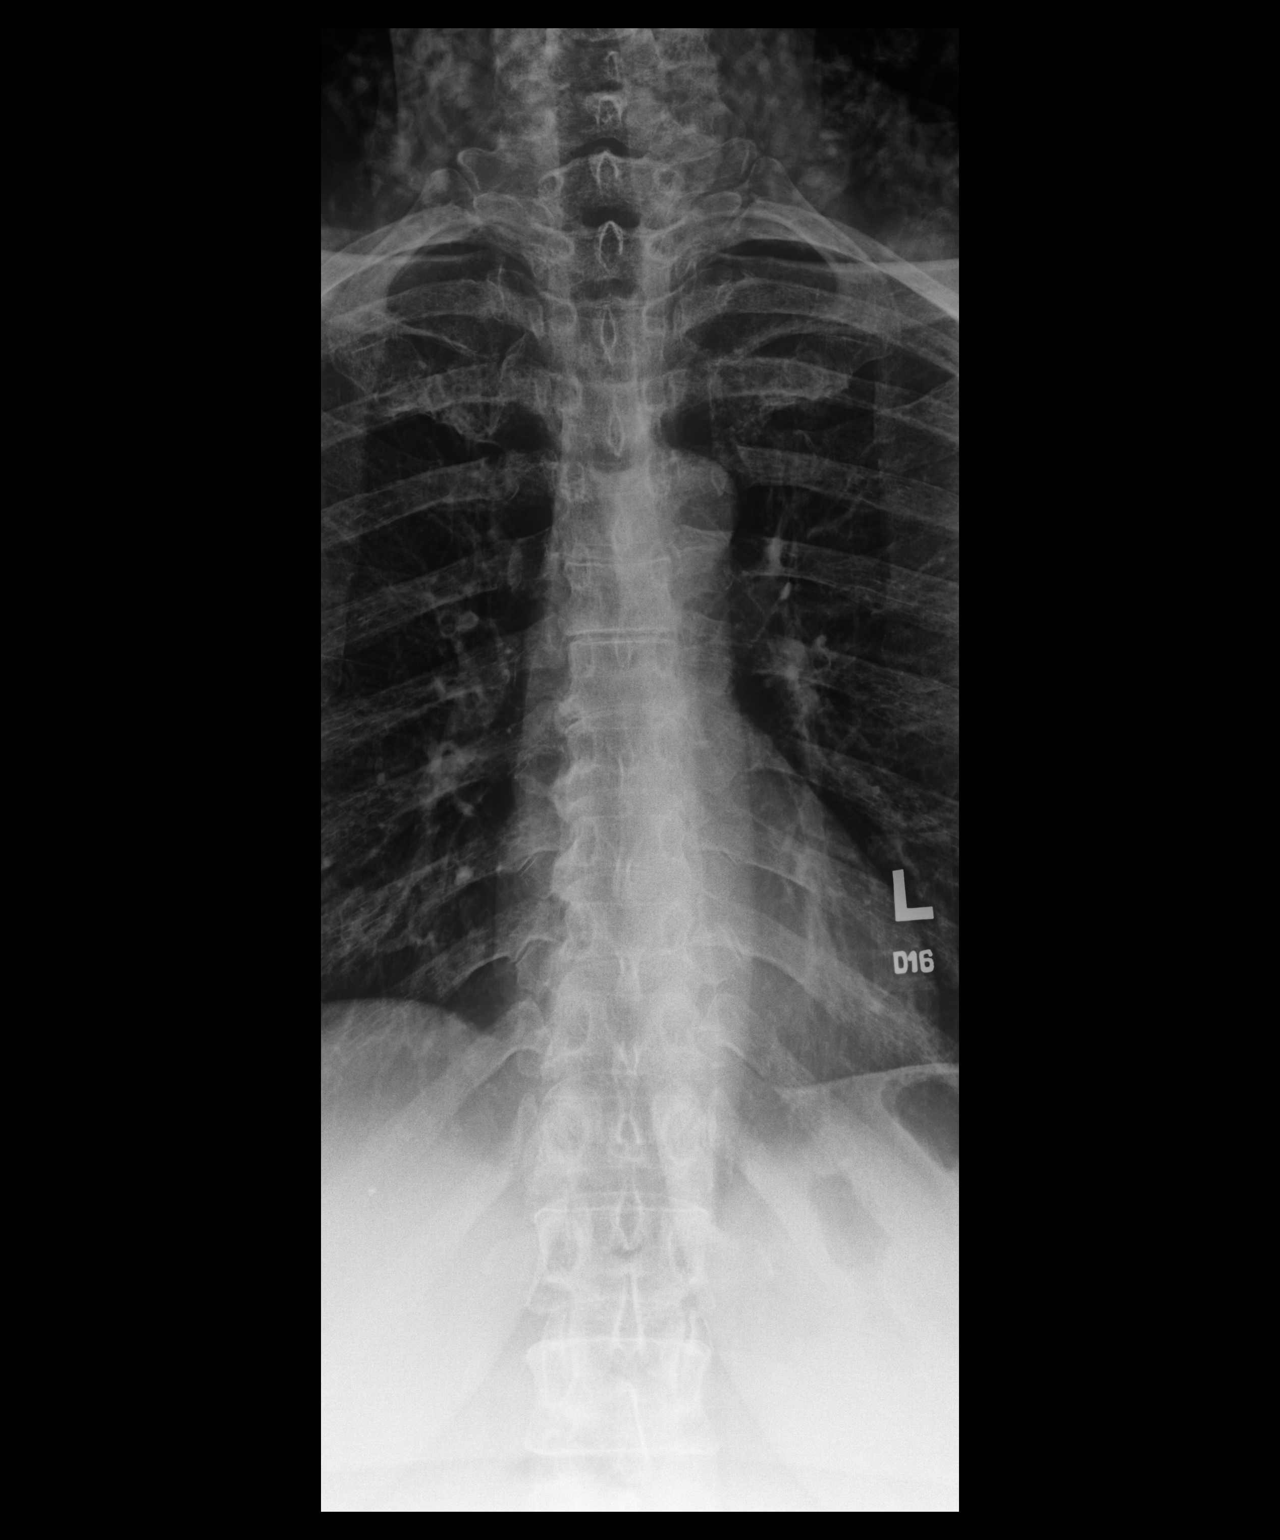

[dg thoracic spine w/swimmers (2 of 3)]
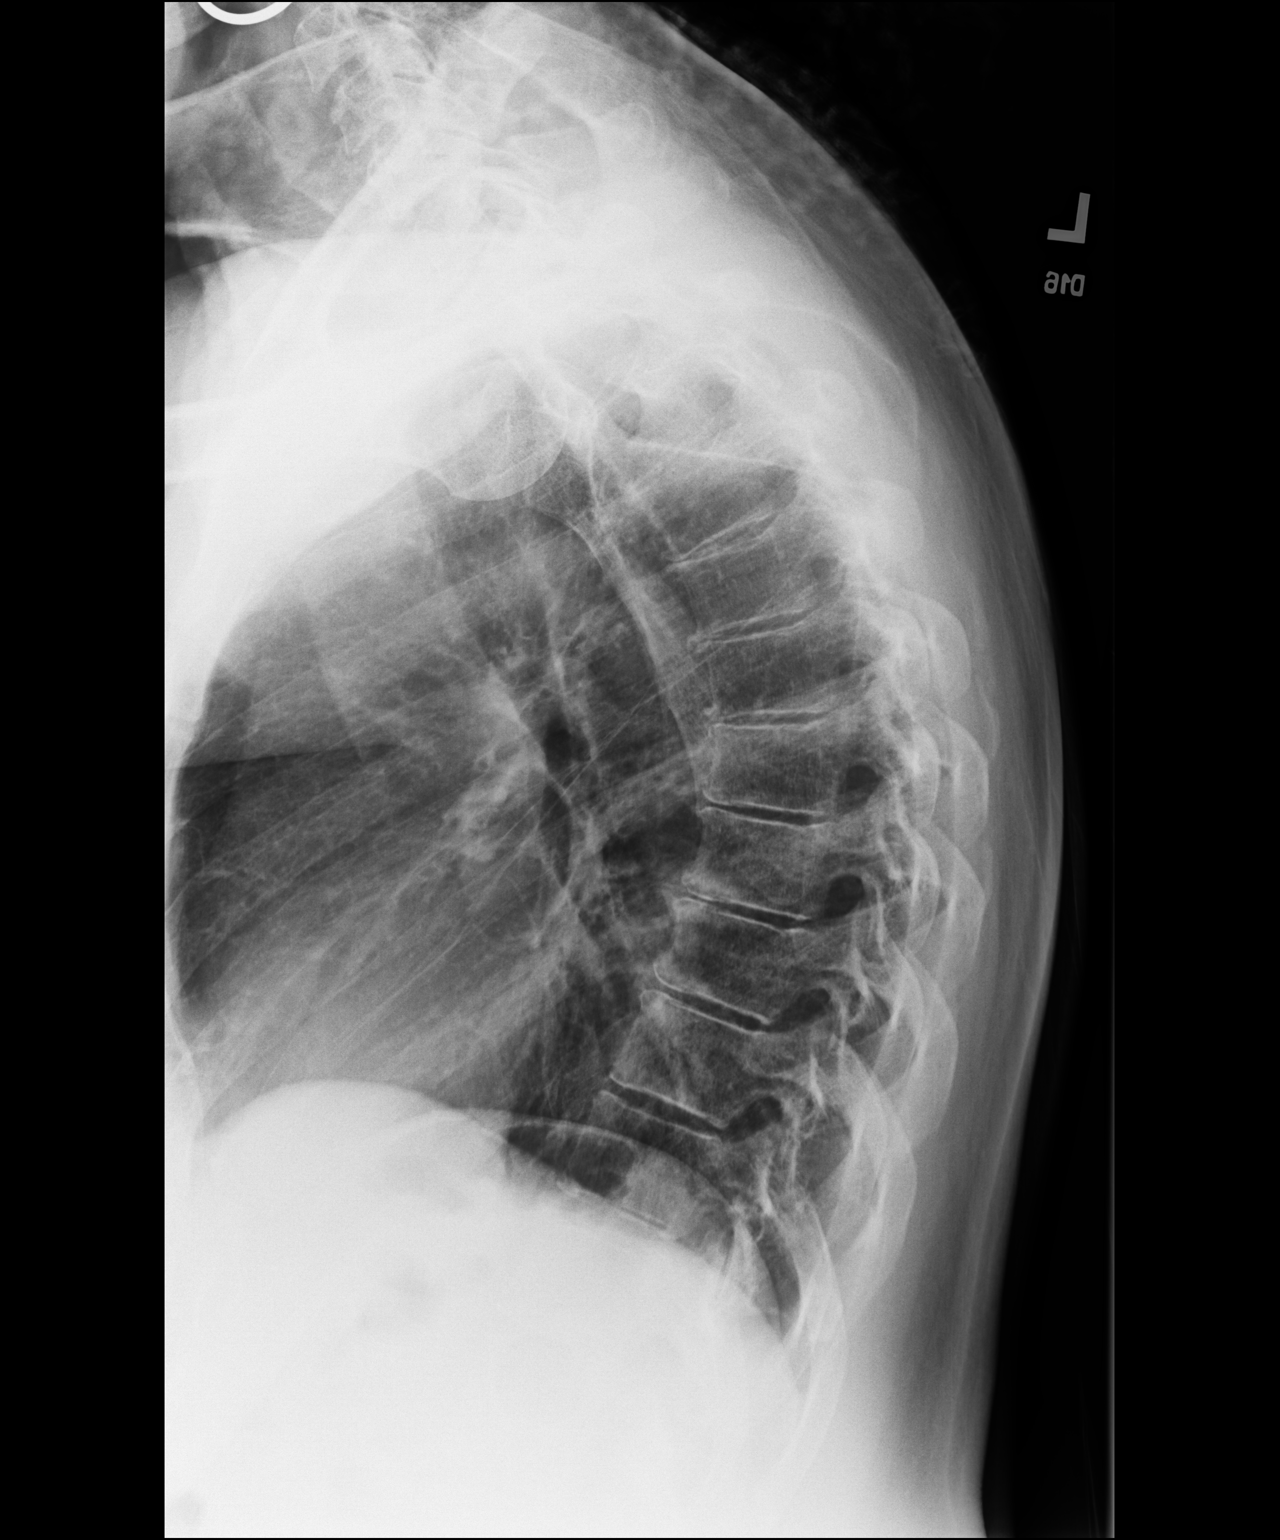

[dg thoracic spine w/swimmers (3 of 3)]
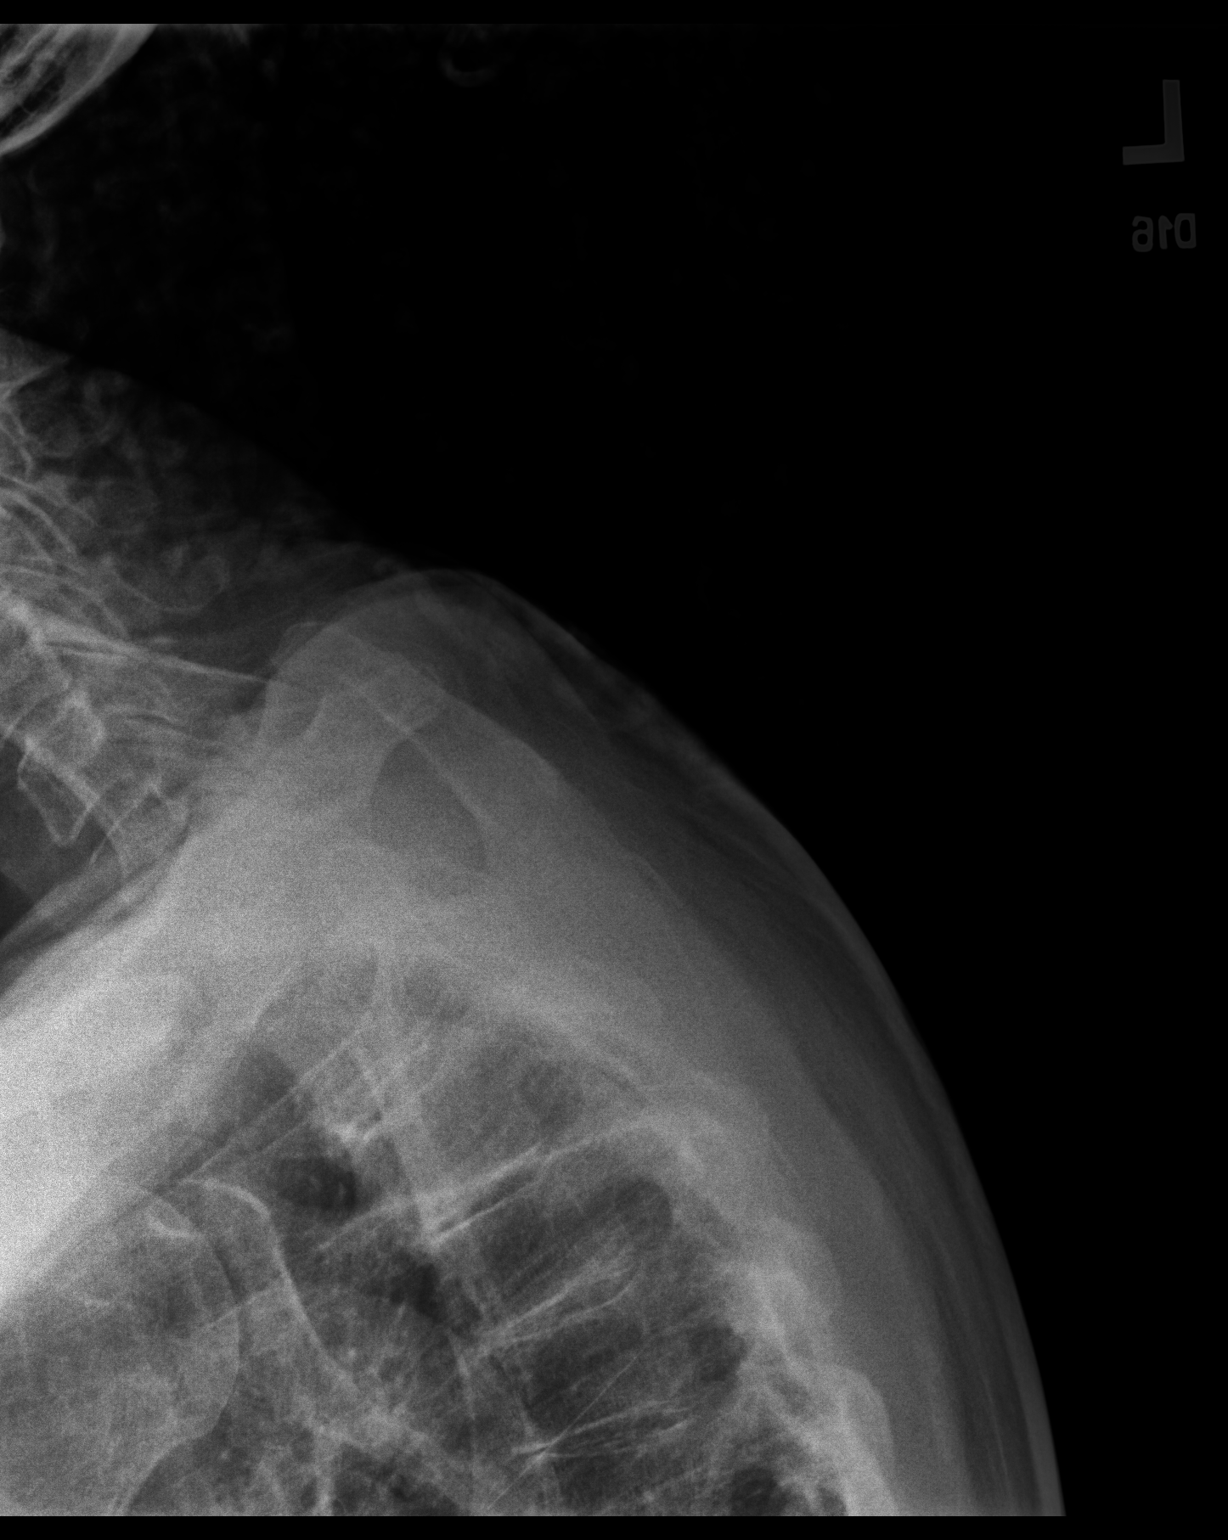

[3 of 3 positions shown; findings below may reference images not displayed]

FINDINGS: No fracture, bone lesion or spondylolisthesis.

Moderate loss of disc height go with small anterior endplate
osteophytes, most evident along the midthoracic spine, increased
when compared to the prior CT.

Soft tissues are unremarkable.
IMPRESSION: 1. No fracture or acute finding.  No spondylolisthesis.
2. Moderate disc degenerative changes.

## 2024-01-07 ENCOUNTER — Ambulatory Visit
Admission: RE | Admit: 2024-01-07 | Discharge: 2024-01-07 | Disposition: A | Discharge status: Home / Self Care | Payer: Medicare Other | Source: Ambulatory Visit | Attending: Internal Medicine | Admitting: Internal Medicine

## 2024-01-07 DIAGNOSIS — Z1231 Encounter for screening mammogram for malignant neoplasm of breast: Secondary | ICD-10-CM

## 2024-01-14 DIAGNOSIS — M810 Age-related osteoporosis without current pathological fracture: Secondary | ICD-10-CM | POA: Diagnosis not present

## 2024-02-26 DIAGNOSIS — K219 Gastro-esophageal reflux disease without esophagitis: Secondary | ICD-10-CM | POA: Diagnosis not present

## 2024-02-26 DIAGNOSIS — I10 Essential (primary) hypertension: Secondary | ICD-10-CM | POA: Diagnosis not present

## 2024-03-19 ENCOUNTER — Other Ambulatory Visit: Payer: Self-pay | Admitting: Student

## 2024-03-19 DIAGNOSIS — Z860101 Personal history of adenomatous and serrated colon polyps: Secondary | ICD-10-CM | POA: Diagnosis not present

## 2024-03-19 DIAGNOSIS — K219 Gastro-esophageal reflux disease without esophagitis: Secondary | ICD-10-CM | POA: Diagnosis not present

## 2024-03-19 DIAGNOSIS — K5900 Constipation, unspecified: Secondary | ICD-10-CM | POA: Diagnosis not present

## 2024-03-19 DIAGNOSIS — K449 Diaphragmatic hernia without obstruction or gangrene: Secondary | ICD-10-CM

## 2024-03-21 ENCOUNTER — Ambulatory Visit
Admission: RE | Admit: 2024-03-21 | Discharge: 2024-03-21 | Disposition: A | Discharge status: Home / Self Care | Source: Ambulatory Visit | Attending: Student | Admitting: Student

## 2024-03-21 DIAGNOSIS — K449 Diaphragmatic hernia without obstruction or gangrene: Secondary | ICD-10-CM

## 2024-03-21 MED ORDER — IOPAMIDOL (ISOVUE-300) INJECTION 61%
100.0000 mL | Freq: Once | INTRAVENOUS | Status: AC | PRN
  Administered 2024-03-21: 100 mL via INTRAVENOUS

## 2024-04-15 DIAGNOSIS — M797 Fibromyalgia: Secondary | ICD-10-CM | POA: Diagnosis not present

## 2024-04-15 DIAGNOSIS — I10 Essential (primary) hypertension: Secondary | ICD-10-CM | POA: Diagnosis not present

## 2024-04-15 DIAGNOSIS — E785 Hyperlipidemia, unspecified: Secondary | ICD-10-CM | POA: Diagnosis not present

## 2024-04-15 DIAGNOSIS — K219 Gastro-esophageal reflux disease without esophagitis: Secondary | ICD-10-CM | POA: Diagnosis not present

## 2024-06-02 DIAGNOSIS — Z136 Encounter for screening for cardiovascular disorders: Secondary | ICD-10-CM | POA: Diagnosis not present

## 2024-06-02 DIAGNOSIS — M47812 Spondylosis without myelopathy or radiculopathy, cervical region: Secondary | ICD-10-CM | POA: Diagnosis not present

## 2024-06-02 DIAGNOSIS — G894 Chronic pain syndrome: Secondary | ICD-10-CM | POA: Diagnosis not present

## 2024-06-02 DIAGNOSIS — M810 Age-related osteoporosis without current pathological fracture: Secondary | ICD-10-CM | POA: Diagnosis not present

## 2024-06-02 DIAGNOSIS — E785 Hyperlipidemia, unspecified: Secondary | ICD-10-CM | POA: Diagnosis not present

## 2024-06-02 DIAGNOSIS — I10 Essential (primary) hypertension: Secondary | ICD-10-CM | POA: Diagnosis not present

## 2024-06-02 DIAGNOSIS — K219 Gastro-esophageal reflux disease without esophagitis: Secondary | ICD-10-CM | POA: Diagnosis not present

## 2024-06-02 DIAGNOSIS — R202 Paresthesia of skin: Secondary | ICD-10-CM | POA: Diagnosis not present

## 2024-06-02 DIAGNOSIS — Z0000 Encounter for general adult medical examination without abnormal findings: Secondary | ICD-10-CM | POA: Diagnosis not present

## 2024-06-02 DIAGNOSIS — E559 Vitamin D deficiency, unspecified: Secondary | ICD-10-CM | POA: Diagnosis not present

## 2024-06-18 DIAGNOSIS — K635 Polyp of colon: Secondary | ICD-10-CM | POA: Diagnosis not present

## 2024-06-18 DIAGNOSIS — R1013 Epigastric pain: Secondary | ICD-10-CM | POA: Diagnosis not present

## 2024-06-18 DIAGNOSIS — Z860101 Personal history of adenomatous and serrated colon polyps: Secondary | ICD-10-CM | POA: Diagnosis not present

## 2024-06-18 DIAGNOSIS — K3189 Other diseases of stomach and duodenum: Secondary | ICD-10-CM | POA: Diagnosis not present

## 2024-06-18 DIAGNOSIS — Z09 Encounter for follow-up examination after completed treatment for conditions other than malignant neoplasm: Secondary | ICD-10-CM | POA: Diagnosis not present

## 2024-06-18 DIAGNOSIS — R1310 Dysphagia, unspecified: Secondary | ICD-10-CM | POA: Diagnosis not present

## 2024-06-18 DIAGNOSIS — K2930 Chronic superficial gastritis without bleeding: Secondary | ICD-10-CM | POA: Diagnosis not present

## 2024-06-18 DIAGNOSIS — K219 Gastro-esophageal reflux disease without esophagitis: Secondary | ICD-10-CM | POA: Diagnosis not present

## 2024-06-18 DIAGNOSIS — R140 Abdominal distension (gaseous): Secondary | ICD-10-CM | POA: Diagnosis not present

## 2024-08-06 DIAGNOSIS — M25561 Pain in right knee: Secondary | ICD-10-CM | POA: Diagnosis not present

## 2024-08-06 DIAGNOSIS — G8929 Other chronic pain: Secondary | ICD-10-CM | POA: Diagnosis not present

## 2024-08-06 DIAGNOSIS — M25562 Pain in left knee: Secondary | ICD-10-CM | POA: Diagnosis not present

## 2024-08-22 DIAGNOSIS — E785 Hyperlipidemia, unspecified: Secondary | ICD-10-CM | POA: Diagnosis not present

## 2024-08-27 DIAGNOSIS — M1712 Unilateral primary osteoarthritis, left knee: Secondary | ICD-10-CM | POA: Diagnosis not present

## 2024-09-16 DIAGNOSIS — M25552 Pain in left hip: Secondary | ICD-10-CM | POA: Diagnosis not present

## 2024-09-16 DIAGNOSIS — M479 Spondylosis, unspecified: Secondary | ICD-10-CM | POA: Diagnosis not present

## 2024-09-16 DIAGNOSIS — M25562 Pain in left knee: Secondary | ICD-10-CM | POA: Diagnosis not present

## 2024-09-23 DIAGNOSIS — M479 Spondylosis, unspecified: Secondary | ICD-10-CM | POA: Diagnosis not present

## 2024-09-23 DIAGNOSIS — M25562 Pain in left knee: Secondary | ICD-10-CM | POA: Diagnosis not present

## 2024-09-23 DIAGNOSIS — M25552 Pain in left hip: Secondary | ICD-10-CM | POA: Diagnosis not present

## 2024-10-13 ENCOUNTER — Other Ambulatory Visit: Payer: Self-pay | Admitting: Endocrinology

## 2024-10-13 DIAGNOSIS — E010 Iodine-deficiency related diffuse (endemic) goiter: Secondary | ICD-10-CM

## 2024-10-20 ENCOUNTER — Ambulatory Visit
Admission: RE | Admit: 2024-10-20 | Discharge: 2024-10-20 | Disposition: A | Source: Ambulatory Visit | Attending: Endocrinology | Admitting: Endocrinology

## 2024-10-20 DIAGNOSIS — E010 Iodine-deficiency related diffuse (endemic) goiter: Secondary | ICD-10-CM

## 2024-12-26 ENCOUNTER — Other Ambulatory Visit: Payer: Self-pay | Admitting: Internal Medicine

## 2024-12-26 DIAGNOSIS — Z1231 Encounter for screening mammogram for malignant neoplasm of breast: Secondary | ICD-10-CM

## 2025-01-12 ENCOUNTER — Ambulatory Visit
